# Patient Record
Sex: Male | Born: 1955 | Race: White | Hispanic: No | Marital: Married | Smoking: Former smoker
Health system: Southern US, Community
[De-identification: ages and names within clinical notes are randomized; demographics above are authoritative.]

## PROBLEM LIST (undated history)

## (undated) DIAGNOSIS — E119 Type 2 diabetes mellitus without complications: Secondary | ICD-10-CM

## (undated) DIAGNOSIS — G4730 Sleep apnea, unspecified: Secondary | ICD-10-CM

## (undated) DIAGNOSIS — F319 Bipolar disorder, unspecified: Secondary | ICD-10-CM

## (undated) DIAGNOSIS — F329 Major depressive disorder, single episode, unspecified: Secondary | ICD-10-CM

## (undated) DIAGNOSIS — F32A Depression, unspecified: Secondary | ICD-10-CM

## (undated) DIAGNOSIS — I499 Cardiac arrhythmia, unspecified: Secondary | ICD-10-CM

## (undated) DIAGNOSIS — E039 Hypothyroidism, unspecified: Secondary | ICD-10-CM

## (undated) DIAGNOSIS — G4733 Obstructive sleep apnea (adult) (pediatric): Secondary | ICD-10-CM

## (undated) DIAGNOSIS — I517 Cardiomegaly: Secondary | ICD-10-CM

## (undated) DIAGNOSIS — I10 Essential (primary) hypertension: Secondary | ICD-10-CM

## (undated) DIAGNOSIS — K7581 Nonalcoholic steatohepatitis (NASH): Secondary | ICD-10-CM

## (undated) DIAGNOSIS — E785 Hyperlipidemia, unspecified: Secondary | ICD-10-CM

## (undated) HISTORY — PX: APPENDECTOMY: SHX54

## (undated) HISTORY — PX: KNEE SURGERY: SHX244

## (undated) HISTORY — PX: COLONOSCOPY: SHX174

---

## 1995-07-12 HISTORY — PX: UVULOPALATOPHARYNGOPLASTY: SHX827

## 2002-03-21 HISTORY — PX: ESOPHAGOGASTRODUODENOSCOPY: SHX1529

## 2003-09-26 ENCOUNTER — Ambulatory Visit (HOSPITAL_COMMUNITY): Admission: RE | Admit: 2003-09-26 | Discharge: 2003-09-26 | Payer: Self-pay | Admitting: Gastroenterology

## 2005-08-27 ENCOUNTER — Inpatient Hospital Stay (HOSPITAL_COMMUNITY): Admission: EM | Admit: 2005-08-27 | Discharge: 2005-09-02 | Payer: Self-pay | Admitting: Psychiatry

## 2005-08-28 ENCOUNTER — Ambulatory Visit: Payer: Self-pay | Admitting: Psychiatry

## 2006-12-25 ENCOUNTER — Ambulatory Visit: Payer: Self-pay | Admitting: Internal Medicine

## 2009-08-04 ENCOUNTER — Ambulatory Visit: Payer: Self-pay | Admitting: Gastroenterology

## 2009-08-25 ENCOUNTER — Ambulatory Visit: Payer: Self-pay | Admitting: Gastroenterology

## 2013-01-04 ENCOUNTER — Ambulatory Visit: Payer: Self-pay | Admitting: Gastroenterology

## 2013-04-17 ENCOUNTER — Ambulatory Visit: Payer: Self-pay | Admitting: Otolaryngology

## 2013-12-01 DIAGNOSIS — G4733 Obstructive sleep apnea (adult) (pediatric): Secondary | ICD-10-CM | POA: Insufficient documentation

## 2013-12-01 DIAGNOSIS — K7581 Nonalcoholic steatohepatitis (NASH): Secondary | ICD-10-CM | POA: Insufficient documentation

## 2013-12-01 DIAGNOSIS — E039 Hypothyroidism, unspecified: Secondary | ICD-10-CM | POA: Insufficient documentation

## 2013-12-01 DIAGNOSIS — E119 Type 2 diabetes mellitus without complications: Secondary | ICD-10-CM | POA: Insufficient documentation

## 2013-12-01 DIAGNOSIS — E118 Type 2 diabetes mellitus with unspecified complications: Secondary | ICD-10-CM | POA: Insufficient documentation

## 2013-12-01 DIAGNOSIS — I119 Hypertensive heart disease without heart failure: Secondary | ICD-10-CM | POA: Insufficient documentation

## 2013-12-01 DIAGNOSIS — F319 Bipolar disorder, unspecified: Secondary | ICD-10-CM | POA: Insufficient documentation

## 2015-05-25 DIAGNOSIS — E7800 Pure hypercholesterolemia, unspecified: Secondary | ICD-10-CM | POA: Insufficient documentation

## 2015-05-25 DIAGNOSIS — Z0000 Encounter for general adult medical examination without abnormal findings: Secondary | ICD-10-CM | POA: Insufficient documentation

## 2015-09-01 ENCOUNTER — Ambulatory Visit
Admission: RE | Admit: 2015-09-01 | Discharge: 2015-09-01 | Disposition: A | Discharge status: Home / Self Care | Payer: BLUE CROSS/BLUE SHIELD | Source: Ambulatory Visit | Attending: Family Medicine | Admitting: Family Medicine

## 2015-09-01 ENCOUNTER — Other Ambulatory Visit: Payer: Self-pay | Admitting: Family Medicine

## 2015-09-01 DIAGNOSIS — R52 Pain, unspecified: Secondary | ICD-10-CM

## 2015-09-01 DIAGNOSIS — M7732 Calcaneal spur, left foot: Secondary | ICD-10-CM | POA: Insufficient documentation

## 2015-09-01 DIAGNOSIS — M79672 Pain in left foot: Secondary | ICD-10-CM | POA: Insufficient documentation

## 2015-09-07 ENCOUNTER — Ambulatory Visit (INDEPENDENT_AMBULATORY_CARE_PROVIDER_SITE_OTHER): Payer: BLUE CROSS/BLUE SHIELD

## 2015-09-07 ENCOUNTER — Encounter: Payer: Self-pay | Admitting: Podiatry

## 2015-09-07 ENCOUNTER — Ambulatory Visit (INDEPENDENT_AMBULATORY_CARE_PROVIDER_SITE_OTHER): Payer: BLUE CROSS/BLUE SHIELD | Admitting: Podiatry

## 2015-09-07 VITALS — BP 141/74 | HR 82 | Resp 12

## 2015-09-07 DIAGNOSIS — M722 Plantar fascial fibromatosis: Secondary | ICD-10-CM | POA: Diagnosis not present

## 2015-09-07 DIAGNOSIS — E119 Type 2 diabetes mellitus without complications: Secondary | ICD-10-CM

## 2015-09-07 DIAGNOSIS — Z0189 Encounter for other specified special examinations: Secondary | ICD-10-CM

## 2015-09-07 MED ORDER — MELOXICAM 15 MG PO TABS: 15.0000 mg | ORAL_TABLET | Freq: Every day | ORAL | Status: DC

## 2015-09-07 NOTE — Progress Notes (Signed)
   Subjective:    Patient ID: Carl Melton, male    DOB: [birth year], 60 y.o.   MRN: [id number]  HPI: He presents today with chief complaint of a 2 month duration of left heel pain. He states that seems to be getting worse by the end of the day rather than better. He states that he has diabetes. He denies any trauma to the left foot.    Review of Systems  Musculoskeletal: Positive for gait problem.       Objective:   Physical Exam: Vital signs are stable he is alert and oriented 3. Pulses are palpable. Neurologic sensorium is intact. Deep tendon reflexes are intact. Muscle strength intact bilateral. Orthopedic evaluation shows all joints distal to the ankle range of motion without crepitation. He has pain on palpation medial calcaneal tubercle of the left heel. Radiographs do demonstrate soft tissue increase in density of plantar fascial insertion site of the left heel. Cutaneous evaluation shows supple well-hydrated is no erythema or edema cellulitis drainage or odor.        Assessment & Plan:  Diabetes mellitus without complications plantar fasciitis left foot.  Plan: Start him on meloxicam. Injected the left heel today with Kenalog and local anesthetic. Placement of plantar fascial brace and the night splint. Discussed appropriate shoe gear stretching exercises ice therapy and she modifications. Follow up with him in 1 month.

## 2015-10-05 ENCOUNTER — Ambulatory Visit (INDEPENDENT_AMBULATORY_CARE_PROVIDER_SITE_OTHER): Payer: BLUE CROSS/BLUE SHIELD | Admitting: Podiatry

## 2015-10-05 ENCOUNTER — Encounter: Payer: Self-pay | Admitting: Podiatry

## 2015-10-05 VITALS — BP 140/79 | HR 82 | Resp 12

## 2015-10-05 DIAGNOSIS — M722 Plantar fascial fibromatosis: Secondary | ICD-10-CM | POA: Diagnosis not present

## 2015-10-05 NOTE — Progress Notes (Signed)
He presents today for follow-up of his left heel. He states that he is approximately 99% improved. He continues all conservative therapies particularly the bracing and the night splint.  Objective: Vital signs are stable he is alert and oriented 3. Pulses are palpable. No pain on palpation medial calcaneal tubercle of the left heel.  Assessment: Well-healing plantar fasciitis left.  Plan: Follow up with me on an as-needed basis continue to do everything that he has been doing for at least the next month.

## 2016-05-04 IMAGING — CR DG OS CALCIS 2+V*L*
1 series · 2 of 2 positions shown · non-contrast
Comparison: None in PACs

CLINICAL DATA: Two weeks of left heel pain without known injury;
history of diabetes.

EXAM:
LEFT OS CALCIS - 2+ VIEW

[Series 1: dg os calcis left · 0.14mm/px · 2 of 2 slices shown]
[im 1/2]
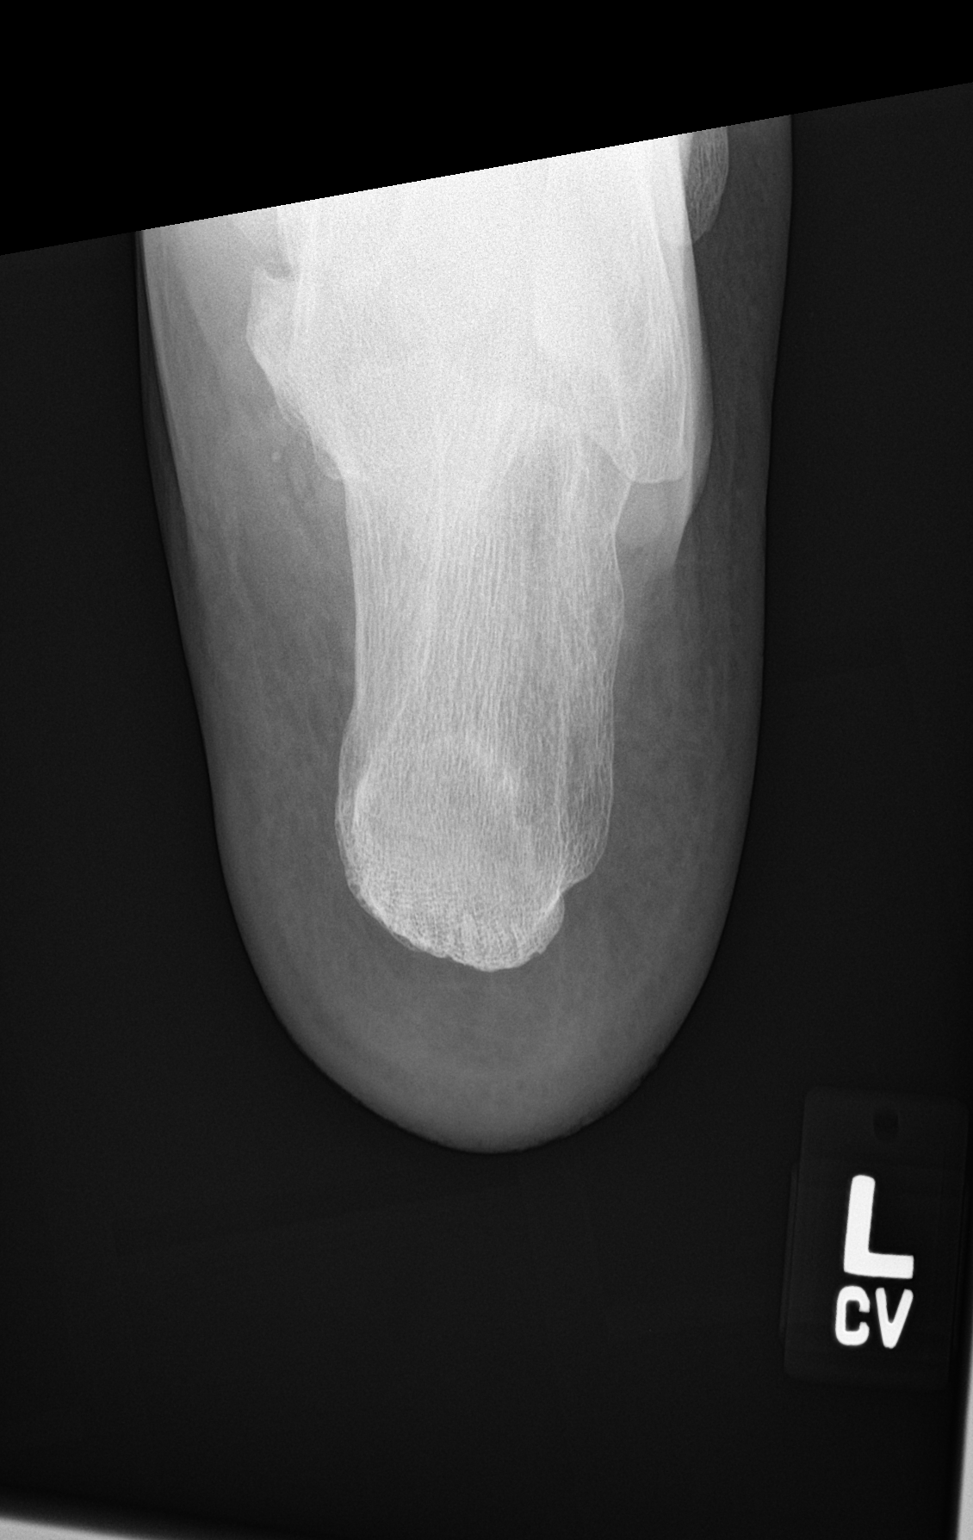
[im 2/2]
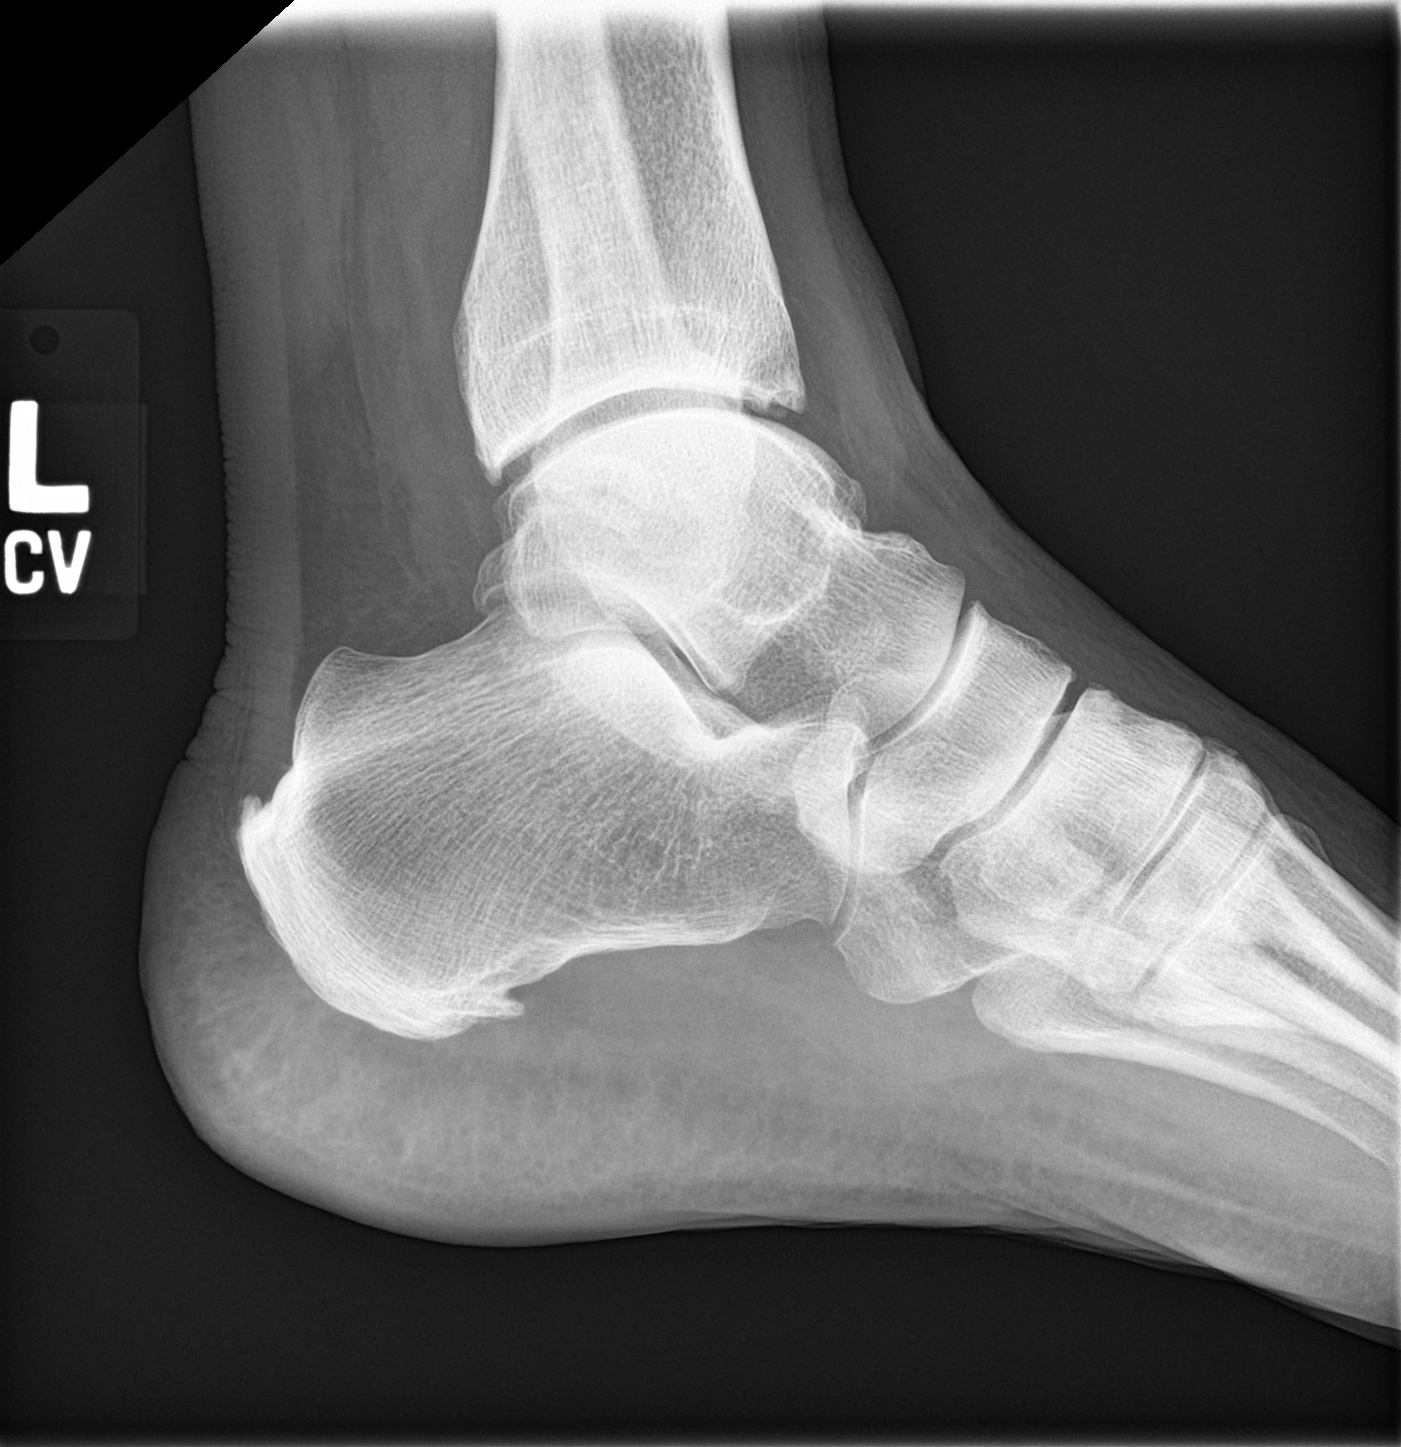

[2 of 2 positions shown; findings below may reference images not displayed]

FINDINGS: AP and lateral views of the calcaneus reveal the bone to be well
mineralized. There are small plantar and Achilles calcaneal spurs.
There is no evidence fracture. The articulation with the talus and
mid tarsal bones appears normal. The soft tissues of the heel pad
are unremarkable. There is minimal spurring of the anterior
posterior aspects of the distal tibia.
IMPRESSION: There is no acute bony abnormality of the left calcaneus. There are
small calcaneal spurs. There are no soft tissue findings to suggest
cellulitis.

## 2016-09-08 ENCOUNTER — Encounter: Payer: Self-pay | Admitting: Medical

## 2016-09-08 ENCOUNTER — Ambulatory Visit: Payer: BLUE CROSS/BLUE SHIELD | Admitting: Medical

## 2016-09-08 VITALS — BP 110/70 | HR 92 | Temp 98.5°F | Resp 16 | Ht 68.0 in | Wt 285.0 lb

## 2016-09-08 DIAGNOSIS — J0100 Acute maxillary sinusitis, unspecified: Secondary | ICD-10-CM

## 2016-09-08 MED ORDER — AMOXICILLIN-POT CLAVULANATE 875-125 MG PO TABS: 1.0000 | ORAL_TABLET | Freq: Two times a day (BID) | ORAL | 0 refills | Status: AC

## 2016-09-08 NOTE — Progress Notes (Signed)
   Subjective:     Carl Melton is a 61 y.o. male who presents for evaluation of sinus pain. Symptoms include: cough, headaches, nasal congestion, post nasal drip and sore throat. Onset of symptoms was 2 days ago. Symptoms have been unchanged since that time. Past history is significant for diabetes mellitus. Patient is a non-smoker.  The following portions of the patient's history were reviewed and updated as appropriate: allergies and current medications.  Review of Systems Constitutional: positive for chills   Objective:    General appearance: alert, cooperative and no distress Head: Normocephalic, without obvious abnormality, atraumatic Ears: normal TM's and external ear canals both ears Nose: Nares normal. Septum midline. Mucosa normal. No drainage or sinus tenderness., yellow discharge, mild congestion, turbinates red, edematous, sinus tenderness bilateral Throat: normal findings: uvula surgically removed. Lungs: clear to auscultation bilaterally Chest wall: no tenderness rrr, no mumurs rubs or gallops.   Assessment:    Acute bacterial sinusitis.    Plan:    Follow up in 3 days or as needed.

## 2016-11-14 ENCOUNTER — Encounter: Payer: Self-pay | Admitting: Medical

## 2016-11-14 ENCOUNTER — Ambulatory Visit: Payer: BLUE CROSS/BLUE SHIELD | Admitting: Medical

## 2016-11-14 VITALS — BP 122/70 | HR 86 | Temp 97.1°F | Resp 16 | Ht 68.0 in | Wt 287.0 lb

## 2016-11-14 DIAGNOSIS — J0110 Acute frontal sinusitis, unspecified: Secondary | ICD-10-CM

## 2016-11-14 MED ORDER — AMOXICILLIN 875 MG PO TABS: 875.0000 mg | ORAL_TABLET | Freq: Two times a day (BID) | ORAL | 0 refills | Status: DC

## 2016-11-14 NOTE — Progress Notes (Signed)
   Subjective:    Patient ID: Carl Melton, male    DOB: 1955/09/25, 61 y.o.   MRN: [id number]  HPI Headache on Saturday on the left forehead, Took sudafed and [facility] brand allergy relief ( not sure which one he purchased). Only clear discharge from the nose.  Used hot towel on forehead with some relief.  Took tylenol 573m tmes 2 tablets with some relief on Saturday.Today headache noticeable on Left side but better than it was on Saturday.    Review of Systems  Constitutional: Negative for chills and fatigue.  HENT: Positive for postnasal drip, sinus pain, sinus pressure and sneezing.   Respiratory: Negative for cough and shortness of breath.   Cardiovascular: Negative for chest pain.  Gastrointestinal: Positive for diarrhea. Negative for nausea and vomiting.  Endocrine: Negative for cold intolerance and heat intolerance.  Musculoskeletal: Negative for arthralgias and myalgias.  Skin: Negative for rash.  Allergic/Immunologic: Positive for environmental allergies.  Neurological: Positive for headaches. Negative for dizziness and syncope.       Objective:   Physical Exam  Constitutional: He is oriented to person, place, and time. He appears well-developed and well-nourished.  HENT:  Head: Normocephalic and atraumatic.  Right Ear: Hearing, tympanic membrane, external ear and ear canal normal.  Left Ear: Hearing, external ear and ear canal normal. A middle ear effusion is present.  Nose: Mucosal edema present.  Mouth/Throat: Oropharynx is clear and moist.  Eyes: Conjunctivae and EOM are normal. Pupils are equal, round, and reactive to light.  Pulmonary/Chest: Effort normal and breath sounds normal.  Neurological: He is alert and oriented to person, place, and time.  Skin: Skin is warm and dry.  Psychiatric: He has a normal mood and affect. His behavior is normal.  Nursing note and vitals reviewed.  Erythema L>R turbinates. s/p uvulectomy      Assessment & Plan:  Sinusitis  e-prescribed Amoxil 875 mg one by mouth twice daily x  10 days  #20 no refills. Avoid sudafed or pseudoephedrine due to it counter acting on blood pressure medication. May take OTC Coricidan Hbp or Mucinex for congestion of nose, take as directed. Use  OTC Tylenol take as directed if needed for headache. Return to the clinic if not improving in 3-5 days on antibiotics.

## 2017-01-23 ENCOUNTER — Ambulatory Visit: Payer: BLUE CROSS/BLUE SHIELD | Admitting: Medical

## 2017-01-23 ENCOUNTER — Encounter: Payer: Self-pay | Admitting: Medical

## 2017-01-23 VITALS — BP 126/68 | HR 86 | Temp 98.9°F | Wt 289.0 lb

## 2017-01-23 DIAGNOSIS — J069 Acute upper respiratory infection, unspecified: Secondary | ICD-10-CM

## 2017-01-23 DIAGNOSIS — R05 Cough: Secondary | ICD-10-CM

## 2017-01-23 DIAGNOSIS — R059 Cough, unspecified: Secondary | ICD-10-CM

## 2017-01-23 MED ORDER — BENZONATATE 100 MG PO CAPS: 100.0000 mg | ORAL_CAPSULE | Freq: Three times a day (TID) | ORAL | 0 refills | Status: DC | PRN

## 2017-01-23 MED ORDER — DOXYCYCLINE HYCLATE 100 MG PO TABS
100.0000 mg | ORAL_TABLET | Freq: Two times a day (BID) | ORAL | 0 refills | Status: DC
Start: 2017-01-23 — End: 2017-11-24

## 2017-01-23 NOTE — Progress Notes (Signed)
   Subjective:    Patient ID: Carl Melton, male    DOB: [birth year], 61 y.o.   MRN: [id number]  HPI 61 yo male comes in with  3 week cough, productive green and white.  No fever or chills. Sleeps with CPAP  Has still been able to use it. Tried Mucinex for cough couldn't tell a difference, took for up to one week. Thought initially he would get better but with it occurring  for 3 weeks thought he should see someone.   Review of Systems  Constitutional: Negative for chills and fever.  HENT: Negative for congestion, ear pain, rhinorrhea and sore throat.   Eyes: Negative for discharge and itching.  Respiratory: Positive for cough and shortness of breath.   Cardiovascular: Negative for chest pain.  Gastrointestinal: Negative for abdominal pain.  Genitourinary: Negative for hematuria.  Musculoskeletal: Negative for back pain.  Skin: Negative for rash.  Neurological: Negative for dizziness and syncope.  Hematological: Negative for adenopathy.  Psychiatric/Behavioral: Negative for confusion and hallucinations.  Had right sided shoulder pain x 2 days continued to work and felt better.     Objective:   Physical Exam  Constitutional: He is oriented to person, place, and time. He appears well-developed and well-nourished.  HENT:  Head: Normocephalic and atraumatic.  Right Ear: External ear normal.  Left Ear: External ear normal.  Nose: Nose normal.  Mouth/Throat: Oropharynx is clear and moist.  Eyes: Pupils are equal, round, and reactive to light. Conjunctivae and EOM are normal.  Neck: Normal range of motion. Neck supple.  Cardiovascular: Normal rate, regular rhythm and normal heart sounds.  Exam reveals no gallop and no friction rub.   No murmur heard. Pulmonary/Chest: Effort normal and breath sounds normal.  Musculoskeletal: Normal range of motion.  Neurological: He is alert and oriented to person, place, and time.  Skin: Skin is warm and dry.  Psychiatric: He has a normal mood and  affect. His behavior is normal. Judgment and thought content normal.  Nursing note and vitals reviewed.   No cough noted in room.      Assessment & Plan:  Upper Respiratory Infection, Cough Doxy 100 mg one by mouth twice daily for 10 days #20 no refills, warned patient of sun burn to avoid sun or use a hat and 30 + SPF Benzonatate Perles 100 mg one by mouth three times a day as needed for cough #21 no rx Return to the clinic in  3-5 days if not improving.

## 2017-01-23 NOTE — Patient Instructions (Signed)
Upper  Tos en los adultos (Cough, Adult) La tos ayuda a limpiar la garganta y los pulmones. La tos puede durar solo 2 o 3semanas (aguda) o ms de 8semanas (crnica). Las causas de la tos son [place]. Puede ser el signo de [place] enfermedad o de otro trastorno. CUIDADOS EN EL HOGAR  Est atento a cualquier cambio en la tos.  Tome los medicamentos solamente como se lo haya indicado el mdico. ? Si le recetaron un antibitico, tmelo como se lo haya indicado el mdico. No deje de tomarlo aunque comience a sentirse mejor. ? Hable con el mdico antes de probar un medicamento para la tos.  Beba suficiente lquido para mantener el pis (orina) claro o de color [place] plido.  Si el aire est seco, use un vaporizador o un humidificador con vapor fro en su casa.  Mantngase alejado de las cosas que lo hacen toser en el trabajo o en su casa.  Si la tos aumenta durante la noche, haga la prueba de usar almohadas adicionales para Theatre manager la cabeza ms elevada mientras duerme.  No fume e intente no estar cerca de humo. Si necesita ayuda para dejar de fumar, consulte al mdico.  No consuma cafena.  No beba alcohol.  Descanse todo lo que sea necesario. SOLICITE AYUDA SI:  Le aparecen problemas (sntomas) nuevos.  Expectora un lquido amarillento (pus) cuando tose.  La tos no mejora despus de 2 o 3semanas, o empeora.  Los medicamentos no [employer] tos, y no Programmer, applications.  Siente un dolor que se vuelve ms intenso o que no se [employer].  Tiene fiebre.  Est bajando de peso y no sabe por qu.  Tiene transpiracin nocturna. SOLICITE AYUDA DE INMEDIATO SI:  Tose y escupe sangre.  Tiene dificultad para respirar.  Los latidos cardacos son muy rpidos. Esta informacin no tiene Marine scientist el consejo del mdico. Asegrese de hacerle al mdico cualquier pregunta que tenga. Document Released: 03/10/2011 Document Revised: 03/18/2015 Document Reviewed:  09/03/2014 Elsevier Interactive Patient Education  2018 [place].  Upper Respiratory Infection, Adult Most upper respiratory infections (URIs) are caused by a virus. A URI affects the nose, throat, and upper air passages. The most common type of URI is often called "the common cold." Follow these instructions at home:  Take medicines only as told by your doctor.  Gargle warm saltwater or take cough drops to comfort your throat as told by your doctor.  Use a warm mist humidifier or inhale steam from a shower to increase air moisture. This may make it easier to breathe.  Drink enough fluid to keep your pee (urine) clear or pale yellow.  Eat soups and other clear broths.  Have a healthy diet.  Rest as needed.  Go back to work when your fever is gone or your doctor says it is okay. ? You may need to stay home longer to avoid giving your URI to others. ? You can also wear a face mask and wash your hands often to prevent spread of the virus.  Use your inhaler more if you have asthma.  Do not use any tobacco products, including cigarettes, chewing tobacco, or electronic cigarettes. If you need help quitting, ask your doctor. Contact a doctor if:  You are getting worse, not better.  Your symptoms are not helped by medicine.  You have chills.  You are getting more short of breath.  You have brown or red mucus.  You have yellow or  brown discharge from your nose.  You have pain in your face, especially when you bend forward.  You have a fever.  You have puffy (swollen) neck glands.  You have pain while swallowing.  You have white areas in the back of your throat. Get help right away if:  You have very bad or constant: ? Headache. ? Ear pain. ? Pain in your forehead, behind your eyes, and over your cheekbones (sinus pain). ? Chest pain.  You have long-lasting (chronic) lung disease and any of the following: ? Wheezing. ? Long-lasting cough. ? Coughing up  blood. ? A change in your usual mucus.  You have a stiff neck.  You have changes in your: ? Vision. ? Hearing. ? Thinking. ? Mood. This information is not intended to replace advice given to you by your health care provider. Make sure you discuss any questions you have with your health care provider. Document Released: 12/14/2007 Document Revised: 02/28/2016 Document Reviewed: 10/02/2013 Elsevier Interactive Patient Education  2018 [employer].

## 2017-05-23 ENCOUNTER — Ambulatory Visit: Payer: BLUE CROSS/BLUE SHIELD | Attending: Otolaryngology

## 2017-05-23 DIAGNOSIS — G4733 Obstructive sleep apnea (adult) (pediatric): Secondary | ICD-10-CM | POA: Diagnosis not present

## 2017-11-24 ENCOUNTER — Encounter: Payer: Self-pay | Admitting: Adult Health

## 2017-11-24 ENCOUNTER — Ambulatory Visit: Payer: BLUE CROSS/BLUE SHIELD | Admitting: Adult Health

## 2017-11-24 VITALS — BP 146/70 | HR 72 | Temp 98.2°F | Resp 16 | Ht 68.0 in | Wt 287.0 lb

## 2017-11-24 DIAGNOSIS — J0110 Acute frontal sinusitis, unspecified: Secondary | ICD-10-CM

## 2017-11-24 DIAGNOSIS — J3089 Other allergic rhinitis: Secondary | ICD-10-CM

## 2017-11-24 MED ORDER — FLUTICASONE PROPIONATE 50 MCG/ACT NA SUSP: 2.0000 | Freq: Every day | NASAL | 1 refills | Status: DC

## 2017-11-24 MED ORDER — AMOXICILLIN-POT CLAVULANATE 875-125 MG PO TABS: 1.0000 | ORAL_TABLET | Freq: Two times a day (BID) | ORAL | 0 refills | Status: DC

## 2017-11-24 NOTE — Progress Notes (Signed)
Subjective:     Patient ID: Carl Melton, male   DOB: Jul 16, 1955, 62 y.o.   MRN: [id number]  HPI   Blood pressure (!) 146/70, pulse 72, temperature 98.2 F (36.8 C), resp. rate 16, height 5' 8"  (1.727 m), weight 287 lb (130.2 kg), SpO2 96 %. Patient is a 62 year old male who comes to the clinic with sore throat, sinus congestion.  Runny nose. Sinus pressure forehead x 2 weeks.   Patient  denies any fever, body aches,chills, rash, chest pain, shortness of breath, nausea, vomiting, or diarrhea.  Denies chest pain. Denies any shortness breath.   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   Kirk Ruths, MD   Review of Systems  Constitutional: Negative for activity change, appetite change, chills, diaphoresis, fatigue, fever and unexpected weight change.  HENT: Positive for congestion, ear pain, postnasal drip, rhinorrhea, sinus pressure and sore throat. Negative for dental problem, drooling, ear discharge, facial swelling, hearing loss, mouth sores, nosebleeds, sinus pain, sneezing, tinnitus, trouble swallowing and voice change.   Eyes: Positive for discharge (bilateral itchy watery eyes) and itching.  Respiratory: Negative.   Cardiovascular: Negative.   Gastrointestinal: Negative.   Genitourinary: Negative.   Musculoskeletal: Negative.   Skin: Negative.   Neurological: Negative.   Hematological: Negative.   Psychiatric/Behavioral: Negative.        Controlled bipolar per patient       Objective:   Physical Exam  Constitutional: He is oriented to person, place, and time. He appears well-developed and well-nourished. He is active.  Non-toxic appearance. He does not have a sickly appearance. He does not appear ill. No distress.  Morbid obesity   Patient is alert and oriented and responsive to questions Engages in eye contact with provider. Speaks in full sentences without any pauses without any shortness of breath.   Patient moves on and off of exam table and in room  without difficulty. Gait is normal in hall and in room. Patient is oriented to person place time and situation. Patient answers questions appropriately and engages in conversation.   HENT:  Head: Normocephalic and atraumatic.  Right Ear: Hearing and external ear normal. No drainage, swelling or tenderness.  Left Ear: Hearing and external ear normal. No drainage, swelling or tenderness.  Mouth/Throat: Uvula is midline and mucous membranes are normal. Mucous membranes are not pale, not dry and not cyanotic. No oral lesions. No uvula swelling. Posterior oropharyngeal erythema present. No oropharyngeal exudate, posterior oropharyngeal edema or tonsillar abscesses. Tonsils are 0 on the right. Tonsils are 0 on the left.  Unable to visualize the bilateral tympanic membrane due to dark cerumen impaction- appears to be hard wax. Unable  To visualize full canal bilaterally.   Denies drainage- reports bilateral ear fullness.  Reports hearing " the same as always  " denies any   Eyes: Pupils are equal, round, and reactive to light. EOM are normal.  Neck: Normal range of motion. No thyromegaly present.  Cardiovascular: Normal rate, regular rhythm, normal heart sounds and intact distal pulses. Exam reveals no gallop and no friction rub.  No murmur heard. Pulmonary/Chest: Effort normal and breath sounds normal. No stridor. No respiratory distress. He has no wheezes. He has no rhonchi. He has no rales. He exhibits no tenderness.  Abdominal: Soft. Bowel sounds are normal.  Lymphadenopathy:    He has no cervical adenopathy.  Neurological: He is alert and oriented to person, place, and time.  Skin: Skin is warm and dry. Capillary refill  takes less than 2 seconds.  Psychiatric: He has a normal mood and affect. His behavior is normal.  Vitals reviewed.      Assessment:     Acute non-recurrent frontal sinusitis  Seasonal allergic rhinitis due to other allergic trigger   Plan:     Debrox over the counter  bilateral ears for cerumen impaction- may call for follow up ear irrigation bilaterally if prefers after using Debrox and when not sick.   Start Claritin per package instructions daily- clear with Kirk Ruths, MD  Meds ordered this encounter  Medications  . amoxicillin-clavulanate (AUGMENTIN) 875-125 MG tablet    Sig: Take 1 tablet by mouth 2 (two) times daily.    Dispense:  20 tablet    Refill:  0  . fluticasone (FLONASE) 50 MCG/ACT nasal spray    Sig: Place 2 sprays into both nostrils daily.    Dispense:  16 g    Refill:  1   Advised patient call the office or your primary care doctor for an appointment if no improvement within 72 hours or if any symptoms change or worsen at any time  Advised ER or urgent Care if after hours or on weekend. Call 911 for emergency symptoms at any time.Patinet verbalized understanding of all instructions given/reviewed and treatment plan and has no further questions or concerns at this time.    Patient verbalized understanding of all instructions given and denies any further questions at this time.

## 2017-11-24 NOTE — Patient Instructions (Signed)
Sinusitis, Adult Sinusitis is soreness and inflammation of your sinuses. Sinuses are hollow spaces in the bones around your face. They are located:  Around your eyes.  In the middle of your forehead.  Behind your nose.  In your cheekbones.  Your sinuses and nasal passages are lined with a stringy fluid (mucus). Mucus normally drains out of your sinuses. When your nasal tissues get inflamed or swollen, the mucus can get trapped or blocked so air cannot flow through your sinuses. This lets bacteria, viruses, and funguses grow, and that leads to infection. Follow these instructions at home: Medicines  Take, use, or apply over-the-counter and prescription medicines only as told by your doctor. These may include nasal sprays.  If you were prescribed an antibiotic medicine, take it as told by your doctor. Do not stop taking the antibiotic even if you start to feel better. Hydrate and Humidify  Drink enough water to keep your pee (urine) clear or pale yellow.  Use a cool mist humidifier to keep the humidity level in your home above 50%.  Breathe in steam for 10-15 minutes, 3-4 times a day or as told by your doctor. You can do this in the bathroom while a hot shower is running.  Try not to spend time in cool or dry air. Rest  Rest as much as possible.  Sleep with your head raised (elevated).  Make sure to get enough sleep each night. General instructions  Put a warm, moist washcloth on your face 3-4 times a day or as told by your doctor. This will help with discomfort.  Wash your hands often with soap and water. If there is no soap and water, use hand sanitizer.  Do not smoke. Avoid being around people who are smoking (secondhand smoke).  Keep all follow-up visits as told by your doctor. This is important. Contact a doctor if:  You have a fever.  Your symptoms get worse.  Your symptoms do not get better within 10 days. Get help right away if:  You have a very bad  headache.  You cannot stop throwing up (vomiting).  You have pain or swelling around your face or eyes.  You have trouble seeing.  You feel confused.  Your neck is stiff.  You have trouble breathing. This information is not intended to replace advice given to you by your health care provider. Make sure you discuss any questions you have with your health care provider. Document Released: 12/14/2007 Document Revised: 02/21/2016 Document Reviewed: 04/22/2015 Elsevier Interactive Patient Education  2018 [place], Adult The ears produce a substance called earwax that helps keep bacteria out of the ear and protects the skin in the ear canal. Occasionally, earwax can build up in the ear and cause discomfort or hearing loss. What increases the risk? This condition is more likely to develop in people who:  Are male.  Are elderly.  Naturally produce more earwax.  Clean their ears often with cotton swabs.  Use earplugs often.  Use in-ear headphones often.  Wear hearing aids.  Have narrow ear canals.  Have earwax that is overly thick or sticky.  Have eczema.  Are dehydrated.  Have excess hair in the ear canal.  What are the signs or symptoms? Symptoms of this condition include:  Reduced or muffled hearing.  A feeling of fullness in the ear or feeling that the ear is plugged.  Fluid coming from the ear.  Ear pain.  Ear itch.  Ringing in the ear.  Coughing.  An obvious piece of earwax that can be seen inside the ear canal.  How is this diagnosed? This condition may be diagnosed based on:  Your symptoms.  Your medical history.  An ear exam. During the exam, your health care provider will look into your ear with an instrument called an otoscope.  You may have tests, including a hearing test. How is this treated? This condition may be treated by:  Using ear drops to soften the earwax.  Having the earwax removed by a health care  provider. The health care provider may: ? Flush the ear with water. ? Use an instrument that has a loop on the end (curette). ? Use a suction device.  Surgery to remove the wax buildup. This may be done in severe cases.  Follow these instructions at home:  Take over-the-counter and prescription medicines only as told by your health care provider.  Do not put any objects, including cotton swabs, into your ear. You can clean the opening of your ear canal with a washcloth or facial tissue.  Follow instructions from your health care provider about cleaning your ears. Do not over-clean your ears.  Drink enough fluid to keep your urine clear or pale yellow. This will help to thin the earwax.  Keep all follow-up visits as told by your health care provider. If earwax builds up in your ears often or if you use hearing aids, consider seeing your health care provider for routine, preventive ear cleanings. Ask your health care provider how often you should schedule your cleanings.  If you have hearing aids, clean them according to instructions from the manufacturer and your health care provider. Contact a health care provider if:  You have ear pain.  You develop a fever.  You have blood, pus, or other fluid coming from your ear.  You have hearing loss.  You have ringing in your ears that does not go away.  Your symptoms do not improve with treatment.  You feel like the room is spinning (vertigo). Summary  Earwax can build up in the ear and cause discomfort or hearing loss.  The most common symptoms of this condition include reduced or muffled hearing and a feeling of fullness in the ear or feeling that the ear is plugged.  This condition may be diagnosed based on your symptoms, your medical history, and an ear exam.  This condition may be treated by using ear drops to soften the earwax or by having the earwax removed by a health care provider.  Do not put any objects, including  cotton swabs, into your ear. You can clean the opening of your ear canal with a washcloth or facial tissue. This information is not intended to replace advice given to you by your health care provider. Make sure you discuss any questions you have with your health care provider. Document Released: 08/04/2004 Document Revised: 09/07/2016 Document Reviewed: 09/07/2016 Elsevier Interactive Patient Education  [employer].

## 2017-12-14 ENCOUNTER — Ambulatory Visit: Payer: BLUE CROSS/BLUE SHIELD | Admitting: Adult Health

## 2017-12-14 VITALS — BP 151/72 | HR 77 | Temp 99.1°F | Resp 18 | Wt 292.6 lb

## 2017-12-14 DIAGNOSIS — H109 Unspecified conjunctivitis: Secondary | ICD-10-CM

## 2017-12-14 DIAGNOSIS — B9689 Other specified bacterial agents as the cause of diseases classified elsewhere: Secondary | ICD-10-CM

## 2017-12-14 DIAGNOSIS — J309 Allergic rhinitis, unspecified: Secondary | ICD-10-CM

## 2017-12-14 DIAGNOSIS — H1013 Acute atopic conjunctivitis, bilateral: Secondary | ICD-10-CM

## 2017-12-14 DIAGNOSIS — H6123 Impacted cerumen, bilateral: Secondary | ICD-10-CM

## 2017-12-14 MED ORDER — POLYMYXIN B-TRIMETHOPRIM 10000-0.1 UNIT/ML-% OP SOLN: 1.0000 [drp] | Freq: Two times a day (BID) | OPHTHALMIC | 0 refills | Status: AC

## 2017-12-14 MED ORDER — LORATADINE 10 MG PO TABS: 10.0000 mg | ORAL_TABLET | Freq: Every day | ORAL | 1 refills | Status: DC

## 2017-12-14 NOTE — Progress Notes (Signed)
1445- Ear wash performed on ears bilaterally x2 as requested by M. Flinchum NP. Small amount of wax flushed from left ear, large amount from right ear. By the 2nd wash for both ears water was clear.

## 2017-12-14 NOTE — Progress Notes (Signed)
Subjective:     Patient ID: Carl Melton, male   DOB: 15-May-1956, 62 y.o.   MRN: [id number]  HPI  Blood pressure (!) 151/72, pulse 77, temperature 99.1 F (37.3 C), temperature source Tympanic, resp. rate 18, weight 292 lb 9.6 oz (132.7 kg), SpO2 95 %.  Patient is a 62 year old male in no acute distress who returns to the clinic to have his ears rechecked and possible ear lavage. He has been using Debrox since completing his antibiotic.   He reports he finished his antibiotic for sinusitis and his symptoms resolved. He reports he feels much better.   He does report bilateral itchy eyes, with mucous upon awakening worse in past 3 to 4 days.  Denies any eye pain or photophobia. Denies any trauma or injury to eye. Denies any foreign body or chemical. Denies any vision change.   Patient  denies any fever, body aches,chills, rash, chest pain, shortness of breath, nausea, vomiting, or diarrhea.    Review of Systems  Constitutional: Negative.   HENT: Positive for ear discharge (cerumen coming out after using Debrox per patient) and rhinorrhea. Negative for congestion, dental problem, drooling, ear pain, facial swelling, hearing loss, mouth sores, nosebleeds, postnasal drip, sinus pressure, sinus pain, sneezing, sore throat, tinnitus, trouble swallowing and voice change.   Eyes: Positive for redness (mild pink bilaterally ) and itching. Negative for photophobia, pain, discharge and visual disturbance.  Respiratory: Negative.   Cardiovascular: Negative.   Gastrointestinal: Negative.   Endocrine: Negative.   Genitourinary: Negative.   Musculoskeletal: Negative.   Skin: Negative.   Allergic/Immunologic: Negative.   Neurological: Negative.   Hematological: Negative.   Psychiatric/Behavioral: Negative.        Objective:   Physical Exam  Constitutional: He is oriented to person, place, and time. He appears well-developed and well-nourished. He is active.  Non-toxic appearance. He does not  have a sickly appearance. He does not appear ill. No distress.  Soft wax bilateral ear impaction on initial exam.   After ear flush able to clearly visualize normal tympanic membrane.   Patient tolerated lavage without any difficulty per [facility].   HENT:  Head: Normocephalic and atraumatic.  Right Ear: Hearing, tympanic membrane, external ear and ear canal normal. Tympanic membrane is not perforated and not erythematous. No middle ear effusion.  Left Ear: Hearing, tympanic membrane, external ear and ear canal normal. Tympanic membrane is not perforated and not erythematous.  No middle ear effusion.  Nose: Nose normal.  Mouth/Throat: Oropharynx is clear and moist. No oropharyngeal exudate.  Patient is alert and oriented and responsive to questions Engages in eye contact with provider. Speaks in full sentences without any pauses without any shortness of breath or distress.   Cobblestoning posterior pharynx; bilateral allergic shiners; bilateral TMs air fluid level clear; bilateral nasal turbinates no edema clear discharge;     Eyes: Pupils are equal, round, and reactive to light. EOM and lids are normal. Right eye exhibits exudate. Right eye exhibits no chemosis, no discharge and no hordeolum. No foreign body present in the right eye. Left eye exhibits exudate (mild yellow discharge bilateral upper and lower lashes nasal side ). Left eye exhibits no chemosis, no discharge and no hordeolum. No foreign body present in the left eye. Right conjunctiva is injected. Right conjunctiva has no hemorrhage. Left conjunctiva is injected (mild bilateral pink injection ). Left conjunctiva has no hemorrhage. No scleral icterus.  Neck: Trachea normal, normal range of motion and phonation normal.  Neck supple. Normal carotid pulses, no hepatojugular reflux and no JVD present. No tracheal tenderness present. Carotid bruit is not present. No tracheal deviation present. No Brudzinski's sign noted.   Cardiovascular: Normal rate, regular rhythm, normal heart sounds and intact distal pulses. Exam reveals no gallop and no friction rub.  No murmur heard. Pulmonary/Chest: Effort normal and breath sounds normal. No accessory muscle usage or stridor. No respiratory distress. He has no wheezes. He has no rales. He exhibits no tenderness.  Abdominal: Soft. Bowel sounds are normal.  Musculoskeletal: Normal range of motion.  Lymphadenopathy:       Head (right side): No submental, no submandibular, no tonsillar, no preauricular, no posterior auricular and no occipital adenopathy present.       Head (left side): No submental, no submandibular, no tonsillar, no preauricular, no posterior auricular and no occipital adenopathy present.    He has no cervical adenopathy.  Neurological: He is alert and oriented to person, place, and time. He displays normal reflexes. No cranial nerve deficit. He exhibits normal muscle tone. Coordination normal.  Patient moves on and off of exam table and in room without difficulty. Gait is normal in hall and in room. Patient is oriented to person place time and situation. Patient answers questions appropriately and engages in conversation.   Skin: Skin is warm and dry. Capillary refill takes less than 2 seconds. No rash noted. He is not diaphoretic. No erythema. No pallor.  Psychiatric: He has a normal mood and affect. His behavior is normal. Judgment and thought content normal.  Vitals reviewed.      Assessment:     Bacterial conjunctivitis of both eyes  Cerumen debris on tympanic membrane of both ears - Plan: Ear Lavage  Allergic conjunctivitis of both eyes and rhinitis      Plan:        Meds ordered this encounter  Medications  . loratadine (CLARITIN) 10 MG tablet    Sig: Take 1 tablet (10 mg total) by mouth daily.    Dispense:  30 tablet    Refill:  1  . trimethoprim-polymyxin b (POLYTRIM) ophthalmic solution    Sig: Place 1 drop into both eyes 2 (two)  times daily for 10 days.    Dispense:  10 mL    Refill:  0   Continue Debrox per package instructions.  See eye doctor for annual eye exam within 1-2 months.  He reports he goes yearly. Denies any chronic eye problems.   AVS reviewed.  Advised patient call the office or your primary care doctor for an appointment if no improvement within 72 hours or if any symptoms change or worsen at any time  Advised ER or urgent Care if after hours or on weekend. Call 911 for emergency symptoms at any time.Patinet verbalized understanding of all instructions given/reviewed and treatment plan and has no further questions or concerns at this time.    Patient verbalized understanding of all instructions given and denies any further questions at this time.

## 2017-12-14 NOTE — Patient Instructions (Signed)
Polymyxin B; Trimethoprim eye drops, solution What is this medicine? POLYMYXIN B and TRIMETHOPRIM (pol i MIX in B and trye METH oh prim) eye drops treat certain eye infections caused by bacteria. This medicine may be used for other purposes; ask your health care provider or pharmacist if you have questions. COMMON BRAND NAME(S): Polytrim What should I tell my health care provider before I take this medicine? They need to know if you have any of these conditions: -wear contact lenses -an unusual or allergic reaction to polymyxin B, trimethoprim, other medicines, foods, dyes, or preservatives -pregnant or trying to get pregnant -breast-feeding How should I use this medicine? This medicine is used in the eye. Follow the directions on the prescription label. Wash your hands before and after use. Tilt your head back slightly. Pull your lower eyelid down gently to form a pouch. Do not touch the tip of the dropper to your eye, fingertips, or other surface. Squeeze the prescribed number of drops into the pouch. Close the eye gently to spread the drops. Use your medicine at regular intervals. Do not take your medicine more often than directed. Use all of your medicine as directed even if you think your are better. Do not skip doses or stop your medicine early. Talk to your pediatrician regarding the use of this medicine in children. While this drug may be prescribed for children and infants for selected conditions, precautions do apply. Overdosage: If you think you have taken too much of this medicine contact a poison control center or emergency room at once. NOTE: This medicine is only for you. Do not share this medicine with others. What if I miss a dose? If you miss a dose, use it as soon as you can. If it is almost time for your next dose, use only that dose. Do not use double or extra doses. What may interact with this medicine? Interactions are not expected. Do not use any other eye products without  advice of your doctor or health care professional. This list may not describe all possible interactions. Give your health care provider a list of all the medicines, herbs, non-prescription drugs, or dietary supplements you use. Also tell them if you smoke, drink alcohol, or use illegal drugs. Some items may interact with your medicine. What should I watch for while using this medicine? Check with your doctor or health care professional if your condition does not get better after 5 days, or if it gets worse. If you wear contact lenses, ask when you can use your lenses again. A burning or stinging reaction that does not go away may mean you are allergic to this product. Stop use and call your doctor or health care professional. To prevent the spread of infection, do not share eye products or other personal items with anyone else. What side effects may I notice from receiving this medicine? Side effects that you should report to your doctor or health care professional as soon as possible: -burning, stinging, or swelling -change in vision or blurred vision that will not go away -eye pain -itching and redness -rash Side effects that usually do not require medical attention (report to your doctor or health care professional if they continue or are bothersome): -temporary blurred vision after applying -temporary watering or stinging This list may not describe all possible side effects. Call your doctor for medical advice about side effects. You may report side effects to FDA at 1-800-FDA-1088. Where should I keep my medicine? Keep out of the  reach of children. Store at room temperature 15 to 25 degrees C (59 to 77 degrees F). Protect from light. To prevent the spread of infection, it is best to throw away any unused eye drops after you finish the course of treatment. Throw away any unused medicine after the expiration date. NOTE: This sheet is a summary. It may not cover all possible information. If  you have questions about this medicine, talk to your doctor, pharmacist, or health care provider.  2018 Elsevier/Gold Standard (2008-01-29 11:36:42) Loratadine tablets What is this medicine? LORATADINE (lor AT a deen) is an antihistamine. It helps to relieve sneezing, runny nose, and itchy, watery eyes. This medicine is used to treat the symptoms of allergies. It is also used to treat itchy skin rash and hives. This medicine may be used for other purposes; ask your health care provider or pharmacist if you have questions. COMMON BRAND NAME(S): Alavert, Allergy Relief, Claritin, Claritin Hives Relief, Clear-Atadine, QlearQuil All Day & All Night Allergy Relief, Tavist ND What should I tell my health care provider before I take this medicine? They need to know if you have any of these conditions: -asthma -kidney disease -liver disease -an unusual or allergic reaction to loratadine, other antihistamines, other medicines, foods, dyes, or preservatives -pregnant or trying to get pregnant -breast-feeding How should I use this medicine? Take this medicine by mouth with a glass of water. Follow the directions on the label. You may take this medicine with food or on an empty stomach. Take your medicine at regular intervals. Do not take your medicine more often than directed. Talk to your pediatrician regarding the use of this medicine in children. While this medicine may be used in children as young as 6 years for selected conditions, precautions do apply. Overdosage: If you think you have taken too much of this medicine contact a poison control center or emergency room at once. NOTE: This medicine is only for you. Do not share this medicine with others. What if I miss a dose? If you miss a dose, take it as soon as you can. If it is almost time for your next dose, take only that dose. Do not take double or extra doses. What may interact with this medicine? -other medicines for colds or allergies This  list may not describe all possible interactions. Give your health care provider a list of all the medicines, herbs, non-prescription drugs, or dietary supplements you use. Also tell them if you smoke, drink alcohol, or use illegal drugs. Some items may interact with your medicine. What should I watch for while using this medicine? Tell your doctor or healthcare professional if your symptoms do not start to get better or if they get worse. Your mouth may get dry. Chewing sugarless gum or sucking hard candy, and drinking plenty of water may help. Contact your doctor if the problem does not go away or is severe. You may get drowsy or dizzy. Do not drive, use machinery, or do anything that needs mental alertness until you know how this medicine affects you. Do not stand or sit up quickly, especially if you are an older patient. This reduces the risk of dizzy or fainting spells. What side effects may I notice from receiving this medicine? Side effects that you should report to your doctor or health care professional as soon as possible: -allergic reactions like skin rash, itching or hives, swelling of the face, lips, or tongue -breathing problems -unusually restless or nervous Side effects that usually  do not require medical attention (report to your doctor or health care professional if they continue or are bothersome): -drowsiness -dry or irritated mouth or throat -headache This list may not describe all possible side effects. Call your doctor for medical advice about side effects. You may report side effects to FDA at 1-800-FDA-1088. Where should I keep my medicine? Keep out of the reach of children. Store at room temperature between 2 and 30 degrees C (36 and 86 degrees F). Protect from moisture. Throw away any unused medicine after the expiration date. NOTE: This sheet is a summary. It may not cover all possible information. If you have questions about this medicine, talk to your doctor,  pharmacist, or health care provider.  2018 Elsevier/Gold Standard (2007-12-31 17:17:24) Nasal Allergies Nasal allergies are a reaction to allergens in the air. Allergens are tiny specks (particles) in the air that cause your body to have an allergic reaction. Nasal allergies are not passed from person to person (contagious). They cannot be cured, but they can be controlled. Common causes of nasal allergies include:  Pollen from grasses, trees, and weeds.  House dust mites.  Pet dander.  Mold.  Follow these instructions at home:  Avoid the allergen that is causing your symptoms, if you can.  Keep windows closed. If possible, use air conditioning when there is a lot of pollen in the air.  Do not use fans in your home.  Do not hang clothes outside to dry.  Wear sunglasses to keep pollen out of your eyes.  Wash your hands right away after you touch household pets.  Take over-the-counter and prescription medicines only as told by your doctor.  Keep all follow-up visits as told by your doctor. This is important. Contact a doctor if:  You have a fever.  You have a cough that does not go away (is persistent).  You start to make whistling sounds when you breathe (wheeze).  Your symptoms do not get better with treatment.  You have thick fluid coming from your nose.  You start to have nosebleeds. Get help right away if:  Your tongue or your lips are swollen.  You have trouble breathing.  You feel light-headed or you feel like you are going to pass out (faint).  You have cold sweats. This information is not intended to replace advice given to you by your health care provider. Make sure you discuss any questions you have with your health care provider. Document Released: 10/27/2010 Document Revised: 12/03/2015 Document Reviewed: 01/07/2015 Elsevier Interactive Patient Education  2018 [employer]. Allergic Conjunctivitis A clear membrane (conjunctiva) covers the white  part of your eye and the inner surface of your eyelid. Allergic conjunctivitis happens when this membrane has inflammation. This is caused by allergies. Common causes of allergic reactions (allergens)include:  Outdoor allergens, such as: ? Pollen. ? Grass and weeds. ? Mold spores.  Indoor allergens, such as: ? Dust. ? Smoke. ? Mold. ? Pet dander. ? Animal hair.  This condition can make your eye red or pink. It can also make your eye feel itchy. This condition cannot be spread from one person to another person (is not contagious). Follow these instructions at home:  Try not to be around things that you are allergic to.  Take or apply over-the-counter and prescription medicines only as told by your doctor. These include any eye drops.  Place a cool, clean washcloth on your eye for 10-20 minutes. Do this 3-4 times a day.  Do not touch  or rub your eyes.  Do not wear contact lenses until the inflammation is gone. Wear glasses instead.  Do not wear eye makeup until the inflammation is gone.  Keep all follow-up visits as told by your doctor. This is important. Contact a doctor if:  Your symptoms get worse.  Your symptoms do not get better with treatment.  You have mild eye pain.  You are sensitive to light,  You have spots or blisters on your eyes.  You have pus coming from your eye.  You have a fever. Get help right away if:  You have redness, swelling, or other symptoms in only one eye.  Your vision is blurry.  You have vision changes.  You have very bad eye pain. Summary  Allergic conjunctivitis is caused by allergies. It can make your eye red or pink, and it can make your eye feel itchy.  This condition cannot be spread from one person to another person (is not contagious).  Try not to be around things that you are allergic to.  Take or apply over-the-counter and prescription medicines only as told by your doctor. These include any eye drops.  Contact your  doctor if your symptoms get worse or they do not get better with treatment. This information is not intended to replace advice given to you by your health care provider. Make sure you discuss any questions you have with your health care provider. Document Released: 12/15/2009 Document Revised: 02/19/2016 Document Reviewed: 02/19/2016 Elsevier Interactive Patient Education  2017 [place]. Bacterial Conjunctivitis Bacterial conjunctivitis is an infection of your conjunctiva. This is the clear membrane that covers the white part of your eye and the inner surface of your eyelid. This condition can make your eye:  Red or pink.  Itchy.  This condition is caused by bacteria. This condition spreads very easily from person to person (is contagious) and from one eye to the other eye. Follow these instructions at home: Medicines  Take or apply your antibiotic medicine as told by your doctor. Do not stop taking or applying the antibiotic even if you start to feel better.  Take or apply over-the-counter and prescription medicines only as told by your doctor.  Do not touch your eyelid with the eye drop bottle or the ointment tube. Managing discomfort  Wipe any fluid from your eye with a warm, wet washcloth or a cotton ball.  Place a cool, clean washcloth on your eye. Do this for 10-20 minutes, 3-4 times per day. General instructions  Do not wear contact lenses until the irritation is gone. Wear glasses until your doctor says it is okay to wear contacts.  Do not wear eye makeup until your symptoms are gone. Throw away any old makeup.  Change or wash your pillowcase every day.  Do not share towels or washcloths with anyone.  Wash your hands often with soap and water. Use paper towels to dry your hands.  Do not touch or rub your eyes.  Do not drive or use heavy machinery if your vision is blurry. Contact a doctor if:  You have a fever.  Your symptoms do not get better after 10  days. Get help right away if:  You have a fever and your symptoms suddenly get worse.  You have very bad pain when you move your eye.  Your face: ? Hurts. ? Is red. ? Is swollen.  You have sudden loss of vision. This information is not intended to replace advice given to you by  your health care provider. Make sure you discuss any questions you have with your health care provider. Document Released: 04/05/2008 Document Revised: 12/03/2015 Document Reviewed: 04/09/2015 Elsevier Interactive Patient Education  2018 [place], Adult The ears produce a substance called earwax that helps keep bacteria out of the ear and protects the skin in the ear canal. Occasionally, earwax can build up in the ear and cause discomfort or hearing loss. What increases the risk? This condition is more likely to develop in people who:  Are male.  Are elderly.  Naturally produce more earwax.  Clean their ears often with cotton swabs.  Use earplugs often.  Use in-ear headphones often.  Wear hearing aids.  Have narrow ear canals.  Have earwax that is overly thick or sticky.  Have eczema.  Are dehydrated.  Have excess hair in the ear canal.  What are the signs or symptoms? Symptoms of this condition include:  Reduced or muffled hearing.  A feeling of fullness in the ear or feeling that the ear is plugged.  Fluid coming from the ear.  Ear pain.  Ear itch.  Ringing in the ear.  Coughing.  An obvious piece of earwax that can be seen inside the ear canal.  How is this diagnosed? This condition may be diagnosed based on:  Your symptoms.  Your medical history.  An ear exam. During the exam, your health care provider will look into your ear with an instrument called an otoscope.  You may have tests, including a hearing test. How is this treated? This condition may be treated by:  Using ear drops to soften the earwax.  Having the earwax removed by a  health care provider. The health care provider may: ? Flush the ear with water. ? Use an instrument that has a loop on the end (curette). ? Use a suction device.  Surgery to remove the wax buildup. This may be done in severe cases.  Follow these instructions at home:  Take over-the-counter and prescription medicines only as told by your health care provider.  Do not put any objects, including cotton swabs, into your ear. You can clean the opening of your ear canal with a washcloth or facial tissue.  Follow instructions from your health care provider about cleaning your ears. Do not over-clean your ears.  Drink enough fluid to keep your urine clear or pale yellow. This will help to thin the earwax.  Keep all follow-up visits as told by your health care provider. If earwax builds up in your ears often or if you use hearing aids, consider seeing your health care provider for routine, preventive ear cleanings. Ask your health care provider how often you should schedule your cleanings.  If you have hearing aids, clean them according to instructions from the manufacturer and your health care provider. Contact a health care provider if:  You have ear pain.  You develop a fever.  You have blood, pus, or other fluid coming from your ear.  You have hearing loss.  You have ringing in your ears that does not go away.  Your symptoms do not improve with treatment.  You feel like the room is spinning (vertigo). Summary  Earwax can build up in the ear and cause discomfort or hearing loss.  The most common symptoms of this condition include reduced or muffled hearing and a feeling of fullness in the ear or feeling that the ear is plugged.  This condition may be diagnosed based on your  symptoms, your medical history, and an ear exam.  This condition may be treated by using ear drops to soften the earwax or by having the earwax removed by a health care provider.  Do not put any objects,  including cotton swabs, into your ear. You can clean the opening of your ear canal with a washcloth or facial tissue. This information is not intended to replace advice given to you by your health care provider. Make sure you discuss any questions you have with your health care provider. Document Released: 08/04/2004 Document Revised: 09/07/2016 Document Reviewed: 09/07/2016 Elsevier Interactive Patient Education  [employer].

## 2018-02-10 ENCOUNTER — Other Ambulatory Visit: Payer: Self-pay | Admitting: Adult Health

## 2018-06-24 ENCOUNTER — Encounter: Payer: Self-pay | Admitting: Emergency Medicine

## 2018-06-24 DIAGNOSIS — F3162 Bipolar disorder, current episode mixed, moderate: Secondary | ICD-10-CM

## 2018-07-09 ENCOUNTER — Encounter: Payer: Self-pay | Admitting: Psychiatry

## 2018-07-09 ENCOUNTER — Ambulatory Visit: Payer: BLUE CROSS/BLUE SHIELD | Admitting: Psychiatry

## 2018-07-09 DIAGNOSIS — F319 Bipolar disorder, unspecified: Secondary | ICD-10-CM | POA: Diagnosis not present

## 2018-07-09 DIAGNOSIS — F5105 Insomnia due to other mental disorder: Secondary | ICD-10-CM | POA: Diagnosis not present

## 2018-07-09 MED ORDER — LORAZEPAM 0.5 MG PO TABS: 1.5000 mg | ORAL_TABLET | Freq: Every day | ORAL | 1 refills | Status: DC

## 2018-07-09 NOTE — Progress Notes (Signed)
Carl Melton [id number] [birth year] 62 y.o.  Subjective:   Patient ID:  Carl Melton is a 62 y.o. (DOB 12/25/1955) male.  Chief Complaint:  Chief Complaint  Patient presents with  . Follow-up    Medication management and bipolar    HPI Carl Melton presents to the office today for follow-up of bipolar disorder and sleep.    More nights lately of EMA.  Has to get up anyway bc nocturia and now having trouble going back too sleep for the last 3-4 mos. 2 nights out of the week.  Most of the time feeling great.  Active.  Patient reports stable mood and denies depressed but sometimes irritable moods which he attributes to poor sleep.  Patient denies any recent difficulty with anxiety.  Patient denies difficulty with sleep initiation. Denies appetite disturbance.  Patient reports that energy and motivation have been good.  Patient denies any difficulty with concentration.  Patient denies any suicidal ideation.  Retirement probably 3-4 years off.  Review of Systems:  Review of Systems  Genitourinary: Positive for frequency.  Neurological: Negative for tremors and weakness.  Psychiatric/Behavioral: Positive for sleep disturbance. Negative for agitation, behavioral problems, confusion, decreased concentration, dysphoric mood, hallucinations, self-injury and suicidal ideas. The patient is not nervous/anxious and is not hyperactive.     Medications: I have reviewed the patient's current medications.  Current Outpatient Medications  Medication Sig Dispense Refill  . Dulaglutide 1.5 MG/0.5ML SOPN Inject into the skin.    . felodipine (PLENDIL) 5 MG 24 hr tablet   4  . fluticasone (FLONASE) 50 MCG/ACT nasal spray Place 2 sprays into both nostrils daily. 16 g 1  . levothyroxine (SYNTHROID, LEVOTHROID) 88 MCG tablet Take 88 mcg by mouth daily.  5  . lisinopril (PRINIVIL,ZESTRIL) 20 MG tablet Take 20 mg by mouth daily.  5  . lithium carbonate (ESKALITH) 450 MG CR tablet 675 in the am and 900  at HS  6  . LORazepam (ATIVAN) 1 MG tablet Take 1 mg by mouth at bedtime.    . metFORMIN (GLUCOPHAGE-XR) 500 MG 24 hr tablet Take 1,000 mg by mouth daily.  5  . risperiDONE (RISPERDAL) 1 MG tablet Take 1 mg by mouth at bedtime.  9  . rosuvastatin (CRESTOR) 20 MG tablet Take by mouth.    [name] BYETTA 5 MCG PEN 5 MCG/0.02ML SOPN injection INJECT 5 MCG TWICE A DAY BEFORE MEALS  4  . loratadine (CLARITIN) 10 MG tablet TAKE 1 TABLET BY MOUTH EVERY DAY (Patient not taking: Reported on 07/09/2018) 30 tablet 1  . lovastatin (MEVACOR) 40 MG tablet TAKE 1 TABLET (40 MG TOTAL) BY MOUTH DAILY WITH DINNER.  5  . NOVOTWIST 32G X 5 MM MISC 2 (two) times daily.  4   No current facility-administered medications for this visit.     Medication Side Effects: None  Allergies:  Allergies  Allergen Reactions  . Erythromycin Ethylsuccinate Rash  . Simvastatin Other (See Comments) and Rash    History reviewed. No pertinent past medical history.  History reviewed. No pertinent family history.  Social History   Socioeconomic History  . Marital status: Married    Spouse name: Not on file  . Number of children: Not on file  . Years of education: Not on file  . Highest education level: Not on file  Occupational History  . Not on file  Social Needs  . Financial resource strain: Not on file  . Food insecurity:    Worry: Not  on file    Inability: Not on file  . Transportation needs:    Medical: Not on file    Non-medical: Not on file  Tobacco Use  . Smoking status: Former Research scientist (life sciences)  . Smokeless tobacco: Never Used  . Tobacco comment: quit 25 years  Substance and Sexual Activity  . Alcohol use: Yes    Alcohol/week: 4.0 standard drinks    Types: 4 Cans of beer per week  . Drug use: No  . Sexual activity: Not on file  Lifestyle  . Physical activity:    Days per week: Not on file    Minutes per session: Not on file  . Stress: Not on file  Relationships  . Social connections:    Talks on phone: Not  on file    Gets together: Not on file    Attends religious service: Not on file    Active member of club or organization: Not on file    Attends meetings of clubs or organizations: Not on file    Relationship status: Not on file  . Intimate partner violence:    Fear of current or ex partner: Not on file    Emotionally abused: Not on file    Physically abused: Not on file    Forced sexual activity: Not on file  Other Topics Concern  . Not on file  Social History Narrative  . Not on file    Past Medical History, Surgical history, Social history, and Family history were reviewed and updated as appropriate.   Please see review of systems for further details on the patient's review from today.   Objective:   Physical Exam:  There were no vitals taken for this visit.  Physical Exam Constitutional:      General: He is not in acute distress.    Appearance: He is well-developed.  Musculoskeletal:        General: No deformity.  Neurological:     Mental Status: He is alert and oriented to person, place, and time.     Motor: No tremor.     Coordination: Coordination normal.     Gait: Gait normal.  Psychiatric:        Attention and Perception: Attention and perception normal.        Mood and Affect: Mood is not anxious or depressed. Affect is not labile, blunt, angry or inappropriate.        Speech: Speech normal.        Behavior: Behavior normal.        Thought Content: Thought content normal. Thought content does not include homicidal or suicidal ideation. Thought content does not include homicidal or suicidal plan.        Cognition and Memory: Cognition normal.        Judgment: Judgment normal.     Comments: Insight intact. No auditory or visual hallucinations. No delusions.      Lab Review:  No results found for: NA, K, CL, CO2, GLUCOSE, BUN, CREATININE, CALCIUM, PROT, ALBUMIN, AST, ALT, ALKPHOS, BILITOT, GFRNONAA, GFRAA  No results found for: WBC, RBC, HGB, HCT, PLT, MCV,  MCH, MCHC, RDW, LYMPHSABS, MONOABS, EOSABS, BASOSABS  No results found for: POCLITH, LITHIUM   No results found for: PHENYTOIN, PHENOBARB, VALPROATE, CBMZ   Last BMP was January 31, 2018 and was within normal limits except for glucose 227, TSH was 4.64, and lithium level was 0.8 which is stable.  .res Assessment: Plan:    Bipolar I disorder ([place])  Insomnia due  to mental condition    We discussed the short-term risks associated with benzodiazepines including sedation and increased fall risk among others.  Discussed long-term side effect risk including dependence, potential withdrawal symptoms, hangover effects and tolerance, and the potential eventual dose-related risk of dementia.    Options increase the lorazepam or use prn [place].  Since the EMA is happening almost half the time the best option is probably to increase to 1.5 mg lorazepam QHS.  Call back if the insomnia persists.  Need to treat the insomnia because it is a risk factor for mania.  Check labs.  Says it was done recently.  Counseled patient regarding potential benefits, risks, and side effects of lithium to include potential risk of lithium affecting thyroid and renal function.  Discussed need for periodic lab monitoring to determine drug level and to assess for potential adverse effects.  Counseled patient regarding signs and symptoms of lithium toxicity and advised that they notify office immediately or seek urgent medical attention if experiencing these signs and symptoms.  Patient advised to contact office with any questions or concerns.  FU 6 mos  Lynder Parents, MD, DFAPA    Please see After Visit Summary for patient specific instructions.  No future appointments.  No orders of the defined types were placed in this encounter.     -------------------------------

## 2018-07-16 ENCOUNTER — Encounter: Payer: Self-pay | Admitting: Medical

## 2018-07-16 ENCOUNTER — Ambulatory Visit: Payer: BLUE CROSS/BLUE SHIELD | Admitting: Medical

## 2018-07-16 VITALS — BP 134/69 | HR 92 | Temp 96.6°F | Resp 18 | Wt 292.6 lb

## 2018-07-16 DIAGNOSIS — S39012A Strain of muscle, fascia and tendon of lower back, initial encounter: Secondary | ICD-10-CM

## 2018-07-16 MED ORDER — ACETAMINOPHEN 500 MG PO TABS: 500.0000 mg | ORAL_TABLET | Freq: Once | ORAL | Status: AC

## 2018-07-16 NOTE — Patient Instructions (Addendum)
Heat Therapy Heat therapy can help ease sore, stiff, injured, and tight muscles and joints. Heat relaxes your muscles, which may help ease your pain and muscle spasms. Do not use heat therapy unless your doctor tells you to use it. How to use heat therapy There are several different kinds of heat therapy, including:  Moist heat pack.  Hot water bottle.  Electric heating pad.  Heated gel pack.  Heated wrap.  Warm water bath. Your doctor will tell you how to use heat therapy. In general, you should: 1. Place a towel between your skin and the heat source. 2. Leave the heat on for 20-30 minutes. Your skin may turn pink. 3. Remove the heat if your skin turns bright red. You should remove the heat source if you are unable to feel pain, heat, or cold. You are more likely to get burned if you leave it on the skin for too long. Your doctor may also tell you to take a warm water bath. To do this: 1. Put a non-slip pad in the bathtub to prevent a fall. 2. Fill the bathtub with warm water. 3. Check the water temperature. 4. Soak in the water for 15-20 minutes, or as told by your doctor. 5. Be careful when you stand up after the bath. You may feel dizzy. 6. Pat yourself dry after the bath. Do not rub your skin to dry it. General recommendations for heat therapy  Do not sleep while using heat therapy. Only use heat therapy while you are awake.  Your skin may turn pink while using heat therapy. Do not use heat therapy if your skin turns red.  Do not use heat therapy if you have a new injury.  High heat or using heat for a long time can cause burns. Be careful not to burn your skin when using heat therapy.  Do not use heat therapy on areas of your skin that are already irritated, such as with a rash or sunburn. Get help if you have:  Blisters, redness, swelling (puffiness), or numbness.  New pain.  Pain that is getting worse. Summary  Heat therapy is the use of heat to help ease sore,  stiff, injured, and tight muscles and joints.  There are different types of heat therapy. Your doctor will tell you which one to use.  Only use heat therapy while you are awake.  Watch your skin to make sure you do not get burned while using heat therapy. This information is not intended to replace advice given to you by your health care provider. Make sure you discuss any questions you have with your health care provider. Document Released: 09/19/2011 Document Revised: 07/08/2017 Document Reviewed: 07/08/2017 Elsevier Interactive Patient Education  2019 [place] and [employer]  Ice and cold packs are used so that we can return the muscle to it's natural resting state without causing more pain, which can lead to more spasm, etc.  Icing and cold packs are also used to reduce swelling, which can lead to pain and stiffness.  COLD PACKS Cold packs should be placed circumferentially around the swollen area (ie., the wrist, etc.).  A paper towel can be placed over the area before the cold pack is applied.  A towel may be wrapped around the outside of the cold pack to keep the cold in.  The swollen area should be elevated with the cold pack, if possible.  ICE MASSAGE Fill a 4 ounce paper cup three-quarters full  and put it in a freezer until it is frozen.  When ready to use, tear off about 1 inch of the cup so that some of the ice is showing while the bottom of the cup can be used to hold onto. Massage the entire muscle area as instructed by your therapist.  You may use circular or up and down strokes, but do not hold the ice in one spot.  Four phases to the ice massage and cold pack application: 1. Cold: which you feel when you first apply the ice. 2. Ache: after a few minutes 3. Burning: after approximately five minutes, it will feel like your skin is burning.  At this point, remove the ice for a minute or so. 4. Numbness: THIS IS THE CRUCIAL PHASE!!! Return the ice  or cold pack and massage until all the burning disappears.  This signals the end of cryotherapy.  The entire procedure should take ten to fifteen minutes while using the cold pack.  Do Not perform the ice massage for more than seven minutes on a small area or more than ten minutes on a large area.  Cryotherapy should be performed after exercises, when edema occurs, or when an area is painful.Acute Back Pain, Adult Acute back pain is sudden and usually short-lived. It is often caused by an injury to the muscles and tissues in the back. The injury may result from:  A muscle or ligament getting overstretched or torn (strained). Ligaments are tissues that connect bones to each other. Lifting something improperly can cause a back strain.  Wear and tear (degeneration) of the spinal disks. Spinal disks are circular tissue that provides cushioning between the bones of the spine (vertebrae).  Twisting motions, such as while playing sports or doing yard work.  A hit to the back.  Arthritis. You may have a physical exam, lab tests, and imaging tests to find the cause of your pain. Acute back pain usually goes away with rest and home care. Follow these instructions at home: Managing pain, stiffness, and swelling  Take over-the-counter and prescription medicines only as told by your health care provider.  Your health care provider may recommend applying ice during the first 24-48 hours after your pain starts. To do this: ? Put ice in a plastic bag. ? Place a towel between your skin and the bag. ? Leave the ice on for 20 minutes, 2-3 times a day.  If directed, apply heat to the affected area as often as told by your health care provider. Use the heat source that your health care provider recommends, such as a moist heat pack or a heating pad. ? Place a towel between your skin and the heat source. ? Leave the heat on for 20-30 minutes. ? Remove the heat if your skin turns bright red. This is especially  important if you are unable to feel pain, heat, or cold. You have a greater risk of getting burned. Activity   Do not stay in bed. Staying in bed for more than 1-2 days can delay your recovery.  Sit up and stand up straight. Avoid leaning forward when you sit, or hunching over when you stand. ? If you work at a desk, sit close to it so you do not need to lean over. Keep your chin tucked in. Keep your neck drawn back, and keep your elbows bent at a right angle. Your arms should look like the letter "L." ? Sit high and close to the steering wheel when  you drive. Add lower back (lumbar) support to your car seat, if needed.  Take short walks on even surfaces as soon as you are able. Try to increase the length of time you walk each day.  Do not sit, drive, or stand in one place for more than 30 minutes at a time. Sitting or standing for long periods of time can put stress on your back.  Do not drive or use heavy machinery while taking prescription pain medicine.  Use proper lifting techniques. When you bend and lift, use positions that put less stress on your back: ? [place] your knees. ? Keep the load close to your body. ? Avoid twisting.  Exercise regularly as told by your health care provider. Exercising helps your back heal faster and helps prevent back injuries by keeping muscles strong and flexible.  Work with a physical therapist to make a safe exercise program, as recommended by your health care provider. Do any exercises as told by your physical therapist. Lifestyle  Maintain a healthy weight. Extra weight puts stress on your back and makes it difficult to have good posture.  Avoid activities or situations that make you feel anxious or stressed. Stress and anxiety increase muscle tension and can make back pain worse. Learn ways to manage anxiety and stress, such as through exercise. General instructions  Sleep on a firm mattress in a comfortable position. Try lying on your side with  your knees slightly bent. If you lie on your back, put a pillow under your knees.  Follow your treatment plan as told by your health care provider. This may include: ? Cognitive or behavioral therapy. ? Acupuncture or massage therapy. ? Meditation or yoga. Contact a health care provider if:  You have pain that is not relieved with rest or medicine.  You have increasing pain going down into your legs or buttocks.  Your pain does not improve after 2 weeks.  You have pain at night.  You lose weight without trying.  You have a fever or chills. Get help right away if:  You develop new bowel or bladder control problems.  You have unusual weakness or numbness in your arms or legs.  You develop nausea or vomiting.  You develop abdominal pain.  You feel faint. Summary  Acute back pain is sudden and usually short-lived.  Use proper lifting techniques. When you bend and lift, use positions that put less stress on your back.  Take over-the-counter and prescription medicines and apply heat or ice as directed by your health care provider. This information is not intended to replace advice given to you by your health care provider. Make sure you discuss any questions you have with your health care provider. Document Released: 06/27/2005 Document Revised: 02/01/2018 Document Reviewed: 02/08/2017 Elsevier Interactive Patient Education  2019 [employer].

## 2018-07-16 NOTE — Progress Notes (Signed)
Subjective:    Patient ID: Carl Melton, male    DOB: 27-Sep-1955, 63 y.o.   MRN: [id number]  HPI  63 yo male in non acute distress. Started about 1.5 weeks ago after Christmas  With left lower back pain.in. which has gotten worse, felt it gradually come on.  Cleaned old building on his properties, recalls no single instance that caused the pain. Pain is located on the left side of lower back  with movement bending or twisting of  trunk , and when with initialting a step he can feel it. But with walking it does no cause pain.Taking  Aleve tablets x 3 in the morning t  one time a day. Denies  Numbness or tingling. No saddle parethesia. Rates the pain sitting still its 4/10 and with movement a  9/ 10.  Has not tried heat or ice. Denies fever or chills , chest pain or shortness of breath. No history of renal lithisasis.  Denies previous back pain.  Denies weakness in legs.   Works at [employer] in [place] [employer]. Out of work since  Dec 22nd 2019 due to [employer] holiday. Worked last Thursday and Friday no change in pain..   Blood pressure 134/69, pulse 92, temperature (!) 96.6 F (35.9 C), temperature source Tympanic, resp. rate 18, weight 292 lb 9.6 oz (132.7 kg), SpO2 97 %.  Review of Systems  Constitutional: Negative for chills and fever.  HENT: Positive for rhinorrhea (sniffles, no t sure if it isd allergies or  sawdust related.).   Eyes: Negative for visual disturbance.  Respiratory: Negative for cough (mild , green phelgm.) and shortness of breath.   Cardiovascular: Negative for chest pain.  Gastrointestinal: Negative for abdominal pain, diarrhea and vomiting.  Genitourinary: Negative for dysuria, hematuria and testicular pain.  Musculoskeletal: Positive for back pain (left side).  Skin: Negative for rash.  Allergic/Immunologic: Positive for environmental allergies. Negative for food allergies.  Neurological: Negative for syncope and numbness.  Hematological: Negative for  adenopathy.  Psychiatric/Behavioral: Negative for behavioral problems, confusion, self-injury and suicidal ideas.       Objective:   Physical Exam Vitals signs and nursing note reviewed.  Constitutional:      Appearance: Normal appearance. He is obese.  HENT:     Head: Normocephalic and atraumatic.     Mouth/Throat:     Mouth: Mucous membranes are moist.  Eyes:     Extraocular Movements: Extraocular movements intact.     Conjunctiva/sclera: Conjunctivae normal.     Pupils: Pupils are equal, round, and reactive to light.  Neck:     Musculoskeletal: Normal range of motion.  Musculoskeletal: Normal range of motion.        General: No tenderness.  Skin:    General: Skin is warm and dry.     Findings: No rash.  Neurological:     General: No focal deficit present.     Mental Status: He is alert and oriented to person, place, and time.  Psychiatric:        Mood and Affect: Mood normal.        Behavior: Behavior normal.        Thought Content: Thought content normal.        Judgment: Judgment normal.   5/5 strength bilateral lower legs on extension and flexion, neg SLR bilaterally Patient too tense to get popliteal reflexes.       Easily gets off the table and sits in chair. Gait steady and purposeful. Assessment & Plan:  Lower left back strain Patient currently on Lithium  So no Flexeril was given to  pathent. Given note for work no bending , lifting or twisting x 7 days.. Take Tylenol for pain as directed on the package. Avoid NSIds for now. Utilize heat and ice to the area and rest. Follow up with Dr. Myrtie Hawk within  one week if not improving. Patient verbalizes understanding and has no questions at discharge.

## 2018-07-23 ENCOUNTER — Other Ambulatory Visit: Payer: Self-pay

## 2018-07-23 MED ORDER — LITHIUM CARBONATE ER 450 MG PO TBCR: EXTENDED_RELEASE_TABLET | ORAL | 1 refills | Status: DC

## 2018-08-06 ENCOUNTER — Ambulatory Visit
Admission: RE | Admit: 2018-08-06 | Discharge: 2018-08-06 | Disposition: A | Discharge status: Home / Self Care | Payer: BLUE CROSS/BLUE SHIELD | Attending: Gastroenterology | Admitting: Gastroenterology

## 2018-08-06 ENCOUNTER — Encounter: Payer: Self-pay | Admitting: *Deleted

## 2018-08-06 ENCOUNTER — Ambulatory Visit: Payer: BLUE CROSS/BLUE SHIELD | Admitting: Anesthesiology

## 2018-08-06 ENCOUNTER — Encounter
Admission: RE | Disposition: A | Discharge status: Home / Self Care | Payer: Self-pay | Source: Home / Self Care | Attending: Gastroenterology

## 2018-08-06 DIAGNOSIS — Z888 Allergy status to other drugs, medicaments and biological substances status: Secondary | ICD-10-CM | POA: Insufficient documentation

## 2018-08-06 DIAGNOSIS — Z79890 Hormone replacement therapy (postmenopausal): Secondary | ICD-10-CM | POA: Insufficient documentation

## 2018-08-06 DIAGNOSIS — Z79899 Other long term (current) drug therapy: Secondary | ICD-10-CM | POA: Diagnosis not present

## 2018-08-06 DIAGNOSIS — F329 Major depressive disorder, single episode, unspecified: Secondary | ICD-10-CM | POA: Insufficient documentation

## 2018-08-06 DIAGNOSIS — Z86010 Personal history of colonic polyps: Secondary | ICD-10-CM | POA: Insufficient documentation

## 2018-08-06 DIAGNOSIS — Z538 Procedure and treatment not carried out for other reasons: Secondary | ICD-10-CM | POA: Diagnosis not present

## 2018-08-06 DIAGNOSIS — Z1211 Encounter for screening for malignant neoplasm of colon: Secondary | ICD-10-CM | POA: Diagnosis not present

## 2018-08-06 DIAGNOSIS — E039 Hypothyroidism, unspecified: Secondary | ICD-10-CM | POA: Insufficient documentation

## 2018-08-06 DIAGNOSIS — Z7984 Long term (current) use of oral hypoglycemic drugs: Secondary | ICD-10-CM | POA: Diagnosis not present

## 2018-08-06 DIAGNOSIS — G4730 Sleep apnea, unspecified: Secondary | ICD-10-CM | POA: Diagnosis not present

## 2018-08-06 DIAGNOSIS — I10 Essential (primary) hypertension: Secondary | ICD-10-CM | POA: Diagnosis not present

## 2018-08-06 DIAGNOSIS — E119 Type 2 diabetes mellitus without complications: Secondary | ICD-10-CM | POA: Insufficient documentation

## 2018-08-06 DIAGNOSIS — E785 Hyperlipidemia, unspecified: Secondary | ICD-10-CM | POA: Insufficient documentation

## 2018-08-06 DIAGNOSIS — Z881 Allergy status to other antibiotic agents status: Secondary | ICD-10-CM | POA: Insufficient documentation

## 2018-08-06 HISTORY — PX: COLONOSCOPY WITH PROPOFOL: SHX5780

## 2018-08-06 HISTORY — DX: Cardiac arrhythmia, unspecified: I49.9

## 2018-08-06 HISTORY — DX: Type 2 diabetes mellitus without complications: E11.9

## 2018-08-06 HISTORY — DX: Hypothyroidism, unspecified: E03.9

## 2018-08-06 HISTORY — DX: Hyperlipidemia, unspecified: E78.5

## 2018-08-06 HISTORY — DX: Major depressive disorder, single episode, unspecified: F32.9

## 2018-08-06 HISTORY — DX: Depression, unspecified: F32.A

## 2018-08-06 HISTORY — DX: Sleep apnea, unspecified: G47.30

## 2018-08-06 HISTORY — DX: Essential (primary) hypertension: I10

## 2018-08-06 LAB — GLUCOSE, CAPILLARY: Glucose-Capillary: 117 mg/dL — ABNORMAL HIGH (ref 70–99)

## 2018-08-06 SURGERY — COLONOSCOPY WITH PROPOFOL
Anesthesia: General

## 2018-08-06 MED ORDER — SODIUM CHLORIDE 0.9 % IV SOLN
INTRAVENOUS | Status: DC
  Administered 2018-08-06: 09:00:00 via INTRAVENOUS
  Administered 2018-08-06: 1000 mL via INTRAVENOUS

## 2018-08-06 MED ORDER — FENTANYL CITRATE (PF) 100 MCG/2ML IJ SOLN
INTRAMUSCULAR | Status: AC
  Filled 2018-08-06: qty 2

## 2018-08-06 MED ORDER — LIDOCAINE HCL (PF) 2 % IJ SOLN
INTRAMUSCULAR | Status: AC
  Filled 2018-08-06: qty 10

## 2018-08-06 MED ORDER — EPHEDRINE SULFATE 50 MG/ML IJ SOLN
INTRAMUSCULAR | Status: AC
  Filled 2018-08-06: qty 1

## 2018-08-06 MED ORDER — MIDAZOLAM HCL 2 MG/2ML IJ SOLN
INTRAMUSCULAR | Status: DC | PRN
  Administered 2018-08-06 (×2): 1 mg via INTRAVENOUS

## 2018-08-06 MED ORDER — MIDAZOLAM HCL 2 MG/2ML IJ SOLN
INTRAMUSCULAR | Status: AC
  Filled 2018-08-06: qty 2

## 2018-08-06 MED ORDER — PROPOFOL 10 MG/ML IV BOLUS
INTRAVENOUS | Status: DC | PRN
  Administered 2018-08-06: 30 mg via INTRAVENOUS

## 2018-08-06 NOTE — Anesthesia Post-op Follow-up Note (Signed)
Anesthesia QCDR form completed.        

## 2018-08-06 NOTE — Anesthesia Preprocedure Evaluation (Addendum)
Anesthesia Evaluation  Patient identified by MRN, date of birth, ID band Patient awake    Reviewed: Allergy & Precautions, NPO status , Patient's Chart, lab work & pertinent test results  Airway Mallampati: III  TM Distance: >3 FB    Comment: Large neck Dental  (+) Chipped   Pulmonary sleep apnea , former smoker,    Pulmonary exam normal        Cardiovascular hypertension, Normal cardiovascular exam+ dysrhythmias      Neuro/Psych PSYCHIATRIC DISORDERS Depression Bipolar Disorder negative neurological ROS     GI/Hepatic negative GI ROS,   Endo/Other  diabetesHypothyroidism Morbid obesity  Renal/GU   negative genitourinary   Musculoskeletal negative musculoskeletal ROS (+)   Abdominal Normal abdominal exam  (+)   Peds negative pediatric ROS (+)  Hematology negative hematology ROS (+)   Anesthesia Other Findings   Reproductive/Obstetrics                            Anesthesia Physical Anesthesia Plan  ASA: III  Anesthesia Plan: General   Post-op Pain Management:    Induction: Intravenous  PONV Risk Score and Plan: TIVA  Airway Management Planned: Nasal Cannula  Additional Equipment:   Intra-op Plan:   Post-operative Plan:   Informed Consent: I have reviewed the patients History and Physical, chart, labs and discussed the procedure including the risks, benefits and alternatives for the proposed anesthesia with the patient or authorized representative who has indicated his/her understanding and acceptance.     Dental advisory given  Plan Discussed with: CRNA and Surgeon  Anesthesia Plan Comments:         Anesthesia Quick Evaluation

## 2018-08-06 NOTE — Transfer of Care (Signed)
Immediate Anesthesia Transfer of Care Note  Patient: Carl Melton  Procedure(s) Performed: COLONOSCOPY WITH PROPOFOL (N/A )  Patient Location: PACU  Anesthesia Type:General  Level of Consciousness: sedated  Airway & Oxygen Therapy: Patient Spontanous Breathing  Post-op Assessment: Report given to RN and Post -op Vital signs reviewed and stable  Post vital signs: Reviewed and stable  Last Vitals:  Vitals Value Taken Time  BP    Temp    Pulse 80 08/06/2018  8:47 AM  Resp    SpO2 90 % 08/06/2018  8:47 AM  Vitals shown include unvalidated device data.  Last Pain:  Vitals:   08/06/18 0751  TempSrc: Tympanic  PainSc: 8       Patients Stated Pain Goal: 0 (98/47/30 8569)  Complications: No apparent anesthesia complications

## 2018-08-06 NOTE — Op Note (Addendum)
[facility] Gastroenterology Patient Name: Carl Melton Procedure Date: 08/06/2018 8:18 AM MRN: [id number] Account #: [id number] Date of Birth: 02-03-1956 Admit Type: Outpatient Age: 63 Room: [facility] ENDO ROOM 3 Gender: Male Note Status: Finalized Procedure:            Colonoscopy Providers:            Lollie Sails, MD Referring MD:         Ocie Cornfield. Ouida Sills MD, MD (Referring MD) Complications:        No immediate complications. Estimated blood loss: None. Procedure:            Pre-Anesthesia Assessment:                       - ASA Grade Assessment: III - A patient with severe                        systemic disease.                       After obtaining informed consent, the colonoscope was                        passed under direct vision. Throughout the procedure,                        the patient's blood pressure, pulse, and oxygen                        saturations were monitored continuously. The procedure                        was aborted. The colonscope was not inserted.                        Medications were given. The patient tolerated the                        procedure well. The quality of the bowel preparation                        was poor. The colonoscopy was aborted due to poor bowel                        prep with stool present. Findings:      The digital rectal exam findings include a copious amount of dark brown       effluent , some solid pieces. Impression:           - Preparation of the colon was poor.                       - The procedure was aborted due to poor bowel prep with                        stool present.                       - A copious amount of dark brown effluent , some solid  pieces found on digital rectal exam.                       - No specimens collected. Recommendation:       - reprep and reschedule                       - Discharge patient to home. Diagnosis Code(s):    ---  Professional ---                       Z53.8, Procedure and treatment not carried out for                        other reasons Lollie Sails, MD 08/06/2018 8:54:40 AM This report has been signed electronically. Number of Addenda: 0 Note Initiated On: 08/06/2018 8:18 AM      [facility]

## 2018-08-06 NOTE — Anesthesia Postprocedure Evaluation (Signed)
Anesthesia Post Note  Patient: Carl Melton  Procedure(s) Performed: COLONOSCOPY WITH PROPOFOL (N/A )  Patient location during evaluation: PACU Anesthesia Type: General Level of consciousness: awake and alert and oriented Pain management: pain level controlled Vital Signs Assessment: post-procedure vital signs reviewed and stable Respiratory status: spontaneous breathing Cardiovascular status: blood pressure returned to baseline Anesthetic complications: no     Last Vitals:  Vitals:   08/06/18 0806 08/06/18 0847  BP:  115/81  Pulse:  76  Resp:  16  Temp: (!) 36.1 C (!) 36.1 C  SpO2:  97%    Last Pain:  Vitals:   08/06/18 0847  TempSrc: Tympanic  PainSc: 8                  Amber Williard

## 2018-08-06 NOTE — H&P (Signed)
Outpatient short stay form Pre-procedure 08/06/2018 8:23 AM Carl Sails MD  Primary Physician: Frazier Richards, MD  Reason for visit: Colonoscopy  History of present illness: Patient is a 63 year old male presenting today for colonoscopy.  His last colonoscopy was 01/04/2013.  He showed diverticulosis at that time however he has a personal history of adenomatous colon polyps.  He is presenting today for recheck.  Tolerated his prep well.  He takes no aspirin or blood thinning agent.    Current Facility-Administered Medications:  .  0.9 %  sodium chloride infusion, , Intravenous, Continuous, Carl Sails, MD, Last Rate: 20 mL/hr at 08/06/18 0814, 1,000 mL at 08/06/18 8182  Facility-Administered Medications Prior to Admission  Medication Dose Route Frequency Provider Last Rate Last Dose  . acetaminophen (TYLENOL) tablet 500 mg  500 mg Oral Once Ratcliffe, Heather R, PA-C       Medications Prior to Admission  Medication Sig Dispense Refill Last Dose  . Dulaglutide 1.5 MG/0.5ML SOPN Inject 1.5 mg into the skin once a week.    Past Week at Unknown time  . empagliflozin (JARDIANCE) 25 MG TABS tablet Take 25 mg by mouth daily.   08/05/2018 at Unknown time  . felodipine (PLENDIL) 5 MG 24 hr tablet 5 mg daily.   4 08/05/2018 at Unknown time  . levothyroxine (SYNTHROID, LEVOTHROID) 88 MCG tablet Take 88 mcg by mouth daily.  5 Past Week at Unknown time  . lisinopril (PRINIVIL,ZESTRIL) 20 MG tablet Take 20 mg by mouth daily.  5 08/05/2018 at Unknown time  . lithium carbonate (ESKALITH) 450 MG CR tablet Take 1.5 tablets by mouth every morning and 2 tablets in the evening 315 tablet 1 08/05/2018 at Unknown time  . LORazepam (ATIVAN) 0.5 MG tablet Take 3 tablets (1.5 mg total) by mouth at bedtime. 135 tablet 1 08/05/2018 at Unknown time  . metFORMIN (GLUCOPHAGE-XR) 500 MG 24 hr tablet Take 1,000 mg by mouth daily.  5 08/05/2018 at Unknown time  . risperiDONE (RISPERDAL) 1 MG tablet Take 1 mg by  mouth at bedtime.  9 Past Week at Unknown time  . rosuvastatin (CRESTOR) 20 MG tablet Take by mouth.   Past Week at Unknown time  . fluticasone (FLONASE) 50 MCG/ACT nasal spray Place 2 sprays into both nostrils daily. 16 g 1 Taking  . loratadine (CLARITIN) 10 MG tablet TAKE 1 TABLET BY MOUTH EVERY DAY (Patient not taking: Reported on 07/09/2018) 30 tablet 1  at prn  . naproxen sodium (ALEVE) 220 MG tablet Take 220 mg by mouth daily as needed.    at prn     Allergies  Allergen Reactions  . Erythromycin Ethylsuccinate Rash  . Simvastatin Other (See Comments) and Rash     Past Medical History:  Diagnosis Date  . Depression   . Diabetes mellitus without complication ([place])    type 2  . Dysrhythmia   . Hyperlipidemia   . Hypertension   . Hypothyroidism   . Sleep apnea    on CPAP    Review of systems:      Physical Exam    Heart and lungs: Regular rate and rhythm without rub or gallop, lungs are bilaterally clear.    HEENT: Normocephalic atraumatic eyes are anicteric    Other:    Pertinant exam for procedure: Soft nontender nondistended bowel sounds positive normoactive, protuberant.    Planned proceedures: Colonoscopy and indicated procedures. I have discussed the risks benefits and complications of procedures to include not limited to  bleeding, infection, perforation and the risk of sedation and the patient wishes to proceed.    Carl Sails, MD Gastroenterology 08/06/2018  8:23 AM

## 2018-08-08 ENCOUNTER — Encounter: Payer: Self-pay | Admitting: Gastroenterology

## 2018-10-01 ENCOUNTER — Other Ambulatory Visit: Payer: Self-pay | Admitting: Psychiatry

## 2018-11-01 ENCOUNTER — Other Ambulatory Visit: Payer: Self-pay

## 2018-11-01 MED ORDER — RISPERIDONE 1 MG PO TABS: 1.0000 mg | ORAL_TABLET | Freq: Every day | ORAL | 0 refills | Status: DC

## 2018-12-26 ENCOUNTER — Ambulatory Visit (INDEPENDENT_AMBULATORY_CARE_PROVIDER_SITE_OTHER): Payer: BC Managed Care – PPO

## 2018-12-26 ENCOUNTER — Ambulatory Visit (INDEPENDENT_AMBULATORY_CARE_PROVIDER_SITE_OTHER): Payer: BC Managed Care – PPO | Admitting: Podiatry

## 2018-12-26 ENCOUNTER — Encounter: Payer: Self-pay | Admitting: Podiatry

## 2018-12-26 ENCOUNTER — Other Ambulatory Visit: Payer: Self-pay

## 2018-12-26 VITALS — BP 128/67 | HR 82 | Temp 96.6°F | Resp 16

## 2018-12-26 DIAGNOSIS — M722 Plantar fascial fibromatosis: Secondary | ICD-10-CM | POA: Diagnosis not present

## 2018-12-26 MED ORDER — MELOXICAM 15 MG PO TABS: 15.0000 mg | ORAL_TABLET | Freq: Every day | ORAL | 3 refills | Status: DC

## 2018-12-26 NOTE — Patient Instructions (Signed)

## 2018-12-26 NOTE — Progress Notes (Signed)
Subjective:  Patient ID: Carl Melton, male    DOB: April 08, 1956,  MRN: [id number] HPI Chief Complaint  Patient presents with  . Foot Pain    Plantar heel left - aching x 2 weeks, treated in 2017 for same problem, lost night splint-but helped when wore it last time  . New Patient (Initial Visit)    63 y.o. male presents with the above complaint.   ROS: Denies fever chills nausea vomiting muscle aches pains calf pain back pain chest pain shortness of breath.  Past Medical History:  Diagnosis Date  . Depression   . Diabetes mellitus without complication ([place])    type 2  . Dysrhythmia   . Hyperlipidemia   . Hypertension   . Hypothyroidism   . Sleep apnea    on CPAP   Past Surgical History:  Procedure Laterality Date  . APPENDECTOMY    . COLONOSCOPY  01/04/2013, 08/04/2009  . COLONOSCOPY WITH PROPOFOL N/A 08/06/2018   Procedure: COLONOSCOPY WITH PROPOFOL;  Surgeon: Lollie Sails, MD;  Location: [facility] ENDOSCOPY;  Service: Endoscopy;  Laterality: N/A;  . ESOPHAGOGASTRODUODENOSCOPY  03/21/2002  . KNEE SURGERY    . UVULOPALATOPHARYNGOPLASTY  1997    Current Outpatient Medications:  .  empagliflozin (JARDIANCE) 25 MG TABS tablet, Take 25 mg by mouth daily., Disp: , Rfl:  .  felodipine (PLENDIL) 5 MG 24 hr tablet, 5 mg daily. , Disp: , Rfl: 4 .  levothyroxine (SYNTHROID, LEVOTHROID) 88 MCG tablet, Take 88 mcg by mouth daily., Disp: , Rfl: 5 .  lisinopril (PRINIVIL,ZESTRIL) 20 MG tablet, Take 20 mg by mouth daily., Disp: , Rfl: 5 .  lithium carbonate (ESKALITH) 450 MG CR tablet, Take 1.5 tablets by mouth every morning and 2 tablets in the evening, Disp: 315 tablet, Rfl: 1 .  LORazepam (ATIVAN) 0.5 MG tablet, TAKE 3 TABLETS (1.5 MG TOTAL) BY MOUTH AT BEDTIME., Disp: 135 tablet, Rfl: 1 .  meloxicam (MOBIC) 15 MG tablet, Take 1 tablet (15 mg total) by mouth daily., Disp: 30 tablet, Rfl: 3 .  metFORMIN (GLUCOPHAGE-XR) 500 MG 24 hr tablet, Take 1,000 mg by mouth daily., Disp: , Rfl: 5  .  risperiDONE (RISPERDAL) 1 MG tablet, Take 1 tablet (1 mg total) by mouth at bedtime., Disp: 90 tablet, Rfl: 0  Current Facility-Administered Medications:  .  acetaminophen (TYLENOL) tablet 500 mg, 500 mg, Oral, Once, Ratcliffe, Heather R, PA-C  Allergies  Allergen Reactions  . Erythromycin Ethylsuccinate Rash  . Simvastatin Other (See Comments) and Rash   Review of Systems Objective:   Vitals:   12/26/18 0931  BP: 128/67  Pulse: 82  Resp: 16  Temp: (!) 96.6 F (35.9 C)    General: Well developed, nourished, in no acute distress, alert and oriented x3   Dermatological: Skin is warm, dry and supple bilateral. Nails x 10 are well maintained; remaining integument appears unremarkable at this time. There are no open sores, no preulcerative lesions, no rash or signs of infection present.  Vascular: Dorsalis Pedis artery and Posterior Tibial artery pedal pulses are 2/4 bilateral with immedate capillary fill time. Pedal hair growth present. No varicosities and no lower extremity edema present bilateral.   Neruologic: Grossly intact via light touch bilateral. Vibratory intact via tuning fork bilateral. Protective threshold with Semmes Wienstein monofilament intact to all pedal sites bilateral. Patellar and Achilles deep tendon reflexes 2+ bilateral. No Babinski or clonus noted bilateral.   Musculoskeletal: No gross boney pedal deformities bilateral. No pain, crepitus, or limitation noted  with foot and ankle range of motion bilateral. Muscular strength 5/5 in all groups tested bilateral.  Pain on palpation mid calcaneal tubercle of the left heel.  Gait: Unassisted, Nonantalgic.    Radiographs:  Radiographs taken today demonstrate soft tissue increase in density plantar fashion calcaneal insertion site of the left heel.  Assessment & Plan:   Assessment: Plantar fasciitis left heel.  Plan: Started him on meloxicam.  Injected his left heel with 20 mg Kenalog 5 mg Marcaine for  maximal tenderness.  Tolerated procedure well.  Dispensed a plantar fascial splint and a night brace.     James Lafalce T. [place], [place]

## 2018-12-28 ENCOUNTER — Other Ambulatory Visit: Payer: Self-pay | Admitting: Psychiatry

## 2018-12-29 LAB — BASIC METABOLIC PANEL
BUN/Creatinine Ratio: 19 (ref 10–24)
BUN: 21 mg/dL (ref 8–27)
CO2: 19 mmol/L — ABNORMAL LOW (ref 20–29)
Calcium: 9.4 mg/dL (ref 8.6–10.2)
Chloride: 106 mmol/L (ref 96–106)
Creatinine, Ser: 1.1 mg/dL (ref 0.76–1.27)
GFR calc Af Amer: 83 mL/min/{1.73_m2} (ref >59)
GFR calc non Af Amer: 72 mL/min/{1.73_m2} (ref >59)
Glucose: 228 mg/dL — ABNORMAL HIGH (ref 65–99)
Potassium: 4.8 mmol/L (ref 3.5–5.2)
Sodium: 140 mmol/L (ref 134–144)

## 2018-12-29 LAB — LITHIUM LEVEL: Lithium Lvl: 1 mmol/L (ref 0.6–1.2)

## 2018-12-29 LAB — TSH: TSH: 3.19 u[IU]/mL (ref 0.450–4.500)

## 2018-12-31 ENCOUNTER — Other Ambulatory Visit: Payer: Self-pay

## 2018-12-31 MED ORDER — LORAZEPAM 0.5 MG PO TABS: 1.5000 mg | ORAL_TABLET | Freq: Every day | ORAL | 1 refills | Status: DC

## 2019-01-07 ENCOUNTER — Other Ambulatory Visit: Payer: Self-pay

## 2019-01-07 ENCOUNTER — Ambulatory Visit: Payer: BLUE CROSS/BLUE SHIELD | Admitting: Psychiatry

## 2019-01-07 ENCOUNTER — Encounter: Payer: Self-pay | Admitting: Psychiatry

## 2019-01-07 DIAGNOSIS — F319 Bipolar disorder, unspecified: Secondary | ICD-10-CM | POA: Diagnosis not present

## 2019-01-07 DIAGNOSIS — F5105 Insomnia due to other mental disorder: Secondary | ICD-10-CM | POA: Diagnosis not present

## 2019-01-07 MED ORDER — LORAZEPAM 0.5 MG PO TABS: 1.5000 mg | ORAL_TABLET | Freq: Every day | ORAL | 1 refills | Status: DC

## 2019-01-07 NOTE — Progress Notes (Signed)
Jaliel Deavers Raper [id number] 10/25/1955 63 y.o.  Subjective:   Patient ID:  Vale Mousseau Whetstone is a 63 y.o. (DOB 04/20/1956) male.  Chief Complaint:  Chief Complaint  Patient presents with  . Follow-up    bipolar disorder    HPI Aedyn Kempfer Dortch presents to the office today for follow-up of bipolar disorder and sleep.    Last seen June 12, 2018.  No meds were changed.  Been doing pretty well but a lot of changes with Covid and creates mild sense of dread and worry.  Has been able to keep going forward.  Would like to retire sooner than 66 but not sure.  Would stay busy with retirement.    More nights lately of EMA.  Has to get up anyway bc nocturia and now having trouble going back too sleep for the last 3-4 mos. 2 nights out of the week.  Most of the time feeling good.  No significant mood swings No unusual fear.  Sleep is better witn increase in lorazepam without awakening.  Active.  Patient reports stable mood and denies depressed.  Patient denies difficulty with sleep initiation. Denies appetite disturbance.  Patient reports that energy and motivation have been good.  Patient denies any difficulty with concentration.  Patient denies any suicidal ideation.  Retirement probably 3-4 years off.  Past Psychiatric Medication Trials: Risperidone lithium, amiloride, lorazepam, Geodon sedation, haloperidol, Prolixin, Trileptal, gabapentin, sertraline, Wellbutrin  He has been under our care since December 1994.  He has had psychiatric hospitalization once.  He has been stable on lithium and risperidone since about 2007  Review of Systems:  Review of Systems  Genitourinary: Positive for frequency.  Neurological: Negative for tremors and weakness.  Psychiatric/Behavioral: Positive for sleep disturbance. Negative for agitation, behavioral problems, confusion, decreased concentration, dysphoric mood, hallucinations, self-injury and suicidal ideas. The patient is not nervous/anxious and is not  hyperactive.     Medications: I have reviewed the patient's current medications.  Current Outpatient Medications  Medication Sig Dispense Refill  . Dulaglutide (TRULICITY [place]) Inject into the skin.    . felodipine (PLENDIL) 5 MG 24 hr tablet 5 mg daily.   4  . levothyroxine (SYNTHROID, LEVOTHROID) 88 MCG tablet Take 88 mcg by mouth daily.  5  . lisinopril (PRINIVIL,ZESTRIL) 20 MG tablet Take 20 mg by mouth daily.  5  . lithium carbonate (ESKALITH) 450 MG CR tablet Take 1.5 tablets by mouth every morning and 2 tablets in the evening 315 tablet 1  . LORazepam (ATIVAN) 0.5 MG tablet Take 3 tablets (1.5 mg total) by mouth at bedtime. 135 tablet 1  . meloxicam (MOBIC) 15 MG tablet Take 1 tablet (15 mg total) by mouth daily. 30 tablet 3  . metFORMIN (GLUCOPHAGE-XR) 500 MG 24 hr tablet Take 1,000 mg by mouth daily.  5  . risperiDONE (RISPERDAL) 1 MG tablet Take 1 tablet (1 mg total) by mouth at bedtime. 90 tablet 0  . empagliflozin (JARDIANCE) 25 MG TABS tablet Take 25 mg by mouth daily.     Current Facility-Administered Medications  Medication Dose Route Frequency Provider Last Rate Last Dose  . acetaminophen (TYLENOL) tablet 500 mg  500 mg Oral Once Ratcliffe, Heather R, PA-C        Medication Side Effects: None  Allergies:  Allergies  Allergen Reactions  . Erythromycin Ethylsuccinate Rash  . Simvastatin Other (See Comments) and Rash    Past Medical History:  Diagnosis Date  . Depression   . Diabetes mellitus without  complication ([place])    type 2  . Dysrhythmia   . Hyperlipidemia   . Hypertension   . Hypothyroidism   . Sleep apnea    on CPAP    History reviewed. No pertinent family history.  Social History   Socioeconomic History  . Marital status: Married    Spouse name: Not on file  . Number of children: Not on file  . Years of education: Not on file  . Highest education level: Not on file  Occupational History  . Not on file  Social Needs  . Financial resource  strain: Not on file  . Food insecurity    Worry: Not on file    Inability: Not on file  . Transportation needs    Medical: Not on file    Non-medical: Not on file  Tobacco Use  . Smoking status: Former Research scientist (life sciences)  . Smokeless tobacco: Never Used  . Tobacco comment: quit 25 years  Substance and Sexual Activity  . Alcohol use: Yes    Alcohol/week: 4.0 standard drinks    Types: 4 Cans of beer per week  . Drug use: Never  . Sexual activity: Not on file  Lifestyle  . Physical activity    Days per week: Not on file    Minutes per session: Not on file  . Stress: Not on file  Relationships  . Social Herbalist on phone: Not on file    Gets together: Not on file    Attends religious service: Not on file    Active member of club or organization: Not on file    Attends meetings of clubs or organizations: Not on file    Relationship status: Not on file  . Intimate partner violence    Fear of current or ex partner: Not on file    Emotionally abused: Not on file    Physically abused: Not on file    Forced sexual activity: Not on file  Other Topics Concern  . Not on file  Social History Narrative  . Not on file    Past Medical History, Surgical history, Social history, and Family history were reviewed and updated as appropriate.   Please see review of systems for further details on the patient's review from today.   Objective:   Physical Exam:  There were no vitals taken for this visit.  Physical Exam Constitutional:      General: He is not in acute distress.    Appearance: He is well-developed.  Musculoskeletal:        General: No deformity.  Neurological:     Mental Status: He is alert and oriented to person, place, and time.     Motor: No tremor.     Coordination: Coordination normal.     Gait: Gait normal.  Psychiatric:        Attention and Perception: Attention and perception normal.        Mood and Affect: Mood is not anxious or depressed. Affect is not  labile, blunt, angry or inappropriate.        Speech: Speech normal.        Behavior: Behavior normal.        Thought Content: Thought content normal. Thought content does not include homicidal or suicidal ideation. Thought content does not include homicidal or suicidal plan.        Cognition and Memory: Cognition normal.        Judgment: Judgment normal.     Comments: Insight  intact. No auditory or visual hallucinations. No delusions.      Lab Review:     Component Value Date/Time   NA 140 12/28/2018 0741   K 4.8 12/28/2018 0741   CL 106 12/28/2018 0741   CO2 19 (L) 12/28/2018 0741   GLUCOSE 228 (H) 12/28/2018 0741   BUN 21 12/28/2018 0741   CREATININE 1.10 12/28/2018 0741   CALCIUM 9.4 12/28/2018 0741   GFRNONAA 72 12/28/2018 0741   GFRAA 83 12/28/2018 0741    No results found for: WBC, RBC, HGB, HCT, PLT, MCV, MCH, MCHC, RDW, LYMPHSABS, MONOABS, EOSABS, BASOSABS  Lithium Lvl  Date Value Ref Range Status  12/28/2018 1.0 0.6 - 1.2 mmol/L Final    Comment:                                     Detection Limit = 0.1                           <0.1 indicates None Detected      No results found for: PHENYTOIN, PHENOBARB, VALPROATE, CBMZ   Last BMP was January 31, 2018 and was within normal limits except for glucose 227, TSH was 4.64, and lithium level was 0.8 which is stable.  .res Assessment: Plan:    Chosen was seen today for follow-up.  Diagnoses and all orders for this visit:  Bipolar I disorder ([place])  Insomnia due to mental condition   Long term benefit lithium and risperidone and is stable with past history of psychotic features.    We discussed the short-term risks associated with benzodiazepines including sedation and increased fall risk among others.  Discussed long-term side effect risk including dependence, potential withdrawal symptoms, hangover effects and tolerance, and the potential eventual dose-related risk of dementia.    Insomnia relieved with  increase lorazepam to 1.5 mg HS.[name]  Need to treat the insomnia because it is a risk factor for mania.  "If my sleep is right I don't have any problems"  Labs good with normal lithium, BMP and TSH.  Counseled patient regarding potential benefits, risks, and side effects of lithium to include potential risk of lithium affecting thyroid and renal function.  Discussed need for periodic lab monitoring to determine drug level and to assess for potential adverse effects.  Counseled patient regarding signs and symptoms of lithium toxicity and advised that they notify office immediately or seek urgent medical attention if experiencing these signs and symptoms.  Patient advised to contact office with any questions or concerns. \ No med changes  FU 6 mos  Lynder Parents, MD, DFAPA    Please see After Visit Summary for patient specific instructions.  Future Appointments  Date Time Provider [place]  01/30/2019  8:50 AM Hyatt, Max T, DPM [id number]    No orders of the defined types were placed in this encounter.     -------------------------------

## 2019-01-19 ENCOUNTER — Other Ambulatory Visit: Payer: Self-pay | Admitting: Psychiatry

## 2019-01-30 ENCOUNTER — Ambulatory Visit: Payer: BC Managed Care – PPO | Admitting: Podiatry

## 2019-02-06 ENCOUNTER — Other Ambulatory Visit: Payer: Self-pay | Admitting: Psychiatry

## 2019-03-07 ENCOUNTER — Encounter: Payer: Self-pay | Admitting: Psychiatry

## 2019-03-27 ENCOUNTER — Ambulatory Visit: Payer: BC Managed Care – PPO

## 2019-03-27 ENCOUNTER — Other Ambulatory Visit: Payer: Self-pay

## 2019-03-27 DIAGNOSIS — Z23 Encounter for immunization: Secondary | ICD-10-CM

## 2019-05-08 ENCOUNTER — Other Ambulatory Visit: Payer: Self-pay

## 2019-05-08 ENCOUNTER — Ambulatory Visit: Payer: BC Managed Care – PPO | Admitting: Medical

## 2019-05-08 ENCOUNTER — Encounter: Payer: Self-pay | Admitting: Medical

## 2019-05-08 DIAGNOSIS — L089 Local infection of the skin and subcutaneous tissue, unspecified: Secondary | ICD-10-CM

## 2019-05-08 DIAGNOSIS — E11628 Type 2 diabetes mellitus with other skin complications: Secondary | ICD-10-CM

## 2019-05-08 MED ORDER — CEFTRIAXONE SODIUM 1 G IJ SOLR
1.0000 g | Freq: Once | INTRAMUSCULAR | Status: AC
  Administered 2019-05-08: 1 g via INTRAMUSCULAR

## 2019-05-08 MED ORDER — AMOXICILLIN-POT CLAVULANATE 875-125 MG PO TABS: 1.0000 | ORAL_TABLET | Freq: Two times a day (BID) | ORAL | 0 refills | Status: DC

## 2019-05-08 NOTE — Progress Notes (Signed)
   63 yo male in non acute distress.Comes in today due to skin infection on left lower leg (laterally). He scraped it superficially on Sunday . He has been keeping it clean and using Neosporin to the site. He denies fever or chills or discharge from the site. Some tenderness today. Not on blood thinners or ASA.  Vitals: T 98.0  PE: Review of Systems  Constitutional: Negative.   Skin:       Wound and erythema on left lower leg laterally.   Lateral left leg-Size of erythema 6x5cm located on the lateral lower left leg. A very superficial skin flap is noted about the size of a quarter.  No discharge or odor noted.  Gait wnl.  Also noted are several scratches on the forearms ( he states it was from [employer] and he ran into some briar bushes).  Dx/Plan: Skin infection  Left lower leg laterally, multiple scratches on forearms with crusting, Bilaterally. Diabetic Mellitus type  2  Rocephin 1 gram IM buttock. Meds ordered this encounter  Medications  . cefTRIAXone (ROCEPHIN) injection 1 g  . amoxicillin-clavulanate (AUGMENTIN) 875-125 MG tablet    Sig: Take 1 tablet by mouth 2 (two) times daily. Take with food    Dispense:  20 tablet    Refill:  0  Patient has appointment with his PCP on Friday.So he will need follow up. If worse tomorrow patient is to call the office, at which point I would suggest ED visit.  Reviewed wound care and bandaging, no adhesive to the skin.Given some self adhering tape. Patient verbalizes understanding and has no questions at discharge.

## 2019-05-09 ENCOUNTER — Telehealth: Payer: BC Managed Care – PPO | Admitting: Medical

## 2019-06-17 ENCOUNTER — Other Ambulatory Visit: Payer: Self-pay | Admitting: Psychiatry

## 2019-06-17 DIAGNOSIS — F5105 Insomnia due to other mental disorder: Secondary | ICD-10-CM

## 2019-07-01 ENCOUNTER — Ambulatory Visit: Payer: BC Managed Care – PPO | Admitting: Psychiatry

## 2019-07-08 ENCOUNTER — Encounter: Payer: Self-pay | Admitting: Psychiatry

## 2019-07-08 ENCOUNTER — Ambulatory Visit (INDEPENDENT_AMBULATORY_CARE_PROVIDER_SITE_OTHER): Payer: BC Managed Care – PPO | Admitting: Psychiatry

## 2019-07-08 ENCOUNTER — Other Ambulatory Visit: Payer: Self-pay

## 2019-07-08 ENCOUNTER — Ambulatory Visit: Payer: BC Managed Care – PPO | Admitting: Psychiatry

## 2019-07-08 DIAGNOSIS — F319 Bipolar disorder, unspecified: Secondary | ICD-10-CM | POA: Diagnosis not present

## 2019-07-08 DIAGNOSIS — F5105 Insomnia due to other mental disorder: Secondary | ICD-10-CM | POA: Diagnosis not present

## 2019-07-08 NOTE — Progress Notes (Signed)
Carl Melton [id number] Aug 04, 1955 63 y.o.  Subjective:   Patient ID:  Carl Melton is a 63 y.o. (DOB [birth year]) male.  Chief Complaint:  Chief Complaint  Patient presents with  . Follow-up    Medication Mangement  . Other    Medication Mangement    HPI Carl Melton presents to the office today for follow-up of bipolar disorder and sleep.    Last seen June 2020.  No meds were changed.  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    Mood been OK.  Only thing he notices is tremor with fine motor things at times.  OK typing.  Done well with work.  Has been able to keep going forward.  Would like to retire sooner than 66 but not sure.  Would stay busy with retirement.    Some EMA wild wide awake.  Can get back to sleep in an hour.  OK during the day.  Has to get up anyway bc nocturia and now having trouble going back too sleep for the last 3-4 mos. 2 nights out of the week.  Most of the time feeling good.  No significant mood swings No unusual fear.  Sleep is better witn increase in lorazepam without awakening.  Active.  Patient reports stable mood and denies depressed.  Patient denies difficulty with sleep initiation. Denies appetite disturbance.  Patient reports that energy and motivation have been good.  Patient denies any difficulty with concentration.  Patient denies any suicidal ideation.   Physical health stable.  Retirement probably 3-4 years off DT insurance.  Past Psychiatric Medication Trials: Risperidone lithium, amiloride, lorazepam, Geodon sedation, haloperidol, Prolixin, Trileptal, gabapentin, sertraline, Wellbutrin  He has been under our care since December 1994.  He has had psychiatric hospitalization once.  He has been stable on lithium and risperidone since about 2007  Review of Systems:  Review of Systems  HENT: Positive for hearing loss.   Genitourinary: Positive for frequency.  Neurological: Negative  for tremors and weakness.  Psychiatric/Behavioral: Positive for sleep disturbance. Negative for agitation, behavioral problems, confusion, decreased concentration, dysphoric mood, hallucinations, self-injury and suicidal ideas. The patient is not nervous/anxious and is not hyperactive.     Medications: I have reviewed the patient's current medications.  Current Outpatient Medications  Medication Sig Dispense Refill  . amoxicillin-clavulanate (AUGMENTIN) 875-125 MG tablet Take 1 tablet by mouth 2 (two) times daily. Take with food 20 tablet 0  . Dulaglutide (TRULICITY [place]) Inject into the skin.    [name] empagliflozin (JARDIANCE) 25 MG TABS tablet Take 25 mg by mouth daily.    . felodipine (PLENDIL) 5 MG 24 hr tablet 5 mg daily.   4  . levothyroxine (SYNTHROID, LEVOTHROID) 88 MCG tablet Take 88 mcg by mouth daily.  5  . lisinopril (PRINIVIL,ZESTRIL) 20 MG tablet Take 20 mg by mouth daily.  5  . lithium carbonate (ESKALITH) 450 MG CR tablet TAKE 1.5 TABLETS BY MOUTH EVERY MORNING AND 2 TABLETS IN THE EVENING 315 tablet 1  . loratadine (CLARITIN) 10 MG tablet Take by mouth.    [name] LORazepam (ATIVAN) 0.5 MG tablet TAKE 3 TABLETS (1.5 MG TOTAL) BY MOUTH AT BEDTIME. 135 tablet 1  . meloxicam (MOBIC) 15 MG tablet Take 1 tablet (15 mg total) by mouth daily. 30 tablet 3  . metFORMIN (GLUCOPHAGE-XR) 500 MG 24 hr tablet Take 1,000 mg by mouth daily.  5  . risperiDONE (RISPERDAL) 1 MG  tablet TAKE 1 TABLET BY MOUTH AT BEDTIME 90 tablet 1   Current Facility-Administered Medications  Medication Dose Route Frequency Provider Last Rate Last Admin  . acetaminophen (TYLENOL) tablet 500 mg  500 mg Oral Once Ratcliffe, Heather R, PA-C        Medication Side Effects: None  Allergies:  Allergies  Allergen Reactions  . Erythromycin Ethylsuccinate Rash  . Simvastatin Other (See Comments) and Rash    Past Medical History:  Diagnosis Date  . Depression   . Diabetes mellitus without complication ([place])    type 2   . Dysrhythmia   . Hyperlipidemia   . Hypertension   . Hypothyroidism   . Sleep apnea    on CPAP    History reviewed. No pertinent family history.  Social History   Socioeconomic History  . Marital status: Married    Spouse name: Not on file  . Number of children: Not on file  . Years of education: Not on file  . Highest education level: Not on file  Occupational History  . Not on file  Tobacco Use  . Smoking status: Former Research scientist (life sciences)  . Smokeless tobacco: Never Used  . Tobacco comment: quit 25 years  Substance and Sexual Activity  . Alcohol use: Yes    Alcohol/week: 4.0 standard drinks    Types: 4 Cans of beer per week  . Drug use: Never  . Sexual activity: Not on file  Other Topics Concern  . Not on file  Social History Narrative  . Not on file   Social Determinants of Health   Financial Resource Strain:   . Difficulty of Paying Living Expenses: Not on file  Food Insecurity:   . Worried About Charity fundraiser in the Last Year: Not on file  . Ran Out of Food in the Last Year: Not on file  Transportation Needs:   . Lack of Transportation (Medical): Not on file  . Lack of Transportation (Non-Medical): Not on file  Physical Activity:   . Days of Exercise per Week: Not on file  . Minutes of Exercise per Session: Not on file  Stress:   . Feeling of Stress : Not on file  Social Connections:   . Frequency of Communication with Friends and Family: Not on file  . Frequency of Social Gatherings with Friends and Family: Not on file  . Attends Religious Services: Not on file  . Active Member of Clubs or Organizations: Not on file  . Attends Archivist Meetings: Not on file  . Marital Status: Not on file  Intimate Partner Violence:   . Fear of Current or Ex-Partner: Not on file  . Emotionally Abused: Not on file  . Physically Abused: Not on file  . Sexually Abused: Not on file    Past Medical History, Surgical history, Social history, and Family history  were reviewed and updated as appropriate.   Please see review of systems for further details on the patient's review from today.   Objective:   Physical Exam:  There were no vitals taken for this visit.  Physical Exam Constitutional:      General: He is not in acute distress.    Appearance: He is well-developed.  Musculoskeletal:        General: No deformity.  Neurological:     Mental Status: He is alert and oriented to person, place, and time.     Motor: No tremor.     Coordination: Coordination normal.  Gait: Gait normal.  Psychiatric:        Attention and Perception: Attention and perception normal.        Mood and Affect: Mood is not anxious or depressed. Affect is not labile, blunt, angry or inappropriate.        Speech: Speech normal.        Behavior: Behavior normal.        Thought Content: Thought content normal. Thought content does not include homicidal or suicidal ideation. Thought content does not include homicidal or suicidal plan.        Cognition and Memory: Cognition normal.        Judgment: Judgment normal.     Comments: Insight intact. No auditory or visual hallucinations. No delusions.      Lab Review:     Component Value Date/Time   NA 140 12/28/2018 0741   K 4.8 12/28/2018 0741   CL 106 12/28/2018 0741   CO2 19 (L) 12/28/2018 0741   GLUCOSE 228 (H) 12/28/2018 0741   BUN 21 12/28/2018 0741   CREATININE 1.10 12/28/2018 0741   CALCIUM 9.4 12/28/2018 0741   GFRNONAA 72 12/28/2018 0741   GFRAA 83 12/28/2018 0741    No results found for: WBC, RBC, HGB, HCT, PLT, MCV, MCH, MCHC, RDW, LYMPHSABS, MONOABS, EOSABS, BASOSABS  Lithium Lvl  Date Value Ref Range Status  12/28/2018 1.0 0.6 - 1.2 mmol/L Final    Comment:                                     Detection Limit = 0.1                           <0.1 indicates None Detected      No results found for: PHENYTOIN, PHENOBARB, VALPROATE, CBMZ   Last BMP was January 31, 2018 and was within normal  limits except for glucose 227, TSH was 4.64, and lithium level was 0.8 which is stable.  .res Assessment: Plan:    Carl Melton was seen today for follow-up and other.  Diagnoses and all orders for this visit:  Bipolar I disorder ([place]) -     Cancel: Lithium level -     Lithium level  Insomnia due to mental condition   Long term benefit lithium and risperidone and is stable with past history of psychotic features.    We discussed the short-term risks associated with benzodiazepines including sedation and increased fall risk among others.  Discussed long-term side effect risk including dependence, potential withdrawal symptoms, hangover effects and tolerance, and the potential eventual dose-related risk of dementia.    Insomnia relieved with increase lorazepam to 1.5 mg HS.[name]  Need to treat the insomnia because it is a risk factor for mania.  "If my sleep is right I don't have any problems"  Labs good with normal lithium, BMP and TSH in the summer.  Counseled patient regarding potential benefits, risks, and side effects of lithium to include potential risk of lithium affecting thyroid and renal function.  Discussed need for periodic lab monitoring to determine drug level and to assess for potential adverse effects.  Counseled patient regarding signs and symptoms of lithium toxicity and advised that they notify office immediately or seek urgent medical attention if experiencing these signs and symptoms.  Patient advised to contact office with any questions or concerns.  No med changes  Check lithium level  FU 6 mos  Lynder Parents, MD, DFAPA    Please see After Visit Summary for patient specific instructions.  No future appointments.  Orders Placed This Encounter  Procedures  . Lithium level      -------------------------------

## 2019-07-18 ENCOUNTER — Other Ambulatory Visit: Payer: Self-pay | Admitting: Psychiatry

## 2019-07-19 ENCOUNTER — Encounter: Payer: Self-pay | Admitting: Psychiatry

## 2019-07-19 LAB — BASIC METABOLIC PANEL
BUN/Creatinine Ratio: 16 (ref 10–24)
BUN: 18 mg/dL (ref 8–27)
CO2: 20 mmol/L (ref 20–29)
Calcium: 10 mg/dL (ref 8.6–10.2)
Chloride: 103 mmol/L (ref 96–106)
Creatinine, Ser: 1.1 mg/dL (ref 0.76–1.27)
GFR calc Af Amer: 83 mL/min/{1.73_m2} (ref >59)
GFR calc non Af Amer: 72 mL/min/{1.73_m2} (ref >59)
Glucose: 250 mg/dL — ABNORMAL HIGH (ref 65–99)
Potassium: 4.5 mmol/L (ref 3.5–5.2)
Sodium: 138 mmol/L (ref 134–144)

## 2019-07-19 LAB — TSH: TSH: 3.69 u[IU]/mL (ref 0.450–4.500)

## 2019-07-19 LAB — LITHIUM LEVEL: Lithium Lvl: 1.2 mmol/L (ref 0.6–1.2)

## 2019-07-21 ENCOUNTER — Other Ambulatory Visit: Payer: Self-pay | Admitting: Psychiatry

## 2019-07-24 NOTE — Progress Notes (Signed)
Blood sugar was high but patient is aware that he has diabetes.  He was not asked to fast.  Calcium and kidney function and lithium level are all within normal limits.  No med changes indicated

## 2019-07-26 ENCOUNTER — Other Ambulatory Visit: Payer: Self-pay | Admitting: Psychiatry

## 2019-08-10 ENCOUNTER — Other Ambulatory Visit: Payer: Self-pay | Admitting: Podiatry

## 2019-09-22 ENCOUNTER — Other Ambulatory Visit: Payer: Self-pay | Admitting: Psychiatry

## 2019-09-22 DIAGNOSIS — F5105 Insomnia due to other mental disorder: Secondary | ICD-10-CM

## 2019-09-23 NOTE — Telephone Encounter (Signed)
Last refill 08/08/2019, quantity gets him through for 45 days. Scheduled back in June

## 2019-12-15 ENCOUNTER — Other Ambulatory Visit: Payer: Self-pay | Admitting: Psychiatry

## 2019-12-15 DIAGNOSIS — F5105 Insomnia due to other mental disorder: Secondary | ICD-10-CM

## 2019-12-16 ENCOUNTER — Other Ambulatory Visit: Payer: Self-pay

## 2019-12-16 ENCOUNTER — Other Ambulatory Visit
Admission: RE | Admit: 2019-12-16 | Discharge: 2019-12-16 | Disposition: A | Discharge status: Home / Self Care | Payer: BC Managed Care – PPO | Source: Ambulatory Visit | Attending: Internal Medicine | Admitting: Internal Medicine

## 2019-12-16 DIAGNOSIS — Z01812 Encounter for preprocedural laboratory examination: Secondary | ICD-10-CM | POA: Insufficient documentation

## 2019-12-16 DIAGNOSIS — Z20822 Contact with and (suspected) exposure to covid-19: Secondary | ICD-10-CM | POA: Diagnosis not present

## 2019-12-16 LAB — SARS CORONAVIRUS 2 (TAT 6-24 HRS): SARS Coronavirus 2: NEGATIVE

## 2019-12-16 NOTE — Telephone Encounter (Signed)
Next apt 06/28

## 2019-12-17 ENCOUNTER — Encounter: Payer: Self-pay | Admitting: Internal Medicine

## 2019-12-18 ENCOUNTER — Ambulatory Visit
Admission: RE | Admit: 2019-12-18 | Payer: BC Managed Care – PPO | Source: Home / Self Care | Admitting: Internal Medicine

## 2019-12-18 ENCOUNTER — Encounter: Admission: RE | Payer: Self-pay | Source: Home / Self Care

## 2019-12-18 HISTORY — DX: Morbid (severe) obesity due to excess calories: E66.01

## 2019-12-18 HISTORY — DX: Nonalcoholic steatohepatitis (NASH): K75.81

## 2019-12-18 HISTORY — DX: Cardiomegaly: I51.7

## 2019-12-18 HISTORY — DX: Bipolar disorder, unspecified: F31.9

## 2019-12-18 SURGERY — COLONOSCOPY WITH PROPOFOL
Anesthesia: General

## 2020-01-06 ENCOUNTER — Encounter: Payer: Self-pay | Admitting: Psychiatry

## 2020-01-06 ENCOUNTER — Other Ambulatory Visit: Payer: Self-pay | Admitting: Psychiatry

## 2020-01-06 ENCOUNTER — Ambulatory Visit (INDEPENDENT_AMBULATORY_CARE_PROVIDER_SITE_OTHER): Payer: BC Managed Care – PPO | Admitting: Psychiatry

## 2020-01-06 ENCOUNTER — Other Ambulatory Visit: Payer: Self-pay

## 2020-01-06 DIAGNOSIS — F319 Bipolar disorder, unspecified: Secondary | ICD-10-CM | POA: Diagnosis not present

## 2020-01-06 DIAGNOSIS — F5105 Insomnia due to other mental disorder: Secondary | ICD-10-CM | POA: Diagnosis not present

## 2020-01-06 MED ORDER — LITHIUM CARBONATE ER 450 MG PO TBCR: EXTENDED_RELEASE_TABLET | ORAL | 1 refills | Status: DC

## 2020-01-06 MED ORDER — RISPERIDONE 1 MG PO TABS: 1.0000 mg | ORAL_TABLET | Freq: Every day | ORAL | 1 refills | Status: DC

## 2020-01-06 MED ORDER — LORAZEPAM 0.5 MG PO TABS: 1.5000 mg | ORAL_TABLET | Freq: Every day | ORAL | 1 refills | Status: DC

## 2020-01-06 NOTE — Progress Notes (Signed)
Carl Melton [id number] [birth year] 64 y.o.  Subjective:   Patient ID:  Carl Melton is a 64 y.o. (DOB 1956/05/02) male.  Chief Complaint:  Chief Complaint  Patient presents with  . Follow-up    Bipolar and sleep    HPI Carl Melton File presents to the office today for follow-up of bipolar disorder and sleep.    Last seen December 2020.  No meds were changed.  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    01/06/2020 appointment with the following noted: Continues lithium 450 mg 3 and 1/2 tablets daily, lorazepam 1.5 mg HS, Risperdal 1 mg HS. Vaccinated. OK overall.  Doing job well with good reports but getting harder to manage multiple pieces.  Want to work 2 more years after this one.  Has good assistants.  Has good rapport with coworkers.   Mood been OK.  Only thing Carl Melton notices is tremor with fine motor things at times.  OK typing.  Done well with work.  Has been able to keep going forward.  Would like to retire sooner than 66 but not sure.  Would stay busy with retirement.    Sleep is pretty good and naps after work.  OK during the day.  Has to get up anyway bc nocturia and now having trouble going back too sleep for the last 3-4 mos. 2 nights out of the week.  Most of the time feeling good.  No significant mood swings No unusual fear.  Sleep is better witn increase in lorazepam without awakening.  Active.  Patient reports stable mood and denies depressed.  Patient denies difficulty with sleep initiation. Denies appetite disturbance.  Patient reports that energy and motivation have been good.  Patient denies any difficulty with concentration.  Patient denies any suicidal ideation.   Physical health stable.  Wife MI Mother's Day 2021. Doing OK.  Past Psychiatric Medication Trials: Risperidone lithium, amiloride, lorazepam, Geodon sedation, haloperidol, Prolixin, Trileptal, gabapentin, sertraline, Wellbutrin  Carl Melton has been  under our care since December 1994.  Carl Melton has had psychiatric hospitalization once.  Carl Melton has been stable on lithium and risperidone since about 2007  Review of Systems:  Review of Systems  HENT: Positive for hearing loss.   Genitourinary: Positive for frequency.  Neurological: Negative for tremors and weakness.  Psychiatric/Behavioral: Positive for sleep disturbance. Negative for agitation, behavioral problems, confusion, decreased concentration, dysphoric mood, hallucinations, self-injury and suicidal ideas. The patient is not nervous/anxious and is not hyperactive.     Medications: I have reviewed the patient's current medications.  Current Outpatient Medications  Medication Sig Dispense Refill  . amoxicillin-clavulanate (AUGMENTIN) 875-125 MG tablet Take 1 tablet by mouth 2 (two) times daily. Take with food 20 tablet 0  . Dulaglutide (TRULICITY [place]) Inject into the skin.    [name] empagliflozin (JARDIANCE) 25 MG TABS tablet Take 25 mg by mouth daily.    . felodipine (PLENDIL) 5 MG 24 hr tablet 5 mg daily.   4  . levothyroxine (SYNTHROID, LEVOTHROID) 88 MCG tablet Take 88 mcg by mouth daily.  5  . lisinopril (PRINIVIL,ZESTRIL) 20 MG tablet Take 20 mg by mouth daily.  5  . lithium carbonate (ESKALITH) 450 MG CR tablet 1 and 1/2 tablets in the morning and 2 tablets at night 315 tablet 1  . loratadine (CLARITIN) 10 MG tablet Take by mouth.    [name] LORazepam (ATIVAN) 0.5 MG tablet Take 3 tablets (1.5 mg  total) by mouth at bedtime. 270 tablet 1  . meloxicam (MOBIC) 15 MG tablet TAKE 1 TABLET BY MOUTH EVERY DAY 30 tablet 3  . metFORMIN (GLUCOPHAGE-XR) 500 MG 24 hr tablet Take 1,000 mg by mouth daily.  5  . pioglitazone (ACTOS) 30 MG tablet Take 30 mg by mouth daily.    . risperiDONE (RISPERDAL) 1 MG tablet Take 1 tablet (1 mg total) by mouth at bedtime. 90 tablet 1  . rosuvastatin (CRESTOR) 20 MG tablet Take 20 mg by mouth daily.     Current Facility-Administered Medications  Medication Dose Route  Frequency Provider Last Rate Last Admin  . acetaminophen (TYLENOL) tablet 500 mg  500 mg Oral Once Ratcliffe, Heather R, PA-C        Medication Side Effects: None  Allergies:  Allergies  Allergen Reactions  . Erythromycin Ethylsuccinate Rash  . Simvastatin Other (See Comments) and Rash    Past Medical History:  Diagnosis Date  . Bipolar 1 disorder ([place])   . Depression   . Diabetes mellitus without complication ([place])    type 2  . Dysrhythmia   . Hyperlipidemia   . Hypertension   . Hypothyroidism   . LVH (left ventricular hypertrophy)   . Morbid obesity ([place])   . Sleep apnea    on CPAP  . Steatohepatitis     History reviewed. No pertinent family history.  Social History   Socioeconomic History  . Marital status: Married    Spouse name: Not on file  . Number of children: Not on file  . Years of education: Not on file  . Highest education level: Not on file  Occupational History  . Not on file  Tobacco Use  . Smoking status: Former Research scientist (life sciences)  . Smokeless tobacco: Never Used  . Tobacco comment: quit 25 years  Vaping Use  . Vaping Use: Never used  Substance and Sexual Activity  . Alcohol use: Yes    Alcohol/week: 4.0 standard drinks    Types: 4 Cans of beer per week  . Drug use: Never  . Sexual activity: Not on file  Other Topics Concern  . Not on file  Social History Narrative  . Not on file   Social Determinants of Health   Financial Resource Strain:   . Difficulty of Paying Living Expenses:   Food Insecurity:   . Worried About Charity fundraiser in the Last Year:   . Arboriculturist in the Last Year:   Transportation Needs:   . Film/video editor (Medical):   [name] Lack of Transportation (Non-Medical):   Physical Activity:   . Days of Exercise per Week:   . Minutes of Exercise per Session:   Stress:   . Feeling of Stress :   Social Connections:   . Frequency of Communication with Friends and Family:   . Frequency of Social Gatherings with Friends  and Family:   . Attends Religious Services:   . Active Member of Clubs or Organizations:   . Attends Archivist Meetings:   [name] Marital Status:   Intimate Partner Violence:   . Fear of Current or Ex-Partner:   . Emotionally Abused:   [name] Physically Abused:   . Sexually Abused:     Past Medical History, Surgical history, Social history, and Family history were reviewed and updated as appropriate.   Please see review of systems for further details on the patient's review from today.   Objective:   Physical Exam:  There  were no vitals taken for this visit.  Physical Exam Constitutional:      General: Carl Melton is not in acute distress.    Appearance: Carl Melton is well-developed. Carl Melton is obese.  Musculoskeletal:        General: No deformity.  Neurological:     Mental Status: Carl Melton is alert and oriented to person, place, and time.     Motor: No tremor.     Coordination: Coordination normal.     Gait: Gait normal.  Psychiatric:        Attention and Perception: Attention and perception normal.        Mood and Affect: Mood is not anxious or depressed. Affect is not labile, blunt, angry or inappropriate.        Speech: Speech normal.        Behavior: Behavior normal.        Thought Content: Thought content normal. Thought content is not paranoid. Thought content does not include homicidal or suicidal ideation. Thought content does not include homicidal or suicidal plan.        Cognition and Memory: Cognition normal.        Judgment: Judgment normal.     Comments: Insight intact. No auditory or visual hallucinations. No delusions.      Lab Review:     Component Value Date/Time   NA 138 07/18/2019 0709   K 4.5 07/18/2019 0709   CL 103 07/18/2019 0709   CO2 20 07/18/2019 0709   GLUCOSE 250 (H) 07/18/2019 0709   BUN 18 07/18/2019 0709   CREATININE 1.10 07/18/2019 0709   CALCIUM 10.0 07/18/2019 0709   GFRNONAA 72 07/18/2019 0709   GFRAA 83 07/18/2019 0709    No results found for:  WBC, RBC, HGB, HCT, PLT, MCV, MCH, MCHC, RDW, LYMPHSABS, MONOABS, EOSABS, BASOSABS  Lithium Lvl  Date Value Ref Range Status  07/18/2019 1.2 0.6 - 1.2 mmol/L Final    Comment:                                     Detection Limit = 0.1                           <0.1 indicates None Detected      No results found for: PHENYTOIN, PHENOBARB, VALPROATE, CBMZ   Last BMP was January 31, 2018 and was within normal limits except for glucose 227, TSH was 4.64, and lithium level was 0.8 which is stable.  .res Assessment: Plan:    Carl Melton was seen today for follow-up.  Diagnoses and all orders for this visit:  Bipolar I disorder ([place]) -     risperiDONE (RISPERDAL) 1 MG tablet; Take 1 tablet (1 mg total) by mouth at bedtime. -     lithium carbonate (ESKALITH) 450 MG CR tablet; 1 and 1/2 tablets in the morning and 2 tablets at night  Insomnia due to mental condition -     LORazepam (ATIVAN) 0.5 MG tablet; Take 3 tablets (1.5 mg total) by mouth at bedtime.   Long term benefit lithium and risperidone and is stable with past history of psychotic features if not on antipsychotic.  We discussed the short-term risks associated with benzodiazepines including sedation and increased fall risk among others.  Discussed long-term side effect risk including dependence, potential withdrawal symptoms, and the potential eventual dose-related risk of dementia.  But recent studies  from 2020 dispute this association between benzodiazepines and dementia risk. Newer studies in 2020 do not support an association with dementia.  Insomnia relieved with increase lorazepam to 1.5 mg HS.[name]  Need to treat the insomnia because it is a risk factor for mania.  "If my sleep is right I don't have any problems"  Previous Labs good with normal lithium, BMP and TSH in the summer. Lithium level pending from today.  Counseled patient regarding potential benefits, risks, and side effects of lithium to include potential risk of lithium  affecting thyroid and renal function.  Discussed need for periodic lab monitoring to determine drug level and to assess for potential adverse effects.  Counseled patient regarding signs and symptoms of lithium toxicity and advised that they notify office immediately or seek urgent medical attention if experiencing these signs and symptoms.  Patient advised to contact office with any questions or concerns.  No med changes  FU 6 mos  Lynder Parents, MD, DFAPA    Please see After Visit Summary for patient specific instructions.  No future appointments.  No orders of the defined types were placed in this encounter.     -------------------------------

## 2020-01-07 ENCOUNTER — Encounter: Payer: Self-pay | Admitting: Psychiatry

## 2020-01-07 LAB — BASIC METABOLIC PANEL
BUN/Creatinine Ratio: 17 (ref 10–24)
BUN: 18 mg/dL (ref 8–27)
CO2: 17 mmol/L — ABNORMAL LOW (ref 20–29)
Calcium: 9.7 mg/dL (ref 8.6–10.2)
Chloride: 99 mmol/L (ref 96–106)
Creatinine, Ser: 1.08 mg/dL (ref 0.76–1.27)
GFR calc Af Amer: 84 mL/min/{1.73_m2} (ref >59)
GFR calc non Af Amer: 73 mL/min/{1.73_m2} (ref >59)
Glucose: 197 mg/dL — ABNORMAL HIGH (ref 65–99)
Potassium: 4.8 mmol/L (ref 3.5–5.2)
Sodium: 135 mmol/L (ref 134–144)

## 2020-01-07 LAB — TSH: TSH: 4.12 u[IU]/mL (ref 0.450–4.500)

## 2020-01-07 LAB — LITHIUM LEVEL: Lithium Lvl: 1.1 mmol/L (ref 0.5–1.2)

## 2020-01-08 NOTE — Progress Notes (Signed)
Lithium level is stable at 1.1 on a relatively high dose of lithium.  Serum BMP is normal.  No change indicated

## 2020-03-25 SURGERY — COLONOSCOPY WITH PROPOFOL
Anesthesia: General

## 2020-04-08 ENCOUNTER — Ambulatory Visit: Payer: BC Managed Care – PPO | Admitting: Podiatry

## 2020-04-08 ENCOUNTER — Other Ambulatory Visit: Payer: Self-pay | Admitting: Podiatry

## 2020-04-08 ENCOUNTER — Other Ambulatory Visit: Payer: Self-pay

## 2020-04-08 ENCOUNTER — Ambulatory Visit (INDEPENDENT_AMBULATORY_CARE_PROVIDER_SITE_OTHER): Payer: BC Managed Care – PPO

## 2020-04-08 ENCOUNTER — Encounter: Payer: Self-pay | Admitting: Podiatry

## 2020-04-08 DIAGNOSIS — G5792 Unspecified mononeuropathy of left lower limb: Secondary | ICD-10-CM

## 2020-04-08 DIAGNOSIS — E1142 Type 2 diabetes mellitus with diabetic polyneuropathy: Secondary | ICD-10-CM

## 2020-04-08 DIAGNOSIS — M722 Plantar fascial fibromatosis: Secondary | ICD-10-CM

## 2020-04-08 NOTE — Progress Notes (Signed)
He presents today chief complaint of bilateral fifth toes being somewhat numb he is also complaining of not being able to use very easily toes 3 4 and 5 of the bilateral foot left seems to be worse than the right.  Objective: Vital signs are stable he is alert and oriented x3 has good sensation per Semmes Weinstein monofilament pulses remain palpable.  He still has pain on palpation medial calcaneal tubercles bilaterally.  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  Plan: I injected the heels today 20 mg Kenalog 5 mg Marcaine to see if this would alleviate his symptoms hopefully this will do the trick for him he will follow up with me as needed.

## 2020-05-07 ENCOUNTER — Other Ambulatory Visit: Payer: Self-pay

## 2020-05-07 ENCOUNTER — Other Ambulatory Visit
Admission: RE | Admit: 2020-05-07 | Discharge: 2020-05-07 | Disposition: A | Discharge status: Home / Self Care | Payer: BC Managed Care – PPO | Source: Ambulatory Visit | Attending: Gastroenterology | Admitting: Gastroenterology

## 2020-05-07 DIAGNOSIS — Z01812 Encounter for preprocedural laboratory examination: Secondary | ICD-10-CM | POA: Insufficient documentation

## 2020-05-07 DIAGNOSIS — Z20822 Contact with and (suspected) exposure to covid-19: Secondary | ICD-10-CM | POA: Diagnosis not present

## 2020-05-08 ENCOUNTER — Encounter: Payer: Self-pay | Admitting: Emergency Medicine

## 2020-05-08 LAB — SARS CORONAVIRUS 2 (TAT 6-24 HRS): SARS Coronavirus 2: NEGATIVE

## 2020-05-11 ENCOUNTER — Ambulatory Visit: Payer: BC Managed Care – PPO | Admitting: Anesthesiology

## 2020-05-11 ENCOUNTER — Encounter: Payer: Self-pay | Admitting: Anesthesiology

## 2020-05-11 ENCOUNTER — Encounter
Admission: RE | Disposition: A | Discharge status: Home / Self Care | Payer: Self-pay | Source: Home / Self Care | Attending: Gastroenterology

## 2020-05-11 ENCOUNTER — Ambulatory Visit
Admission: RE | Admit: 2020-05-11 | Discharge: 2020-05-11 | Disposition: A | Discharge status: Home / Self Care | Payer: BC Managed Care – PPO | Attending: Gastroenterology | Admitting: Gastroenterology

## 2020-05-11 DIAGNOSIS — E119 Type 2 diabetes mellitus without complications: Secondary | ICD-10-CM | POA: Diagnosis not present

## 2020-05-11 DIAGNOSIS — Z881 Allergy status to other antibiotic agents status: Secondary | ICD-10-CM | POA: Diagnosis not present

## 2020-05-11 DIAGNOSIS — Z86010 Personal history of colonic polyps: Secondary | ICD-10-CM | POA: Insufficient documentation

## 2020-05-11 DIAGNOSIS — Z7984 Long term (current) use of oral hypoglycemic drugs: Secondary | ICD-10-CM | POA: Diagnosis not present

## 2020-05-11 DIAGNOSIS — Z87891 Personal history of nicotine dependence: Secondary | ICD-10-CM | POA: Insufficient documentation

## 2020-05-11 DIAGNOSIS — Z79899 Other long term (current) drug therapy: Secondary | ICD-10-CM | POA: Diagnosis not present

## 2020-05-11 DIAGNOSIS — I10 Essential (primary) hypertension: Secondary | ICD-10-CM | POA: Diagnosis not present

## 2020-05-11 DIAGNOSIS — E785 Hyperlipidemia, unspecified: Secondary | ICD-10-CM | POA: Diagnosis not present

## 2020-05-11 DIAGNOSIS — Z1211 Encounter for screening for malignant neoplasm of colon: Secondary | ICD-10-CM | POA: Insufficient documentation

## 2020-05-11 DIAGNOSIS — Z6841 Body Mass Index (BMI) 40.0 and over, adult: Secondary | ICD-10-CM | POA: Diagnosis not present

## 2020-05-11 DIAGNOSIS — Z888 Allergy status to other drugs, medicaments and biological substances status: Secondary | ICD-10-CM | POA: Diagnosis not present

## 2020-05-11 DIAGNOSIS — K7581 Nonalcoholic steatohepatitis (NASH): Secondary | ICD-10-CM | POA: Diagnosis not present

## 2020-05-11 DIAGNOSIS — Z5309 Procedure and treatment not carried out because of other contraindication: Secondary | ICD-10-CM | POA: Diagnosis not present

## 2020-05-11 DIAGNOSIS — K6389 Other specified diseases of intestine: Secondary | ICD-10-CM | POA: Insufficient documentation

## 2020-05-11 DIAGNOSIS — Z791 Long term (current) use of non-steroidal anti-inflammatories (NSAID): Secondary | ICD-10-CM | POA: Insufficient documentation

## 2020-05-11 HISTORY — PX: COLONOSCOPY WITH PROPOFOL: SHX5780

## 2020-05-11 LAB — GLUCOSE, CAPILLARY: Glucose-Capillary: 193 mg/dL — ABNORMAL HIGH (ref 70–99)

## 2020-05-11 SURGERY — COLONOSCOPY WITH PROPOFOL
Anesthesia: General

## 2020-05-11 MED ORDER — SODIUM CHLORIDE 0.9 % IV SOLN
INTRAVENOUS | Status: DC
  Administered 2020-05-11: 1000 mL via INTRAVENOUS

## 2020-05-11 MED ORDER — PROPOFOL 500 MG/50ML IV EMUL
INTRAVENOUS | Status: AC
  Filled 2020-05-11: qty 50

## 2020-05-11 MED ORDER — PROPOFOL 500 MG/50ML IV EMUL
INTRAVENOUS | Status: DC | PRN
  Administered 2020-05-11: 120 ug/kg/min via INTRAVENOUS

## 2020-05-11 NOTE — Anesthesia Postprocedure Evaluation (Signed)
Anesthesia Post Note  Patient: Carl Melton  Procedure(s) Performed: COLONOSCOPY WITH PROPOFOL (N/A )  Patient location during evaluation: Endoscopy Anesthesia Type: General Level of consciousness: awake and alert Pain management: pain level controlled Vital Signs Assessment: post-procedure vital signs reviewed and stable Respiratory status: spontaneous breathing, nonlabored ventilation, respiratory function stable and patient connected to nasal cannula oxygen Cardiovascular status: blood pressure returned to baseline and stable Postop Assessment: no apparent nausea or vomiting Anesthetic complications: no   No complications documented.   Last Vitals:  Vitals:   05/11/20 0850 05/11/20 0900  BP: 109/69 114/75  Pulse: 85 83  Resp: 16 (!) 22  Temp: 37.2 C   SpO2: 96% 97%    Last Pain:  Vitals:   05/11/20 0900  TempSrc:   PainSc: 0-No pain                 Martha Clan

## 2020-05-11 NOTE — Anesthesia Procedure Notes (Signed)
Performed by: Vaughan Sine Pre-anesthesia Checklist: Patient identified, Suction available, Emergency Drugs available, Patient being monitored and Timeout performed Patient Re-evaluated:Patient Re-evaluated prior to induction Oxygen Delivery Method: Supernova nasal CPAP Preoxygenation: Pre-oxygenation with 100% oxygen Induction Type: IV induction Placement Confirmation: positive ETCO2 and CO2 detector

## 2020-05-11 NOTE — Transfer of Care (Signed)
Immediate Anesthesia Transfer of Care Note  Patient: Carl Melton  Procedure(s) Performed: COLONOSCOPY WITH PROPOFOL (N/A )  Patient Location: PACU  Anesthesia Type:General  Level of Consciousness: awake and sedated  Airway & Oxygen Therapy: Patient Spontanous Breathing and Patient connected to face mask oxygen  Post-op Assessment: Report given to RN and Post -op Vital signs reviewed and stable  Post vital signs: Reviewed and stable  Last Vitals:  Vitals Value Taken Time  BP    Temp    Pulse    Resp    SpO2      Last Pain:  Vitals:   05/11/20 0725  TempSrc: Temporal  PainSc: 0-No pain         Complications: No complications documented.

## 2020-05-11 NOTE — Interval H&P Note (Signed)
History and Physical Interval Note:  05/11/2020 8:07 AM  Carl Melton  has presented today for surgery, with the diagnosis of Hx of adentomous polyps of colon.  The various methods of treatment have been discussed with the patient and family. After consideration of risks, benefits and other options for treatment, the patient has consented to  Procedure(s): COLONOSCOPY WITH PROPOFOL (N/A) as a surgical intervention.  The patient's history has been reviewed, patient examined, no change in status, stable for surgery.  I have reviewed the patient's chart and labs.  Questions were answered to the patient's satisfaction.     [name]  Ok to proceed with colonoscopy.

## 2020-05-11 NOTE — H&P (Signed)
Outpatient short stay form Pre-procedure 05/11/2020 8:02 AM Raylene Miyamoto MD, MPH  Primary Physician: Dr. Ouida Sills  Reason for visit:  History of polyps  History of present illness:   64 y/o gentleman with morbid obesity here for surveillance colonoscopy for polyps noted in 2011. Had normal colonoscopy in 2014 but colonoscopy in 2019 with poor prep. No family history of GI malignancies. No blood thinners. History of appendectomy.    Current Facility-Administered Medications:  .  0.9 %  sodium chloride infusion, , Intravenous, Continuous, Artasia Thang, Hilton Cork, MD, Last Rate: 20 mL/hr at 05/11/20 0744, 1,000 mL at 05/11/20 0744  Facility-Administered Medications Prior to Admission  Medication Dose Route Frequency Provider Last Rate Last Admin  . acetaminophen (TYLENOL) tablet 500 mg  500 mg Oral Once Ratcliffe, Heather R, PA-C       Medications Prior to Admission  Medication Sig Dispense Refill Last Dose  . atorvastatin (LIPITOR) 80 MG tablet Take 80 mg by mouth daily.   Past Week at Unknown time  . Dulaglutide (TRULICITY [place]) Inject into the skin.   Past Week at Unknown time  . empagliflozin (JARDIANCE) 25 MG TABS tablet Take 25 mg by mouth daily.   Past Week at Unknown time  . felodipine (PLENDIL) 5 MG 24 hr tablet 5 mg daily.   4 05/10/2020 at Unknown time  . levothyroxine (SYNTHROID, LEVOTHROID) 88 MCG tablet Take 88 mcg by mouth daily.  5 Past Week at Unknown time  . lisinopril (PRINIVIL,ZESTRIL) 20 MG tablet Take 20 mg by mouth daily.  5 05/10/2020 at Unknown time  . lithium carbonate (ESKALITH) 450 MG CR tablet 1 and 1/2 tablets in the morning and 2 tablets at night 315 tablet 1 05/10/2020 at Unknown time  . loratadine (CLARITIN) 10 MG tablet Take by mouth.   Past Week at Unknown time  . LORazepam (ATIVAN) 0.5 MG tablet Take 3 tablets (1.5 mg total) by mouth at bedtime. 270 tablet 1 Past Week at Unknown time  . meloxicam (MOBIC) 15 MG tablet TAKE 1 TABLET BY MOUTH EVERY DAY 30  tablet 3 Past Week at Unknown time  . metFORMIN (GLUCOPHAGE-XR) 500 MG 24 hr tablet Take 1,000 mg by mouth daily.  5 Past Week at Unknown time  . naproxen sodium (ALEVE) 220 MG tablet Take 220 mg by mouth.   05/10/2020 at Unknown time  . pioglitazone (ACTOS) 30 MG tablet Take 30 mg by mouth daily.   Past Week at Unknown time  . risperiDONE (RISPERDAL) 1 MG tablet Take 1 tablet (1 mg total) by mouth at bedtime. 90 tablet 1 Past Week at Unknown time  . rosuvastatin (CRESTOR) 20 MG tablet Take 20 mg by mouth daily.   Past Week at Unknown time     Allergies  Allergen Reactions  . Erythromycin Ethylsuccinate Rash  . Simvastatin Other (See Comments) and Rash     Past Medical History:  Diagnosis Date  . Bipolar 1 disorder ([place])   . Depression   . Diabetes mellitus without complication ([place])    type 2  . Dysrhythmia   . Hyperlipidemia   . Hypertension   . Hypothyroidism   . LVH (left ventricular hypertrophy)   . Morbid obesity ([place])   . Sleep apnea    on CPAP  . Steatohepatitis     Review of systems:  Otherwise negative.    Physical Exam  Gen: Alert, oriented. Appears stated age.  HEENT: [place]/AT. PERRLA. Lungs: no respiratory distress CV: RRR Abd: soft, benign, no  masses. Ext: No edema.     Planned procedures: Proceed with colonoscopy. The patient understands the nature of the planned procedure, indications, risks, alternatives and potential complications including but not limited to bleeding, infection, perforation, damage to internal organs and possible oversedation/side effects from anesthesia. The patient agrees and gives consent to proceed.  Please refer to procedure notes for findings, recommendations and patient disposition/instructions.     Raylene Miyamoto MD, MPH Gastroenterology 05/11/2020  8:02 AM

## 2020-05-11 NOTE — Anesthesia Preprocedure Evaluation (Signed)
Anesthesia Evaluation  Patient identified by MRN, date of birth, ID band Patient awake    Reviewed: Allergy & Precautions, NPO status , Patient's Chart, lab work & pertinent test results  History of Anesthesia Complications Negative for: history of anesthetic complications  Airway Mallampati: III  TM Distance: >3 FB    Comment: Large neck Dental  (+) Dental Advidsory Given, Caps, Teeth Intact   Pulmonary neg shortness of breath, sleep apnea and Continuous Positive Airway Pressure Ventilation , neg COPD, neg recent URI, former smoker,    Pulmonary exam normal        Cardiovascular hypertension, (-) angina(-) Past MI and (-) Cardiac Stents Normal cardiovascular exam+ dysrhythmias (-) Valvular Problems/Murmurs     Neuro/Psych PSYCHIATRIC DISORDERS Depression Bipolar Disorder negative neurological ROS     GI/Hepatic negative GI ROS,   Endo/Other  diabetesHypothyroidism Morbid obesity  Renal/GU negative Renal ROS  negative genitourinary   Musculoskeletal negative musculoskeletal ROS (+)   Abdominal Normal abdominal exam  (+)   Peds negative pediatric ROS (+)  Hematology negative hematology ROS (+)   Anesthesia Other Findings   Reproductive/Obstetrics negative OB ROS                             Anesthesia Physical  Anesthesia Plan  ASA: III  Anesthesia Plan: General   Post-op Pain Management:    Induction: Intravenous  PONV Risk Score and Plan: TIVA and Propofol infusion  Airway Management Planned: Nasal Cannula and Natural Airway  Additional Equipment:   Intra-op Plan:   Post-operative Plan:   Informed Consent: I have reviewed the patients History and Physical, chart, labs and discussed the procedure including the risks, benefits and alternatives for the proposed anesthesia with the patient or authorized representative who has indicated his/her understanding and acceptance.      Dental advisory given  Plan Discussed with: CRNA and Surgeon  Anesthesia Plan Comments:         Anesthesia Quick Evaluation

## 2020-05-11 NOTE — Op Note (Signed)
[facility] Gastroenterology Patient Name: Carl Melton Procedure Date: 05/11/2020 8:09 AM MRN: [id number] Account #: [id number] Date of Birth: Oct 12, 1955 Admit Type: Outpatient Age: 64 Room: [facility] ENDO ROOM 3 Gender: Male Note Status: Finalized Procedure:             Colonoscopy Indications:           Personal history of colonic polyps Providers:             Andrey Farmer MD, MD Referring MD:          Ocie Cornfield. Ouida Sills MD, MD (Referring MD) Medicines:             Monitored Anesthesia Care Complications:         No immediate complications. Procedure:             Pre-Anesthesia Assessment:                        - Prior to the procedure, a History and Physical was                         performed, and patient medications and allergies were                         reviewed. The patient is competent. The risks and                         benefits of the procedure and the sedation options and                         risks were discussed with the patient. All questions                         were answered and informed consent was obtained.                         Patient identification and proposed procedure were                         verified by the physician, the nurse, the anesthetist                         and the technician in the endoscopy suite. Mental                         Status Examination: alert and oriented. Airway                         Examination: normal oropharyngeal airway and neck                         mobility. Respiratory Examination: clear to                         auscultation. CV Examination: normal. Prophylactic                         Antibiotics: The patient does not require prophylactic  antibiotics. Prior Anticoagulants: The patient has                         taken no previous anticoagulant or antiplatelet                         agents. ASA Grade Assessment: III - A patient with                          severe systemic disease. After reviewing the risks and                         benefits, the patient was deemed in satisfactory                         condition to undergo the procedure. The anesthesia                         plan was to use monitored anesthesia care (MAC).                         Immediately prior to administration of medications,                         the patient was re-assessed for adequacy to receive                         sedatives. The heart rate, respiratory rate, oxygen                         saturations, blood pressure, adequacy of pulmonary                         ventilation, and response to care were monitored                         throughout the procedure. The physical status of the                         patient was re-assessed after the procedure.                        After obtaining informed consent, the colonoscope was                         passed under direct vision. Throughout the procedure,                         the patient's blood pressure, pulse, and oxygen                         saturations were monitored continuously. The                         Colonoscope was introduced through the anus with the                         intention of advancing to the cecum. The scope was  advanced to the ascending colon before the procedure                         was aborted. Medications were given. The colonoscopy                         was technically difficult and complex due to a                         redundant colon and significant looping. The patient                         tolerated the procedure well. The quality of the bowel                         preparation was fair. Findings:      The perianal and digital rectal examinations were normal.      The exam was otherwise without abnormality on direct and retroflexion       views. Impression:            - Stool in the entire examined colon.                        -  The examination was otherwise normal on direct and                         retroflexion views.                        - No specimens collected. Recommendation:        - Discharge patient to home.                        - Diet per speech therapy.                        - Continue present medications.                        - Perform a virtual colonoscopy at appointment to be                         scheduled. Procedure Code(s):     --- Professional ---                        [phone], 23, Colonoscopy, flexible; diagnostic,                         including collection of specimen(s) by brushing or                         washing, when performed (separate procedure) Diagnosis Code(s):     --- Professional ---                        Z86.010, Personal history of colonic polyps CPT copyright 2019 American Medical Association. All rights reserved. The codes documented in this report are preliminary and upon coder review may  be revised to meet current compliance requirements. Andrey Farmer, MD Andrey Farmer MD, MD  05/11/2020 8:52:15 AM Number of Addenda: 0 Note Initiated On: 05/11/2020 8:09 AM Total Procedure Duration: 0 hours 27 minutes 41 seconds  Estimated Blood Loss:  Estimated blood loss: none.      [facility]

## 2020-05-12 ENCOUNTER — Encounter: Payer: Self-pay | Admitting: Gastroenterology

## 2020-05-13 ENCOUNTER — Other Ambulatory Visit: Payer: Self-pay | Admitting: Gastroenterology

## 2020-05-13 DIAGNOSIS — Z1211 Encounter for screening for malignant neoplasm of colon: Secondary | ICD-10-CM

## 2020-07-07 ENCOUNTER — Ambulatory Visit (INDEPENDENT_AMBULATORY_CARE_PROVIDER_SITE_OTHER): Payer: BC Managed Care – PPO | Admitting: Psychiatry

## 2020-07-07 ENCOUNTER — Encounter: Payer: Self-pay | Admitting: Psychiatry

## 2020-07-07 ENCOUNTER — Other Ambulatory Visit: Payer: Self-pay

## 2020-07-07 DIAGNOSIS — F5105 Insomnia due to other mental disorder: Secondary | ICD-10-CM

## 2020-07-07 DIAGNOSIS — F319 Bipolar disorder, unspecified: Secondary | ICD-10-CM

## 2020-07-07 MED ORDER — RISPERIDONE 1 MG PO TABS: 1.0000 mg | ORAL_TABLET | Freq: Every day | ORAL | 1 refills | Status: DC

## 2020-07-07 MED ORDER — LORAZEPAM 0.5 MG PO TABS
1.5000 mg | ORAL_TABLET | Freq: Every day | ORAL | 1 refills | Status: DC
Start: 2020-07-07 — End: 2020-07-28

## 2020-07-07 MED ORDER — LITHIUM CARBONATE ER 450 MG PO TBCR: EXTENDED_RELEASE_TABLET | ORAL | 1 refills | Status: DC

## 2020-07-07 NOTE — Progress Notes (Signed)
Carl Melton [id number] 1956/03/17 64 y.o.  Subjective:   Patient ID:  Carl Melton is a 64 y.o. (DOB [birth year]) male.  Chief Complaint:  Chief Complaint  Patient presents with  . Follow-up    Mood and meds    HPI Carl Melton presents to the office today for follow-up of bipolar disorder and sleep.    Last seen December 2020.  No meds were changed.  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    01/06/2020 appointment with the following noted: Continues lithium 450 mg 3 and 1/2 tablets daily, lorazepam 1.5 mg HS, Risperdal 1 mg HS. Vaccinated. OK overall.  Doing job well with good reports but getting harder to manage multiple pieces.  Want to work 2 more years after this one.  Has good assistants.  Has good rapport with coworkers.  Mood been OK.  Only thing he notices is tremor with fine motor things at times.  OK typing.  Done well with work.  Has been able to keep going forward.  Would like to retire sooner than 66 but not sure.  Would stay busy with retirement.   Sleep is pretty good and naps after work.  OK during the day.  Has to get up anyway bc nocturia and now having trouble going back too sleep for the last 3-4 mos. 2 nights out of the week. Most of the time feeling good.  No significant mood swings No unusual fear.  Sleep is better witn increase in lorazepam without awakening.  Active.  Patient reports stable mood and denies depressed.  Patient denies difficulty with sleep initiation. Denies appetite disturbance.  Patient reports that energy and motivation have been good.  Patient denies any difficulty with concentration.  Patient denies any suicidal ideation.  Physical health stable. Plan; check lithium level. No med changes  07/07/2020 appointment with the following noted:  Still undecided about retirement but probably a couple of years more.  Job is going OK. Occ a little agitated but not manic and no problems  with it.  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  More of it in the last year, bc of a couple of key people leaving but it's better now. No mood swings.   Not sleeping great.  DM is a problem causing nocturia 4-5 times nightly. No sig SE lithium. Patient reports stable mood and denies depressed or irritable moods.  Patient denies any recent difficulty with anxiety. Denies appetite disturbance.  Patient reports that energy and motivation have been good.  Patient denies any difficulty with concentration.  Patient denies any suicidal ideation.   Past Psychiatric Medication Trials: Risperidone lithium, amiloride, lorazepam, Geodon sedation, haloperidol, Prolixin, Trileptal, gabapentin, sertraline, Wellbutrin  He has been under our care since December 1994.  He has had psychiatric hospitalization once.  He has been stable on lithium and risperidone since about 2007  Review of Systems:  Review of Systems  HENT: Positive for hearing loss.   Cardiovascular: Negative for chest pain.  Genitourinary: Positive for frequency.  Neurological: Negative for tremors and weakness.  Psychiatric/Behavioral: Positive for sleep disturbance. Negative for agitation, behavioral problems, confusion, decreased concentration, dysphoric mood, hallucinations, self-injury and suicidal ideas. The patient is not nervous/anxious and is not hyperactive.     Medications: I have reviewed the patient's current medications.  Current Outpatient Medications  Medication Sig Dispense Refill  . atorvastatin (LIPITOR) 80 MG tablet Take 80 mg by  mouth daily.    . Dulaglutide (TRULICITY [place]) Inject into the skin.    [name] empagliflozin (JARDIANCE) 25 MG TABS tablet Take 25 mg by mouth daily.    . felodipine (PLENDIL) 5 MG 24 hr tablet 5 mg daily.   4  . levothyroxine (SYNTHROID, LEVOTHROID) 88 MCG tablet Take 88 mcg by mouth daily.  5  . lisinopril (PRINIVIL,ZESTRIL) 20 MG tablet Take 20 mg by mouth daily.  5  . loratadine  (CLARITIN) 10 MG tablet Take by mouth.    . meloxicam (MOBIC) 15 MG tablet TAKE 1 TABLET BY MOUTH EVERY DAY 30 tablet 3  . metFORMIN (GLUCOPHAGE-XR) 500 MG 24 hr tablet Take 1,000 mg by mouth daily.  5  . naproxen sodium (ALEVE) 220 MG tablet Take 220 mg by mouth.    . pioglitazone (ACTOS) 30 MG tablet Take 30 mg by mouth daily.    . rosuvastatin (CRESTOR) 20 MG tablet Take 20 mg by mouth daily.    [name] lithium carbonate (ESKALITH) 450 MG CR tablet 1 and 1/2 tablets in the morning and 2 tablets at night 315 tablet 1  . LORazepam (ATIVAN) 0.5 MG tablet Take 3 tablets (1.5 mg total) by mouth at bedtime. 270 tablet 1  . risperiDONE (RISPERDAL) 1 MG tablet Take 1 tablet (1 mg total) by mouth at bedtime. 90 tablet 1   Current Facility-Administered Medications  Medication Dose Route Frequency Provider Last Rate Last Admin  . acetaminophen (TYLENOL) tablet 500 mg  500 mg Oral Once Melton, Carl R, PA-C        Medication Side Effects: None  Allergies:  Allergies  Allergen Reactions  . Erythromycin Ethylsuccinate Rash  . Simvastatin Other (See Comments) and Rash    Past Medical History:  Diagnosis Date  . Bipolar 1 disorder ([place])   . Depression   . Diabetes mellitus without complication ([place])    type 2  . Dysrhythmia   . Hyperlipidemia   . Hypertension   . Hypothyroidism   . LVH (left ventricular hypertrophy)   . Morbid obesity ([place])   . Sleep apnea    on CPAP  . Steatohepatitis     History reviewed. No pertinent family history.  Social History   Socioeconomic History  . Marital status: Married    Spouse name: Not on file  . Number of children: Not on file  . Years of education: Not on file  . Highest education level: Not on file  Occupational History  . Not on file  Tobacco Use  . Smoking status: Former Research scientist (life sciences)  . Smokeless tobacco: Never Used  . Tobacco comment: quit 25 years  Vaping Use  . Vaping Use: Never used  Substance and Sexual Activity  . Alcohol use: Yes     Alcohol/week: 4.0 standard drinks    Types: 4 Cans of beer per week  . Drug use: Never  . Sexual activity: Not on file  Other Topics Concern  . Not on file  Social History Narrative  . Not on file   Social Determinants of Health   Financial Resource Strain: Not on file  Food Insecurity: Not on file  Transportation Needs: Not on file  Physical Activity: Not on file  Stress: Not on file  Social Connections: Not on file  Intimate Partner Violence: Not on file    Past Medical History, Surgical history, Social history, and Family history were reviewed and updated as appropriate.   Please see review of systems for further details on the  patient's review from today.   Objective:   Physical Exam:  There were no vitals taken for this visit.  Physical Exam Constitutional:      General: He is not in acute distress.    Appearance: He is well-developed. He is obese.  Musculoskeletal:        General: No deformity.  Neurological:     Mental Status: He is alert and oriented to person, place, and time.     Motor: No tremor.     Coordination: Coordination normal.     Gait: Gait normal.  Psychiatric:        Attention and Perception: Attention and perception normal.        Mood and Affect: Mood is not anxious or depressed. Affect is not labile, blunt, angry or inappropriate.        Speech: Speech normal. Speech is not rapid and pressured.        Behavior: Behavior normal.        Thought Content: Thought content normal. Thought content is not paranoid. Thought content does not include homicidal or suicidal ideation. Thought content does not include homicidal or suicidal plan.        Cognition and Memory: Cognition normal.        Judgment: Judgment normal.     Comments: Insight intact. No auditory or visual hallucinations. No delusions.      Lab Review:     Component Value Date/Time   NA 135 01/06/2020 0724   K 4.8 01/06/2020 0724   CL 99 01/06/2020 0724   CO2 17 (L)  01/06/2020 0724   GLUCOSE 197 (H) 01/06/2020 0724   BUN 18 01/06/2020 0724   CREATININE 1.08 01/06/2020 0724   CALCIUM 9.7 01/06/2020 0724   GFRNONAA 73 01/06/2020 0724   GFRAA 84 01/06/2020 0724    No results found for: WBC, RBC, HGB, HCT, PLT, MCV, MCH, MCHC, RDW, LYMPHSABS, MONOABS, EOSABS, BASOSABS  Lithium Lvl  Date Value Ref Range Status  01/06/2020 1.1 0.5 - 1.2 mmol/L Final    Comment:                                                    Detection Limit = 0.1                                          <0.1 indicates None Detected                               **Please note reference interval change**      No results found for: PHENYTOIN, PHENOBARB, VALPROATE, CBMZ   Last BMP was January 31, 2018 and was within normal limits except for glucose 227, TSH was 4.64, and lithium level was 0.8 which is stable.  .res Assessment: Plan:    Carl Melton was seen today for follow-up.  Diagnoses and all orders for this visit:  Bipolar I disorder ([place]) -     risperiDONE (RISPERDAL) 1 MG tablet; Take 1 tablet (1 mg total) by mouth at bedtime. -     lithium carbonate (ESKALITH) 450 MG CR tablet; 1 and 1/2 tablets in the morning and 2 tablets at night  Insomnia  due to mental condition -     LORazepam (ATIVAN) 0.5 MG tablet; Take 3 tablets (1.5 mg total) by mouth at bedtime.   Long term benefit lithium and risperidone and is stable with past history of psychotic features if not on antipsychotic.  We discussed the short-term risks associated with benzodiazepines including sedation and increased fall risk among others.  Discussed long-term side effect risk including dependence, potential withdrawal symptoms, and the potential eventual dose-related risk of dementia.  But recent studies from 2020 dispute this association between benzodiazepines and dementia risk. Newer studies in 2020 do not support an association with dementia.  Insomnia relieved with increase lorazepam to 1.5 mg HS.[name]  Need to treat  the insomnia because it is a risk factor for mania.  "If my sleep is right I don't have any problems"  Previous Labs good with normal lithium, BMP and TSH in the summer. 01/07/20 Lithium level is stable at 1.1 on a relatively high dose of lithium.  Serum BMP is normal.  No change indicated except for nocutria switch lithium to 900 mg AM and  1 and 1/2 of 450 mg tablets at night.  Counseled patient regarding potential benefits, risks, and side effects of lithium to include potential risk of lithium affecting thyroid and renal function.  Discussed need for periodic lab monitoring to determine drug level and to assess for potential adverse effects.  Counseled patient regarding signs and symptoms of lithium toxicity and advised that they notify office immediately or seek urgent medical attention if experiencing these signs and symptoms.  Patient advised to contact office with any questions or concerns.  No med changes other than timing  FU 6 mos  Lynder Parents, MD, DFAPA    Please see After Visit Summary for patient specific instructions.  No future appointments.  No orders of the defined types were placed in this encounter.     -------------------------------

## 2020-07-27 ENCOUNTER — Other Ambulatory Visit: Payer: Self-pay | Admitting: Psychiatry

## 2020-07-27 DIAGNOSIS — F5105 Insomnia due to other mental disorder: Secondary | ICD-10-CM

## 2020-10-12 ENCOUNTER — Inpatient Hospital Stay: Admission: RE | Admit: 2020-10-12 | Payer: BC Managed Care – PPO | Source: Ambulatory Visit

## 2020-10-21 ENCOUNTER — Other Ambulatory Visit: Payer: Self-pay | Admitting: Psychiatry

## 2020-10-21 DIAGNOSIS — F319 Bipolar disorder, unspecified: Secondary | ICD-10-CM

## 2020-11-02 ENCOUNTER — Ambulatory Visit: Payer: BC Managed Care – PPO | Admitting: Podiatry

## 2020-12-30 ENCOUNTER — Encounter: Payer: Self-pay | Admitting: Psychiatry

## 2020-12-30 ENCOUNTER — Other Ambulatory Visit: Payer: Self-pay

## 2020-12-30 ENCOUNTER — Ambulatory Visit: Payer: BC Managed Care – PPO | Admitting: Psychiatry

## 2020-12-30 DIAGNOSIS — G20 Parkinson's disease: Secondary | ICD-10-CM | POA: Diagnosis not present

## 2020-12-30 DIAGNOSIS — F5105 Insomnia due to other mental disorder: Secondary | ICD-10-CM | POA: Diagnosis not present

## 2020-12-30 DIAGNOSIS — F319 Bipolar disorder, unspecified: Secondary | ICD-10-CM | POA: Diagnosis not present

## 2020-12-30 NOTE — Progress Notes (Signed)
Carl Melton Carl Melton [id number] [birth year] 65 y.o.  Subjective:   Patient ID:  Carl Melton is a 65 y.o. (DOB September 27, 1955) male.  Chief Complaint:  Chief Complaint  Patient presents with   Follow-up   Bipolar I disorder ([place])   Medication Problem   Sleeping Problem    HPI Carl Melton presents to the office today for follow-up of bipolar disorder and sleep.    seen December 2020.  No meds were changed.  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    01/06/2020 appointment with the following noted: Continues lithium 450 mg 3 and 1/2 tablets daily, lorazepam 1.5 mg HS, Risperdal 1 mg HS. Vaccinated. OK overall.  Doing job well with good reports but getting harder to manage multiple pieces.  Want to work 2 more years after this one.  Has good assistants.  Has good rapport with coworkers.  Mood been OK.  Only thing he notices is tremor with fine motor things at times.  OK typing.  Done well with work.  Has been able to keep going forward.  Would like to retire sooner than 66 but not sure.  Would stay busy with retirement.   Sleep is pretty good and naps after work.  OK during the day.  Has to get up anyway bc nocturia and now having trouble going back too sleep for the last 3-4 mos. 2 nights out of the week. Most of the time feeling good.  No significant mood swings No unusual fear.  Sleep is better witn increase in lorazepam without awakening.  Active.  Patient reports stable mood and denies depressed.  Patient denies difficulty with sleep initiation. Denies appetite disturbance.  Patient reports that energy and motivation have been good.  Patient denies any difficulty with concentration.  Patient denies any suicidal ideation.  Physical health stable. Plan; check lithium level. No med changes  07/07/2020 appointment with the following noted:  Still undecided about retirement but probably a couple of years more.  Job is going OK. Occ a  little agitated but not manic and no problems with it.  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  More of it in the last year, bc of a couple of key people leaving but it's better now. No mood swings.   Not sleeping great.  DM is a problem causing nocturia 4-5 times nightly. No sig SE lithium. Patient reports stable mood and denies depressed or irritable moods.  Patient denies any recent difficulty with anxiety. Denies appetite disturbance.  Patient reports that energy and motivation have been good.  Patient denies any difficulty with concentration.  Patient denies any suicidal ideation. Plan: for nocutria switch lithium to 900 mg AM and  1 and 1/2 of 450 mg tablets at night.  12/30/2020 appointment with the following noted: Fine with meds but some trouble sleeping but thinks it's stress.  [place] and [place] up once to urinate.  Home up 2-3 times nightly. Wonders if he has beginning PD with tremor and slower and stiffer. Worries too much.  Patient reports stable mood and denies depressed or irritable moods.  Patient denies any recent difficulty with anxiety.  Denies appetite disturbance.  Patient reports that energy and motivation have been good.  Patient denies any difficulty with concentration.  Patient denies any suicidal ideation.   Past Psychiatric Medication Trials: Risperidone 1,  lithium, amiloride, lorazepam, Geodon sedation, haloperidol, Prolixin, Trileptal, gabapentin,  sertraline, Wellbutrin  He has been under our care since December 1994.  He has had psychiatric hospitalization once.  He has been stable on lithium and risperidone since about 2007  Review of Systems:  Review of Systems  HENT:  Positive for hearing loss.   Cardiovascular:  Negative for chest pain.  Genitourinary:  Positive for frequency.  Neurological:  Positive for tremors. Negative for weakness.       Balance issues  Psychiatric/Behavioral:  Positive for sleep disturbance. Negative for  agitation, behavioral problems, confusion, decreased concentration, dysphoric mood, hallucinations, self-injury and suicidal ideas. The patient is not nervous/anxious and is not hyperactive.    Medications: I have reviewed the patient's current medications.  Current Outpatient Medications  Medication Sig Dispense Refill   Dulaglutide (TRULICITY [place]) Inject into the skin.     empagliflozin (JARDIANCE) 25 MG TABS tablet Take 25 mg by mouth daily.     felodipine (PLENDIL) 5 MG 24 hr tablet 5 mg daily.   4   levothyroxine (SYNTHROID, LEVOTHROID) 88 MCG tablet Take 88 mcg by mouth daily.  5   lisinopril (PRINIVIL,ZESTRIL) 20 MG tablet Take 20 mg by mouth daily.  5   lithium carbonate (ESKALITH) 450 MG CR tablet 1 and 1/2 tablets in the morning and 2 tablets at night 315 tablet 1   loratadine (CLARITIN) 10 MG tablet Take by mouth.     LORazepam (ATIVAN) 0.5 MG tablet TAKE 3 TABLETS (1.5 MG TOTAL) BY MOUTH AT BEDTIME. 270 tablet 1   meloxicam (MOBIC) 15 MG tablet TAKE 1 TABLET BY MOUTH EVERY DAY 30 tablet 3   metFORMIN (GLUCOPHAGE-XR) 500 MG 24 hr tablet Take 1,000 mg by mouth daily.  5   naproxen sodium (ALEVE) 220 MG tablet Take 220 mg by mouth.     pioglitazone (ACTOS) 30 MG tablet Take 30 mg by mouth daily.     risperiDONE (RISPERDAL) 1 MG tablet TAKE 1 TABLET BY MOUTH AT BEDTIME. 90 tablet 1   rosuvastatin (CRESTOR) 20 MG tablet Take 20 mg by mouth daily.     atorvastatin (LIPITOR) 80 MG tablet Take 80 mg by mouth daily.     Current Facility-Administered Medications  Medication Dose Route Frequency Provider Last Rate Last Admin   acetaminophen (TYLENOL) tablet 500 mg  500 mg Oral Once Ratcliffe, Heather R, PA-C        Medication Side Effects: None  Allergies:  Allergies  Allergen Reactions   Erythromycin Ethylsuccinate Rash   Simvastatin Other (See Comments) and Rash    Past Medical History:  Diagnosis Date   Bipolar 1 disorder ([place])    Depression    Diabetes mellitus without  complication ([place])    type 2   Dysrhythmia    Hyperlipidemia    Hypertension    Hypothyroidism    LVH (left ventricular hypertrophy)    Morbid obesity ([facility])    Sleep apnea    on CPAP   Steatohepatitis     History reviewed. No pertinent family history.  Social History   Socioeconomic History   Marital status: Married    Spouse name: Not on file   Number of children: Not on file   Years of education: Not on file   Highest education level: Not on file  Occupational History   Not on file  Tobacco Use   Smoking status: Former    Pack years: 0.00   Smokeless tobacco: Never   Tobacco comments:    quit 25 years  Vaping Use  Vaping Use: Never used  Substance and Sexual Activity   Alcohol use: Yes    Alcohol/week: 4.0 standard drinks    Types: 4 Cans of beer per week   Drug use: Never   Sexual activity: Not on file  Other Topics Concern   Not on file  Social History Narrative   Not on file   Social Determinants of Health   Financial Resource Strain: Not on file  Food Insecurity: Not on file  Transportation Needs: Not on file  Physical Activity: Not on file  Stress: Not on file  Social Connections: Not on file  Intimate Partner Violence: Not on file    Past Medical History, Surgical history, Social history, and Family history were reviewed and updated as appropriate.   Please see review of systems for further details on the patient's review from today.   Objective:   Physical Exam:  There were no vitals taken for this visit.  Physical Exam Constitutional:      General: He is not in acute distress.    Appearance: He is obese.  Musculoskeletal:        General: No deformity.  Neurological:     Mental Status: He is alert and oriented to person, place, and time.     Motor: Abnormal muscle tone present.     Coordination: Coordination normal.     Gait: Gait abnormal.     Comments: A little smaller step length in gait. Mild-mod increase motor tone No sig  tremor noticed  Psychiatric:        Attention and Perception: Attention and perception normal.        Mood and Affect: Mood is anxious. Mood is not depressed. Affect is not labile, blunt, angry or inappropriate.        Speech: Speech normal. Speech is not rapid and pressured.        Behavior: Behavior normal.        Thought Content: Thought content normal. Thought content is not paranoid. Thought content does not include homicidal or suicidal ideation. Thought content does not include homicidal or suicidal plan.        Cognition and Memory: Cognition normal.        Judgment: Judgment normal.     Comments: Insight intact. No auditory or visual hallucinations. No delusions.     Lab Review:     Component Value Date/Time   NA 135 01/06/2020 0724   K 4.8 01/06/2020 0724   CL 99 01/06/2020 0724   CO2 17 (L) 01/06/2020 0724   GLUCOSE 197 (H) 01/06/2020 0724   BUN 18 01/06/2020 0724   CREATININE 1.08 01/06/2020 0724   CALCIUM 9.7 01/06/2020 0724   GFRNONAA 73 01/06/2020 0724   GFRAA 84 01/06/2020 0724    No results found for: WBC, RBC, HGB, HCT, PLT, MCV, MCH, MCHC, RDW, LYMPHSABS, MONOABS, EOSABS, BASOSABS  Lithium Lvl  Date Value Ref Range Status  01/06/2020 1.1 0.5 - 1.2 mmol/L Final    Comment:                                                    Detection Limit = 0.1                                          <  0.1 indicates None Detected                               **Please note reference interval change**      No results found for: PHENYTOIN, PHENOBARB, VALPROATE, CBMZ   Last BMP was January 31, 2018 and was within normal limits except for glucose 227, TSH was 4.64, and lithium level was 0.8 which is stable.  .res Assessment: Plan:    Labron was seen today for follow-up, bipolar i disorder ([facility]), medication problem and sleeping problem.  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 We discussed the following: Long term benefit lithium and risperidone and is stable with past history of psychotic features if not on antipsychotic.  We discussed the short-term risks associated with benzodiazepines including sedation and increased fall risk among others.  Discussed long-term side effect risk including dependence, potential withdrawal symptoms, and the potential eventual dose-related risk of dementia.  But recent studies from 2020 dispute this association between benzodiazepines and dementia risk. Newer studies in 2020 do not support an association with dementia.  Insomnia relieved with increase lorazepam to 1.5 mg HS.[name]  Need to treat the insomnia because it is a risk factor for mania.  "If my sleep is right I don't have any problems"  Previous Labs good with normal lithium, BMP and TSH in the summer. 01/07/20 Lithium level is stable at 1.1 on a relatively high dose of lithium.  Serum BMP is normal.  No change indicated except for nocutria switch lithium to 900 mg AM and  1 and 1/2 of 450 mg tablets at night.  Counseled patient regarding potential benefits, risks, and side effects of lithium to include potential risk of lithium affecting thyroid and renal function.  Discussed need for periodic lab monitoring to determine drug level and to assess for potential adverse effects.  Counseled patient regarding signs and symptoms of lithium toxicity and advised that they notify office immediately or seek urgent medical attention if experiencing these signs and symptoms.  Patient advised to contact office with any questions or concerns.  Disc diagnosing PD vs EPS from risperidone.  Disc risk of lowering the dose.  Reduce risperidone to 1/2 of 1 mg HS to see if Parkinsonism is better Sees PCP in a month. Call if sleep is worse.  Anxiety is worse or paranoia.     FU 2 mos  Lynder Parents, MD, DFAPA    Please see After Visit Summary for patient specific instructions.  Future Appointments  Date Time Provider [place]  03/29/2021  1:30 PM Cottle, Billey Co., MD [facility] None    No orders of the defined types were placed in this encounter.     -------------------------------

## 2020-12-31 ENCOUNTER — Other Ambulatory Visit: Payer: Self-pay | Admitting: Psychiatry

## 2020-12-31 DIAGNOSIS — F319 Bipolar disorder, unspecified: Secondary | ICD-10-CM

## 2021-01-19 ENCOUNTER — Other Ambulatory Visit: Payer: Self-pay | Admitting: Psychiatry

## 2021-01-19 DIAGNOSIS — F5105 Insomnia due to other mental disorder: Secondary | ICD-10-CM

## 2021-02-15 ENCOUNTER — Telehealth: Payer: Self-pay | Admitting: Psychiatry

## 2021-02-15 NOTE — Telephone Encounter (Signed)
error 

## 2021-03-29 ENCOUNTER — Ambulatory Visit: Payer: BC Managed Care – PPO | Admitting: Psychiatry

## 2021-03-29 ENCOUNTER — Other Ambulatory Visit: Payer: Self-pay

## 2021-03-29 ENCOUNTER — Encounter: Payer: Self-pay | Admitting: Psychiatry

## 2021-03-29 DIAGNOSIS — F319 Bipolar disorder, unspecified: Secondary | ICD-10-CM | POA: Diagnosis not present

## 2021-03-29 DIAGNOSIS — E538 Deficiency of other specified B group vitamins: Secondary | ICD-10-CM | POA: Diagnosis not present

## 2021-03-29 DIAGNOSIS — F5105 Insomnia due to other mental disorder: Secondary | ICD-10-CM | POA: Diagnosis not present

## 2021-03-29 DIAGNOSIS — R202 Paresthesia of skin: Secondary | ICD-10-CM

## 2021-03-29 MED ORDER — LITHIUM CARBONATE ER 450 MG PO TBCR: EXTENDED_RELEASE_TABLET | ORAL | 1 refills | Status: DC

## 2021-03-29 MED ORDER — RISPERIDONE 1 MG PO TABS: 1.0000 mg | ORAL_TABLET | Freq: Every day | ORAL | 1 refills | Status: DC

## 2021-03-29 NOTE — Progress Notes (Signed)
Carl Melton [id number] 17-Oct-1955 65 y.o.  Subjective:   Patient ID:  Carl Melton is a 66 y.o. (DOB 1956-01-29) male.  Chief Complaint:  Chief Complaint  Patient presents with   Follow-up   Bipolar I disorder [facility])   Stress    Health issues    HPI Carl Melton presents to the office today for follow-up of bipolar disorder and sleep.    seen December 2020.  No meds were changed.  Carl Melton, acct at [facility] and her Carl Melton,  Covid 2020  but recovered well.    01/06/2020 appointment with the following noted: Continues lithium 450 mg 3 and 1/2 tablets daily, lorazepam 1.5 mg HS, Risperdal 1 mg HS. Vaccinated. OK overall.  Doing job well with good reports but getting harder to manage multiple pieces.  Want to work 2 more years after this one.  Has good assistants.  Has good rapport with coworkers.  Mood been OK.  Only thing he notices is tremor with fine motor things at times.  OK typing.  Done well with work.  Has been able to keep going forward.  Would like to retire sooner than 66 but not sure.  Would stay busy with retirement.   Sleep is pretty good and naps after work.  OK during the day.  Has to get up anyway bc nocturia and now having trouble going back too sleep for the last 3-4 mos. 2 nights out of the week. Most of the time feeling good.  No significant mood swings No unusual fear.  Sleep is better witn increase in lorazepam without awakening.  Active.  Patient reports stable mood and denies depressed.  Patient denies difficulty with sleep initiation. Denies appetite disturbance.  Patient reports that energy and motivation have been good.  Patient denies any difficulty with concentration.  Patient denies any suicidal ideation.  Physical health stable. Plan; check lithium level. No med changes  07/07/2020 appointment with the following noted:  Still undecided about retirement but probably a couple of years more.  Job is going OK. Occ a little  agitated but not manic and no problems with it.  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  More of it in the last year, bc of a couple of key people leaving but it's better now. No mood swings.   Not sleeping great.  DM is a problem causing nocturia 4-5 times nightly. No sig SE lithium. Patient reports stable mood and denies depressed or irritable moods.  Patient denies any recent difficulty with anxiety. Denies appetite disturbance.  Patient reports that energy and motivation have been good.  Patient denies any difficulty with concentration.  Patient denies any suicidal ideation. Plan: for nocutria switch lithium to 900 mg AM and  1 and 1/2 of 450 mg tablets at night.  12/30/2020 appointment with the following noted: Fine with meds but some trouble sleeping but thinks it's stress.  [place] the [place] and only up once to urinate.  Home up 2-3 times nightly. Wonders if he has beginning PD with tremor and slower and stiffer. Worries too much.  Patient reports stable mood and denies depressed or irritable moods.  Patient denies any recent difficulty with anxiety.  Denies appetite disturbance.  Patient reports that energy and motivation have been good.  Patient denies any difficulty with concentration.  Patient denies any suicidal ideation. Plan:  No change indicated except for nocutria switch lithium to 900 mg AM and  1 and  1/2 of 450 mg tablets at night. Disc diagnosing PD vs EPS from risperidone.  Disc risk of lowering the dose.  Reduce risperidone to 1/2 of 1 mg HS to see if Parkinsonism is better  03/29/21 appt noted: For 2 weeks on lower risperidone OK and then depressed.  Increased to 1 mg HS and in 2 days he felt better. Tremor, slowness did not improve when reduced risperidone. Saw PCP thinks it's not PD Postional vertigo with head movment.  Facial paresthesias. Nocturia unchanged.  Asks about meds for it. No mood swings.   Patient reports stable mood and denies depressed  or irritable moods.  Patient denies any recent difficulty with anxiety.  Patient denies difficulty with sleep initiation or maintenance. Denies appetite disturbance.  Patient reports that energy and motivation have been good.  Patient denies any difficulty with concentration.  Patient denies any suicidal ideation. Tremor unchanged.  Past Psychiatric Medication Trials: Risperidone 1,  lithium, amiloride, lorazepam, Geodon sedation, haloperidol, Prolixin, Trileptal, gabapentin, sertraline, Wellbutrin  He has been under our care since December 1994.  He has had psychiatric hospitalization once.  He has been stable on lithium and risperidone since about 2007  Review of Systems:  Review of Systems  HENT:  Positive for hearing loss.   Cardiovascular:  Negative for chest pain.  Genitourinary:  Positive for frequency.  Neurological:  Positive for dizziness and tremors. Negative for weakness.       Balance issues Facial paresthesia  Psychiatric/Behavioral:  Positive for sleep disturbance. Negative for agitation, behavioral problems, confusion, decreased concentration, dysphoric mood, hallucinations, self-injury and suicidal ideas. The patient is not nervous/anxious and is not hyperactive.    Medications: I have reviewed the patient's current medications.  Current Outpatient Medications  Medication Sig Dispense Refill   atorvastatin (LIPITOR) 80 MG tablet Take 80 mg by mouth daily.     Dulaglutide (TRULICITY [place]) Inject into the skin.     empagliflozin (JARDIANCE) 25 MG TABS tablet Take 25 mg by mouth daily.     felodipine (PLENDIL) 5 MG 24 hr tablet 5 mg daily.   4   levothyroxine (SYNTHROID, LEVOTHROID) 88 MCG tablet Take 88 mcg by mouth daily.  5   lisinopril (PRINIVIL,ZESTRIL) 20 MG tablet Take 20 mg by mouth daily.  5   loratadine (CLARITIN) 10 MG tablet Take by mouth.     LORazepam (ATIVAN) 0.5 MG tablet TAKE 3 TABLETS (1.5 MG TOTAL) BY MOUTH AT BEDTIME. 270 tablet 1   meloxicam (MOBIC) 15  MG tablet TAKE 1 TABLET BY MOUTH EVERY DAY 30 tablet 3   metFORMIN (GLUCOPHAGE-XR) 500 MG 24 hr tablet Take 1,000 mg by mouth daily.  5   naproxen sodium (ALEVE) 220 MG tablet Take 220 mg by mouth.     pioglitazone (ACTOS) 30 MG tablet Take 30 mg by mouth daily.     rosuvastatin (CRESTOR) 20 MG tablet Take 20 mg by mouth daily.     lithium carbonate (ESKALITH) 450 MG CR tablet 1 AND 1/2 TABLETS IN THE MORNING AND 2 TABLETS AT NIGHT 315 tablet 1   risperiDONE (RISPERDAL) 1 MG tablet Take 1 tablet (1 mg total) by mouth at bedtime. 90 tablet 1   Current Facility-Administered Medications  Medication Dose Route Frequency Provider Last Rate Last Admin   acetaminophen (TYLENOL) tablet 500 mg  500 mg Oral Once Ratcliffe, Heather R, PA-C        Medication Side Effects: None  Allergies:  Allergies  Allergen Reactions   Erythromycin Ethylsuccinate  Rash   Simvastatin Other (See Comments) and Rash    Past Medical History:  Diagnosis Date   Bipolar 1 disorder ([place])    Depression    Diabetes mellitus without complication ([place])    type 2   Dysrhythmia    Hyperlipidemia    Hypertension    Hypothyroidism    LVH (left ventricular hypertrophy)    Morbid obesity ([facility])    Sleep apnea    on CPAP   Steatohepatitis     History reviewed. No pertinent family history.  Social History   Socioeconomic History   Marital status: Married    Spouse name: Not on file   Number of children: Not on file   Years of education: Not on file   Highest education level: Not on file  Occupational History   Not on file  Tobacco Use   Smoking status: Former   Smokeless tobacco: Never   Tobacco comments:    quit 25 years  Vaping Use   Vaping Use: Never used  Substance and Sexual Activity   Alcohol use: Yes    Alcohol/week: 4.0 standard drinks    Types: 4 Cans of beer per week   Drug use: Never   Sexual activity: Not on file  Other Topics Concern   Not on file  Social History Narrative   Not on file    Social Determinants of Health   Financial Resource Strain: Not on file  Food Insecurity: Not on file  Transportation Needs: Not on file  Physical Activity: Not on file  Stress: Not on file  Social Connections: Not on file  Intimate Partner Violence: Not on file    Past Medical History, Surgical history, Social history, and Family history were reviewed and updated as appropriate.   Please see review of systems for further details on the patient's review from today.   Objective:   Physical Exam:  There were no vitals taken for this visit.  Physical Exam Constitutional:      General: He is not in acute distress.    Appearance: He is obese.  Musculoskeletal:        General: No deformity.  Neurological:     Mental Status: He is alert and oriented to person, place, and time.     Motor: Abnormal muscle tone present.     Coordination: Coordination normal.     Gait: Gait abnormal.     Comments: A little smaller step length in gait. Mild-mod increase motor tone No sig tremor noticed  Psychiatric:        Attention and Perception: Attention and perception normal.        Mood and Affect: Mood is anxious. Mood is not depressed. Affect is not labile, blunt, angry, tearful or inappropriate.        Speech: Speech normal. Speech is not rapid and pressured.        Behavior: Behavior normal.        Thought Content: Thought content normal. Thought content is not paranoid. Thought content does not include homicidal or suicidal ideation. Thought content does not include homicidal or suicidal plan.        Cognition and Memory: Cognition normal.        Judgment: Judgment normal.     Comments: Insight intact. No auditory or visual hallucinations. No delusions.     Lab Review:     Component Value Date/Time   NA 135 01/06/2020 0724   K 4.8 01/06/2020 0724   CL 99 01/06/2020  0724   CO2 17 (L) 01/06/2020 0724   GLUCOSE 197 (H) 01/06/2020 0724   BUN 18 01/06/2020 0724   CREATININE 1.08  01/06/2020 0724   CALCIUM 9.7 01/06/2020 0724   GFRNONAA 73 01/06/2020 0724   GFRAA 84 01/06/2020 0724    No results found for: WBC, RBC, HGB, HCT, PLT, MCV, MCH, MCHC, RDW, LYMPHSABS, MONOABS, EOSABS, BASOSABS  Lithium Lvl  Date Value Ref Range Status  01/06/2020 1.1 0.5 - 1.2 mmol/L Final    Comment:                                                    Detection Limit = 0.1                                          <0.1 indicates None Detected                               **Please note reference interval change**      No results found for: PHENYTOIN, PHENOBARB, VALPROATE, CBMZ   Last BMP was January 31, 2018 and was within normal limits except for glucose 227, TSH was 4.64, and lithium level was 0.8 which is stable.  .res Assessment: Plan:    Xavian was seen today for follow-up, bipolar i disorder (hcc) and stress.  Diagnoses and all orders for this visit:  Bipolar I disorder ([place]) -     Lithium level -     risperiDONE (RISPERDAL) 1 MG tablet; Take 1 tablet (1 mg total) by mouth at bedtime. -     lithium carbonate (ESKALITH) 450 MG CR tablet; 1 AND 1/2 TABLETS IN THE MORNING AND 2 TABLETS AT NIGHT  Insomnia due to mental condition  Low serum vitamin B12 -     Vitamin B12  Paresthesia -     Vitamin B12  Greater than 50% of 30 min face to face time with patient was spent on counseling and coordination of care. We discussed the following: Long term benefit lithium and risperidone and is stable with past history of psychotic features if not on antipsychotic.  We discussed the short-term risks associated with benzodiazepines including sedation and increased fall risk among others.  Discussed long-term side effect risk including dependence, potential withdrawal symptoms, and the potential eventual dose-related risk of dementia.  But recent studies from 2020 dispute this association between benzodiazepines and dementia risk. Newer studies in 2020 do not support an association with  dementia.  Insomnia relieved with increase lorazepam to 1.5 mg HS.[name]  Need to treat the insomnia because it is a risk factor for mania.  "If my sleep is right I don't have any problems"  Previous Labs good with normal lithium, BMP and TSH in the summer. 01/07/20 Lithium level is stable at 1.1 on a relatively high dose of lithium.  Serum BMP is normal.  No change indicated continue lithium to 900 mg AM and 1 and 1/2 of 450 mg tablets at night. Check lithium level Check B12 for paresthesias.  Counseled patient regarding potential benefits, risks, and side effects of lithium to include potential risk of lithium affecting thyroid and renal function.  Discussed need for  periodic lab monitoring to determine drug level and to assess for potential adverse effects.  Counseled patient regarding signs and symptoms of lithium toxicity and advised that they notify office immediately or seek urgent medical attention if experiencing these signs and symptoms.  Patient advised to contact office with any questions or concerns.  Disc diagnosing PD vs EPS from risperidone.  Disc risk of lowering the dose.  Reduced risperidone to 1/2 of 1 mg HS to see if Parkinsonism is better failed bc more depressed. Therefore continue risperidone 48m HS Sees PCP in a month. Call if sleep is worse.  Anxiety is worse or paranoia.    For dizziness I suspect benign positional vertigo and recommend Epley exercises  FU 3 mos  CLynder Parents MD, DFAPA    Please see After Visit Summary for patient specific instructions.  No future appointments.   Orders Placed This Encounter  Procedures   Lithium level   Vitamin B12       -------------------------------

## 2021-03-29 NOTE — Patient Instructions (Signed)
For dizziness I suspect benign positional vertigo and recommend Epley exercises  Check lithium level and check B12 level

## 2021-03-31 LAB — LITHIUM LEVEL: Lithium Lvl: 1.4 mmol/L (ref 0.5–1.2)

## 2021-03-31 LAB — VITAMIN B12: Vitamin B-12: 377 pg/mL (ref 232–1245)

## 2021-04-16 NOTE — Progress Notes (Signed)
Lithium level is a little high. Confirm that he's taking 3 and 1/2 of the 450 mg tablets daily.  If not let me know.  If so Have him reduce the lithium to 3 tablets daily= 1 in the AM and 2 at night.  His B12 level is in the low normal range.  Have him start a B complex tablet daily .

## 2021-04-21 NOTE — Progress Notes (Signed)
So the lithium level 1.4 was not a trough level and therefore is not actually elevated.  Review of the last progress note notes that the patient has had a tremor we.  We attempted to reduce risperidone but he got more depressed and he had increased risperidone back to 1 mg daily.  He has seen a neurologist about it.  If the neurologist is treating him I do not want to change any medications but if the neurologist is not offered another treatment option we could try low-dose propranolol to treat the tremor and see if that helps.  Let me know what he wants to do.

## 2021-04-28 ENCOUNTER — Other Ambulatory Visit: Payer: Self-pay

## 2021-04-28 ENCOUNTER — Ambulatory Visit: Payer: BC Managed Care – PPO

## 2021-04-28 DIAGNOSIS — Z23 Encounter for immunization: Secondary | ICD-10-CM

## 2021-05-06 ENCOUNTER — Ambulatory Visit: Payer: BC Managed Care – PPO | Admitting: Nurse Practitioner

## 2021-05-06 ENCOUNTER — Encounter: Payer: Self-pay | Admitting: Nurse Practitioner

## 2021-05-06 ENCOUNTER — Other Ambulatory Visit: Payer: Self-pay

## 2021-05-06 VITALS — BP 111/70 | HR 92 | Temp 98.1°F | Resp 20

## 2021-05-06 DIAGNOSIS — M62838 Other muscle spasm: Secondary | ICD-10-CM

## 2021-05-06 MED ORDER — CYCLOBENZAPRINE HCL 5 MG PO TABS
5.0000 mg | ORAL_TABLET | Freq: Every evening | ORAL | 0 refills | Status: AC | PRN
Start: 2021-05-06 — End: 2021-05-16

## 2021-05-06 NOTE — Progress Notes (Signed)
   Subjective:    Patient ID: Carl Melton, male    DOB: Oct 26, 1955, 65 y.o.   MRN: [id number]  HPI  65 year old male presenting to [facility] with complaints of neck pain for 2 weeks. It is now difficult for him to get comfortable at night and sleep. The pain starts in left neck and radiates to the left side of his head. Denies any weakness or numbness in upper extremities. He did suffer from vertigo about one month ago was treated for it at ENT with movement therapy and it resolved.  Denies any confusion, change in LOC, numbness, weakness or forgetfulness. Denies any injury.   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      Review of Systems  Constitutional: Negative.   HENT: Negative.    Respiratory: Negative.    Cardiovascular: Negative.   Musculoskeletal:  Positive for myalgias, neck pain and neck stiffness.  Skin: Negative.   Neurological: Negative.       Objective:   Physical Exam HENT:     Head: Normocephalic.  Musculoskeletal:     Cervical back: Tenderness present. No signs of trauma or bony tenderness. Pain with movement present. Decreased range of motion.       Back:     Comments: Able to rotate head to the right without limitation. Pain and limitation with extension of neck. No pain with flexion. Pain increased with left lateral rotation. Hand and upper extremity strength 5/5 bilaterally. Sensation intact throughout upper extremities.   Skin:    General: Skin is warm.  Neurological:     Mental Status: He is alert.     Cranial Nerves: No facial asymmetry.     Sensory: Sensation is intact.     Motor: No weakness or pronator drift.     Gait: Gait is intact.          Assessment & Plan:  1. Muscle spasms of neck  - cyclobenzaprine (FLEXERIL) 5 MG tablet; Take 1 tablet (5 mg total) by mouth at bedtime as needed for up to 10 days for muscle spasms. Will cause drowsiness take at bedtime only  Dispense: 10 tablet; Refill: 0  Advised warm  compress to neck. Passive ROM. Requested f/u with PCP as this may require imaging of neck. No Neuro deficit today, given recent episode of vertigo followed by neck pain would warrant imaging of cervical spin.   Patient will call PCP if unable to be seen will Return To Occupational Health as discussed.   Instructed to only use muscle relaxant at bedtime. Aware that it will cause drowsiness

## 2021-06-29 ENCOUNTER — Other Ambulatory Visit: Payer: Self-pay

## 2021-06-29 DIAGNOSIS — F5105 Insomnia due to other mental disorder: Secondary | ICD-10-CM

## 2021-07-01 ENCOUNTER — Other Ambulatory Visit: Payer: Self-pay | Admitting: Psychiatry

## 2021-07-01 DIAGNOSIS — F319 Bipolar disorder, unspecified: Secondary | ICD-10-CM

## 2021-07-13 ENCOUNTER — Other Ambulatory Visit: Payer: Self-pay | Admitting: Psychiatry

## 2021-07-13 DIAGNOSIS — F5105 Insomnia due to other mental disorder: Secondary | ICD-10-CM

## 2021-07-14 ENCOUNTER — Other Ambulatory Visit: Payer: Self-pay | Admitting: Psychiatry

## 2021-07-14 DIAGNOSIS — F5105 Insomnia due to other mental disorder: Secondary | ICD-10-CM

## 2021-07-14 MED ORDER — LORAZEPAM 0.5 MG PO TABS: 1.5000 mg | ORAL_TABLET | Freq: Every day | ORAL | 1 refills | Status: DC

## 2021-07-14 NOTE — Telephone Encounter (Signed)
Pt called requesting Rx for Lorazepam 0.5 mg 3 tablet= 1.5 mg total @ bedtime to new pharmacy [facility]. Pt will be out 1/6.   Apt 2/22 Pt # [phone]

## 2021-07-21 ENCOUNTER — Other Ambulatory Visit: Payer: Self-pay

## 2021-07-21 DIAGNOSIS — F319 Bipolar disorder, unspecified: Secondary | ICD-10-CM

## 2021-07-21 MED ORDER — LITHIUM CARBONATE ER 450 MG PO TBCR: EXTENDED_RELEASE_TABLET | ORAL | 1 refills | Status: DC

## 2021-08-24 ENCOUNTER — Other Ambulatory Visit: Payer: Self-pay | Admitting: Psychiatry

## 2021-08-24 DIAGNOSIS — F319 Bipolar disorder, unspecified: Secondary | ICD-10-CM

## 2021-09-01 ENCOUNTER — Ambulatory Visit: Payer: BC Managed Care – PPO | Admitting: Psychiatry

## 2021-09-28 ENCOUNTER — Encounter: Payer: Self-pay | Admitting: Psychiatry

## 2021-09-28 ENCOUNTER — Ambulatory Visit: Payer: BC Managed Care – PPO | Admitting: Psychiatry

## 2021-09-28 ENCOUNTER — Other Ambulatory Visit: Payer: Self-pay

## 2021-09-28 DIAGNOSIS — F319 Bipolar disorder, unspecified: Secondary | ICD-10-CM

## 2021-09-28 DIAGNOSIS — G4733 Obstructive sleep apnea (adult) (pediatric): Secondary | ICD-10-CM | POA: Diagnosis not present

## 2021-09-28 DIAGNOSIS — F5105 Insomnia due to other mental disorder: Secondary | ICD-10-CM | POA: Diagnosis not present

## 2021-09-28 MED ORDER — ALPRAZOLAM 0.5 MG PO TABS: 0.5000 mg | ORAL_TABLET | Freq: Every day | ORAL | 0 refills | Status: DC

## 2021-09-28 MED ORDER — RISPERIDONE 1 MG PO TABS: 1.0000 mg | ORAL_TABLET | Freq: Every day | ORAL | 1 refills | Status: DC

## 2021-09-28 NOTE — Progress Notes (Signed)
Carl Melton ?[id number] ?07-Mar-1956 ?66 y.o. ? ?Subjective:  ? ?Patient ID:  Carl Melton is a 66 y.o. (DOB 27-Nov-1955) male. ? ?Chief Complaint:  ?Chief Complaint  ?Patient presents with  ? Follow-up  ?  Bipolar I disorder ([place])  ? Sleeping Problem  ? ? ?HPI ?Carl Melton presents to the office today for follow-up of bipolar disorder and sleep.   ? ?seen December 2020.  No meds were changed. ? 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   ? ?01/06/2020 appointment with the following noted: ?Continues lithium 450 mg 3 and 1/2 tablets daily, lorazepam 1.5 mg HS, Risperdal 1 mg HS. ?Vaccinated. ?OK overall.  Doing job well with good reports but getting harder to manage multiple pieces.  Want to work 2 more years after this one.  Has good assistants.  Has good rapport with coworkers.  ?Mood been OK.  Only thing he notices is tremor with fine motor things at times.  OK typing.  Done well with work.  Has been able to keep going forward.  Would like to retire sooner than 66 but not sure.  Would stay busy with retirement.   ?Sleep is pretty good and naps after work.  OK during the day.  Has to get up anyway bc nocturia and now having trouble going back too sleep for the last 3-4 mos. 2 nights out of the week. ?Most of the time feeling good.  No significant mood swings No unusual fear.  Sleep is better witn increase in lorazepam without awakening.  Active.  Patient reports stable mood and denies depressed.  Patient denies difficulty with sleep initiation. Denies appetite disturbance.  Patient reports that energy and motivation have been good.  Patient denies any difficulty with concentration.  Patient denies any suicidal ideation.  ?Physical health stable. ?Plan; check lithium level. ?No med changes ? ?07/07/2020 appointment with the following noted: ? Still undecided about retirement but probably a couple of years more.  Job is going OK. ?Occ a little agitated but  not manic and no problems with it.  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  More of it in the last year, bc of a couple of key people leaving but it's better now. ?No mood swings.   ?Not sleeping great.  DM is a problem causing nocturia 4-5 times nightly. ?No sig SE lithium. ?Patient reports stable mood and denies depressed or irritable moods.  Patient denies any recent difficulty with anxiety. Denies appetite disturbance.  Patient reports that energy and motivation have been good.  Patient denies any difficulty with concentration.  Patient denies any suicidal ideation. ?Plan: for nocutria switch lithium to 900 mg AM and  ?1 and 1/2 of 450 mg tablets at night. ? ?12/30/2020 appointment with the following noted: ?Fine with meds but some trouble sleeping but thinks it's stress.  [place] and [place] up once to urinate.  Home up 2-3 times nightly. ?Wonders if he has beginning PD with tremor and slower and stiffer. ?Worries too much.  ?Patient reports stable mood and denies depressed or irritable moods.  Patient denies any recent difficulty with anxiety.  Denies appetite disturbance.  Patient reports that energy and motivation have been good.  Patient denies any difficulty with concentration.  Patient denies any suicidal ideation. ?Plan:  No change indicated except for nocutria switch lithium to 900 mg AM and  ?1 and 1/2 of 450  mg tablets at night. ?Disc diagnosing PD vs EPS from risperidone.  Disc risk of lowering the dose.  ?Reduce risperidone to 1/2 of 1 mg HS to see if Parkinsonism is better ? ?03/29/21 appt noted: ?For 2 weeks on lower risperidone OK and then depressed.  Increased to 1 mg HS and in 2 days he felt better. Tremor, slowness did not improve when reduced risperidone. ?Saw PCP thinks it's not PD ?Postional vertigo with head movment.  Facial paresthesias. ?Nocturia unchanged.  Asks about meds for it. ?No mood swings.   ?Patient reports stable mood and denies depressed or irritable  moods.  Patient denies any recent difficulty with anxiety.  Patient denies difficulty with sleep initiation or maintenance. Denies appetite disturbance.  Patient reports that energy and motivation have been good.  Patient denies any difficulty with concentration.  Patient denies any suicidal ideation. ?Tremor unchanged. ?Plan: Check lithium level ?Check B12 for paresthesias. ?Continue lithium 900 mg every morning and 1-1/2 of the 450 mg tablets at night ?Continue risperidone 1 mg nightly ? ?08/31/21 appt .   ?Pain in neck interfering.  On 3rd doc now.  Pending MRI.  Aggrivatiing. PT didn't help nor chiropracter.  Seeing ortho now. ?Sleeping problem.  To bed 830 and awake 0200 and back to bed after 30 min. ?Mood is ok.  No fear nor anxiety. ?Some daytime drowsiness.  Checking into new CPAP ?12/17/21 will retire. ? ?Past Psychiatric Medication Trials: ?Risperidone 1,  Geodon sedation, haloperidol, Prolixin, Trileptal, gabapentin, sertraline, Wellbutrinlithium, amiloride,  ?lorazepam,  ? ?He has been under our care since December 1994.  He has had psychiatric hospitalization once.  He has been stable on lithium and risperidone since about 2007 ? ?Review of Systems:  ?Review of Systems  ?HENT:  Positive for hearing loss.   ?Cardiovascular:  Negative for chest pain.  ?Genitourinary:  Positive for frequency.  ?Musculoskeletal:  Positive for neck pain.  ?Neurological:  Positive for dizziness and tremors. Negative for weakness.  ?     Balance issues ?Facial paresthesia  ?Psychiatric/Behavioral:  Positive for sleep disturbance. Negative for agitation, behavioral problems, confusion, decreased concentration, dysphoric mood, hallucinations, self-injury and suicidal ideas. The patient is not nervous/anxious and is not hyperactive.   ? ?Medications: I have reviewed the patient's current medications. ? ?Current Outpatient Medications  ?Medication Sig Dispense Refill  ? ALPRAZolam (XANAX) 0.5 MG tablet Take 1 tablet (0.5 mg total) by  mouth at bedtime. 30 tablet 0  ? atorvastatin (LIPITOR) 80 MG tablet Take 80 mg by mouth daily.    ? empagliflozin (JARDIANCE) 25 MG TABS tablet Take 25 mg by mouth daily.    ? felodipine (PLENDIL) 5 MG 24 hr tablet 5 mg daily.   4  ? levothyroxine (SYNTHROID, LEVOTHROID) 88 MCG tablet Take 88 mcg by mouth daily.  5  ? lisinopril (PRINIVIL,ZESTRIL) 20 MG tablet Take 20 mg by mouth daily.  5  ? lithium carbonate (ESKALITH) 450 MG CR tablet 1 AND 1/2 TABLETS IN THE MORNING AND 2 TABLETS AT NIGHT 315 tablet 1  ? loratadine (CLARITIN) 10 MG tablet Take by mouth.    ? meloxicam (MOBIC) 15 MG tablet TAKE 1 TABLET BY MOUTH EVERY DAY 30 tablet 3  ? metFORMIN (GLUCOPHAGE-XR) 500 MG 24 hr tablet Take 1,000 mg by mouth daily.  5  ? naproxen sodium (ALEVE) 220 MG tablet Take 220 mg by mouth.    ? rosuvastatin (CRESTOR) 20 MG tablet Take 20 mg by mouth daily.    ?  Dulaglutide (TRULICITY [place]) Inject into the skin. (Patient not taking: Reported on 05/06/2021)    ? pioglitazone (ACTOS) 30 MG tablet Take 30 mg by mouth daily. (Patient not taking: Reported on 05/06/2021)    ? risperiDONE (RISPERDAL) 1 MG tablet Take 1 tablet (1 mg total) by mouth at bedtime. 90 tablet 1  ? ?Current Facility-Administered Medications  ?Medication Dose Route Frequency Provider Last Rate Last Admin  ? acetaminophen (TYLENOL) tablet 500 mg  500 mg Oral Once Ratcliffe, Heather R, PA-C      ? ? ?Medication Side Effects: None ? ?Allergies:  ?Allergies  ?Allergen Reactions  ? Erythromycin Ethylsuccinate Rash  ? Simvastatin Other (See Comments) and Rash  ? ? ?Past Medical History:  ?Diagnosis Date  ? Bipolar 1 disorder ([place])   ? Depression   ? Diabetes mellitus without complication ([place])   ? type 2  ? Dysrhythmia   ? Hyperlipidemia   ? Hypertension   ? Hypothyroidism   ? LVH (left ventricular hypertrophy)   ? Morbid obesity ([place])   ? Sleep apnea   ? on CPAP  ? Steatohepatitis   ? ? ?History reviewed. No pertinent family history. ? ?Social History   ? ?Socioeconomic History  ? Marital status: Married  ?  Spouse name: Not on file  ? Number of children: Not on file  ? Years of education: Not on file  ? Highest education level: Not on file  ?Occupational History

## 2021-10-01 ENCOUNTER — Other Ambulatory Visit: Payer: Self-pay | Admitting: Psychiatry

## 2021-10-01 ENCOUNTER — Telehealth: Payer: Self-pay | Admitting: Psychiatry

## 2021-10-01 MED ORDER — CLONAZEPAM 0.5 MG PO TABS: 0.5000 mg | ORAL_TABLET | Freq: Every evening | ORAL | 0 refills | Status: DC | PRN

## 2021-10-01 NOTE — Telephone Encounter (Signed)
LVM to rtc 

## 2021-10-01 NOTE — Telephone Encounter (Signed)
Sent RX clonazepam to take at night for sleep instead of the alprazolam or lorazepam ?

## 2021-10-01 NOTE — Telephone Encounter (Signed)
Pt stated he was switched to xanax at his appt Tuesday and it's not helping him sleep.He is also having a hangover feeling the next morning.Please advise

## 2021-10-01 NOTE — Telephone Encounter (Signed)
Called patient with information.  ?

## 2021-10-01 NOTE — Telephone Encounter (Signed)
Pt called reporting his new sleep med is not working well. He never goes to a deep sleep and has hang over feeling in the morning. Contact # [phone]. [place] ?

## 2021-10-02 LAB — LITHIUM LEVEL: Lithium Lvl: 1.2 mmol/L (ref 0.5–1.2)

## 2021-10-05 DIAGNOSIS — M5412 Radiculopathy, cervical region: Secondary | ICD-10-CM | POA: Insufficient documentation

## 2021-10-13 ENCOUNTER — Telehealth: Payer: Self-pay | Admitting: Psychiatry

## 2021-10-13 ENCOUNTER — Other Ambulatory Visit: Payer: Self-pay | Admitting: Psychiatry

## 2021-10-13 DIAGNOSIS — F5105 Insomnia due to other mental disorder: Secondary | ICD-10-CM

## 2021-10-13 MED ORDER — LORAZEPAM 0.5 MG PO TABS: 1.5000 mg | ORAL_TABLET | Freq: Every day | ORAL | 1 refills | Status: DC

## 2021-10-13 NOTE — Telephone Encounter (Signed)
Pt calleda t 9:15 am and said that he would like to go back on lorazapam since the other sleep medicines that were prescribed are not working. He said that the lorazapam worked better than the others. [facility] ?

## 2021-10-21 ENCOUNTER — Other Ambulatory Visit: Payer: Self-pay | Admitting: Psychiatry

## 2021-10-27 ENCOUNTER — Telehealth: Payer: Self-pay | Admitting: Psychiatry

## 2021-10-27 NOTE — Telephone Encounter (Signed)
Referral has been faxed to [facility]. ?

## 2021-10-29 ENCOUNTER — Other Ambulatory Visit (HOSPITAL_BASED_OUTPATIENT_CLINIC_OR_DEPARTMENT_OTHER): Payer: Self-pay

## 2021-10-29 DIAGNOSIS — R454 Irritability and anger: Secondary | ICD-10-CM

## 2021-10-29 DIAGNOSIS — R0681 Apnea, not elsewhere classified: Secondary | ICD-10-CM

## 2021-10-29 DIAGNOSIS — G4710 Hypersomnia, unspecified: Secondary | ICD-10-CM

## 2021-10-29 DIAGNOSIS — R5383 Other fatigue: Secondary | ICD-10-CM

## 2021-10-29 DIAGNOSIS — R0683 Snoring: Secondary | ICD-10-CM

## 2021-10-30 ENCOUNTER — Other Ambulatory Visit: Payer: Self-pay | Admitting: Psychiatry

## 2021-10-30 DIAGNOSIS — F319 Bipolar disorder, unspecified: Secondary | ICD-10-CM

## 2021-11-01 ENCOUNTER — Telehealth: Payer: Self-pay | Admitting: Psychiatry

## 2021-11-01 DIAGNOSIS — F319 Bipolar disorder, unspecified: Secondary | ICD-10-CM

## 2021-11-01 MED ORDER — LITHIUM CARBONATE ER 450 MG PO TBCR: EXTENDED_RELEASE_TABLET | ORAL | 1 refills | Status: DC

## 2021-11-01 NOTE — Telephone Encounter (Signed)
Pt called requesting Rx for Lithium 450 mg to [facility]. He changed back to [facility] in 3/24 ?Apt 9/21 ?

## 2021-12-04 ENCOUNTER — Encounter: Payer: Self-pay | Admitting: Acute Care

## 2021-12-05 ENCOUNTER — Ambulatory Visit (HOSPITAL_BASED_OUTPATIENT_CLINIC_OR_DEPARTMENT_OTHER): Payer: BC Managed Care – PPO | Attending: Psychiatry | Admitting: Internal Medicine

## 2021-12-05 VITALS — Ht 68.0 in | Wt 295.0 lb

## 2021-12-05 DIAGNOSIS — R0683 Snoring: Secondary | ICD-10-CM

## 2021-12-05 DIAGNOSIS — R0681 Apnea, not elsewhere classified: Secondary | ICD-10-CM | POA: Insufficient documentation

## 2021-12-05 DIAGNOSIS — G4710 Hypersomnia, unspecified: Secondary | ICD-10-CM | POA: Diagnosis not present

## 2021-12-05 DIAGNOSIS — R454 Irritability and anger: Secondary | ICD-10-CM | POA: Insufficient documentation

## 2021-12-05 DIAGNOSIS — R0602 Shortness of breath: Secondary | ICD-10-CM | POA: Diagnosis not present

## 2021-12-05 DIAGNOSIS — G4733 Obstructive sleep apnea (adult) (pediatric): Secondary | ICD-10-CM

## 2021-12-05 DIAGNOSIS — R5383 Other fatigue: Secondary | ICD-10-CM | POA: Diagnosis not present

## 2021-12-09 ENCOUNTER — Encounter: Payer: Self-pay | Admitting: Nurse Practitioner

## 2021-12-10 DIAGNOSIS — M5412 Radiculopathy, cervical region: Secondary | ICD-10-CM | POA: Diagnosis not present

## 2021-12-12 DIAGNOSIS — R0683 Snoring: Secondary | ICD-10-CM

## 2021-12-12 NOTE — Procedures (Signed)
Patient Name: Carl Melton, Langille Date: 12/05/2021 Gender: Male D.O.B: 11-23-1955 Age (years): 24 Referring Provider: Lynder Parents MD Height (inches): 21 Interpreting Physician: Baird Lyons MD, ABSM Weight (lbs): 295 RPSGT: Baxter Flattery BMI: 22 MRN: [id number] Neck Size: 19.50  CLINICAL INFORMATION The patient is referred for a BiPAP titration to treat sleep apnea.  Date of NPSG, Split Night or HST: NPSG   05/23/17   AHI 105.7/ hr, desaturation to 72%, body weight 300 lbs  SLEEP STUDY TECHNIQUE As per the AASM Manual for the Scoring of Sleep and Associated Events v2.3 (April 2016) with a hypopnea requiring 4% desaturations.  The channels recorded and monitored were frontal, central and occipital EEG, electrooculogram (EOG), submentalis EMG (chin), nasal and oral airflow, thoracic and abdominal wall motion, anterior tibialis EMG, snore microphone, electrocardiogram, and pulse oximetry. Bilevel positive airway pressure (BPAP) was initiated at the beginning of the study and titrated to treat sleep-disordered breathing.  MEDICATIONS Medications self-administered by patient taken the night of the study : none reported  RESPIRATORY PARAMETERS Optimal IPAP Pressure (cm): 16 AHI at Optimal Pressure (/hr) 0 Optimal EPAP Pressure (cm): 12   Overall Minimal O2 (%): 79.0 Minimal O2 at Optimal Pressure (%): 84.0 SLEEP ARCHITECTURE Start Time: 10:29:17 PM Stop Time: 4:58:22 AM Total Time (min): 389.1 Total Sleep Time (min): 178 Sleep Latency (min): 4.1 Sleep Efficiency (%): 45.7% REM Latency (min): N/A WASO (min): 207.0 Stage N1 (%): 2.5% Stage N2 (%): 97.5% Stage N3 (%): 0.0% Stage R (%): 0 Supine (%): 89.80 Arousal Index (/hr): 4.0   CARDIAC DATA The 2 lead EKG demonstrated sinus rhythm. The mean heart rate was 84.8 beats per minute. Other EKG findings include: , PVCs.  LEG MOVEMENT DATA The total Periodic Limb Movements of Sleep (PLMS) were 0. The PLMS index was 0.0. A PLMS  index of <15 is considered normal in adults.  IMPRESSIONS - CPAP titration did not provide adequate control at tolerated pressure and was converted to BIPAP titration. - An optimal BIPAP pressure was selected for this patient ( 16 / 12 cm of water) - Moderate Central Sleep Apnea was noted during this titration (CAI = 24.9/h). - Oxygen desaturations were observed during this titration (min O2 = 79.0%). Persistent hypoxemia noted on BIPAP 16/12 (Minimum 84%, Mean 86.6%). - Snoring was audible during this study. - Bathroom x 4. - 2-lead EKG demonstrated:  PVCs - Clinically significant periodic limb movements were not noted during this study.   DIAGNOSIS - Obstructive Sleep Apnea (G47.33) - Nocturnal Hypoxemia   RECOMMENDATIONS - Trial of BiPAP therapy on 16/12 cm H2O or autoBIPAP 20-10/ 20/10 with a Large size Fisher[employer]Paykel Full Face Simplus mask and heated humidification. - Supplemental O2 during sleep- probably 2L. Evaluate for cardiopulmonary basis for hypoxemia if appropriate. - Be careful with alcohol, sedatives and other CNS depressants that may worsen sleep apnea and disrupt normal sleep architecture. - Sleep hygiene should be reviewed to assess factors that may improve sleep quality. - Weight management and regular exercise should be initiated or continued. Consider evaluation for frequent nocturia if not alleviated by use of BIPAP.  [Electronically signed] 12/12/2021 12:26 PM  Baird Lyons MD, [place], American Board of Sleep Medicine NPI: [id number]                         [place], [place] [facility]  ELECTRONICALLY SIGNED ON:  12/12/2021, 12:15 PM [place] PH: [phone]  [phone]   FX: [phone] [place]

## 2021-12-13 ENCOUNTER — Telehealth: Payer: Self-pay | Admitting: Psychiatry

## 2021-12-13 NOTE — Telephone Encounter (Signed)
Pt's wife Lvm @ 9:29a.  She says pt had a sleep study done a week ago from yesterday.  They are wanting to know the results before he retires this Friday.  I don't see wife's name on DPR.  She left pt's number to return call to.  Next appt 9/21

## 2021-12-13 NOTE — Telephone Encounter (Signed)
Please review

## 2021-12-14 NOTE — Telephone Encounter (Signed)
Results are not yet available.  Let them know it can take a couple of weeks.

## 2021-12-15 NOTE — Telephone Encounter (Signed)
Pt is able to see results in my chart,I see them as well

## 2021-12-18 ENCOUNTER — Other Ambulatory Visit: Payer: Self-pay | Admitting: Psychiatry

## 2021-12-18 DIAGNOSIS — F319 Bipolar disorder, unspecified: Secondary | ICD-10-CM

## 2021-12-19 ENCOUNTER — Other Ambulatory Visit: Payer: Self-pay | Admitting: Psychiatry

## 2021-12-19 DIAGNOSIS — F5105 Insomnia due to other mental disorder: Secondary | ICD-10-CM

## 2021-12-20 DIAGNOSIS — M5412 Radiculopathy, cervical region: Secondary | ICD-10-CM | POA: Diagnosis not present

## 2021-12-20 DIAGNOSIS — E119 Type 2 diabetes mellitus without complications: Secondary | ICD-10-CM | POA: Diagnosis not present

## 2021-12-20 NOTE — Telephone Encounter (Signed)
Last filled 5/6 appt on 9/21

## 2021-12-22 ENCOUNTER — Telehealth: Payer: Self-pay | Admitting: Psychiatry

## 2021-12-22 NOTE — Telephone Encounter (Signed)
Pt's wife LVM at 2:49p. She said she called last week and she is still waiting on a call back about the status of the sleep study.  Pls advise.  Phone [phone]

## 2021-12-22 NOTE — Telephone Encounter (Signed)
Have we taken care of this yet?

## 2021-12-23 NOTE — Telephone Encounter (Signed)
I had printed it out yesterday, but didn't get up front to get it. As long as he has it to review.

## 2021-12-23 NOTE — Telephone Encounter (Signed)
Addendum to previous message yesterday. Sleep study was faxed today and placed in Dr. Casimiro Needle box.

## 2021-12-28 ENCOUNTER — Telehealth: Payer: Self-pay

## 2021-12-29 NOTE — Telephone Encounter (Signed)
Carl Melton, The sleep study report by Dr. Baird Lyons says "referred for BiPAP titration" which is a sophisticated type of CPAP.  It does not say where the patient was referred.  The patient does not know who to contact or what the next step should be.  I do not know who to refer the patient to either.  He wants to go to the sleep center at [facility] in [place].  I have instructed the patient to call them and see if he can get in there.  They report that the [place] sleep center will not answer the phone.  He had the study done in [place] apparently.  Do have a way of getting in touch with someone at the [facility] center to see where the patient was referred for the BiPAP titration?  They are pretty desperate to get this problem resolved because he is not sleeping. Lynder Parents, MD, DFAPA

## 2021-12-29 NOTE — Telephone Encounter (Signed)
Pt's wife LVM  at 2:08p.  She is not on DPR.  She said pt is not sleeping.  She is not sleeping and they need someone to call them back.  You can reach pt at [phone]  Next appt 9/21

## 2021-12-30 NOTE — Telephone Encounter (Signed)
Pt's wife, [name] Reichmann, has an appt made for [place] next Tuesday with Eric Form, NP at Dr. Janee Morn office. Hopefully that can give them some help.

## 2021-12-30 NOTE — Telephone Encounter (Signed)
Pt has been calling for a couple of weeks now.Carl Melton said she printed results and put them in you box.

## 2022-01-04 ENCOUNTER — Ambulatory Visit (INDEPENDENT_AMBULATORY_CARE_PROVIDER_SITE_OTHER): Payer: BC Managed Care – PPO | Admitting: Acute Care

## 2022-01-04 ENCOUNTER — Encounter: Payer: Self-pay | Admitting: Acute Care

## 2022-01-04 VITALS — BP 124/72 | HR 87 | Ht 68.0 in | Wt 270.0 lb

## 2022-01-04 DIAGNOSIS — G4733 Obstructive sleep apnea (adult) (pediatric): Secondary | ICD-10-CM | POA: Diagnosis not present

## 2022-01-07 DIAGNOSIS — M5412 Radiculopathy, cervical region: Secondary | ICD-10-CM | POA: Diagnosis not present

## 2022-01-12 ENCOUNTER — Telehealth: Payer: Self-pay | Admitting: Acute Care

## 2022-01-12 NOTE — Telephone Encounter (Signed)
Called Carl Melton and provided her with [facility]'s number since that is where CPAP order was sent. Nothing further needed.

## 2022-01-14 DIAGNOSIS — G4733 Obstructive sleep apnea (adult) (pediatric): Secondary | ICD-10-CM | POA: Diagnosis not present

## 2022-01-14 NOTE — Telephone Encounter (Signed)
I cannot see any notes in epic.  Please call the patient or his wife and see how he is doing on his BiPAP.  As he got it fixed?

## 2022-01-17 NOTE — Telephone Encounter (Signed)
They are in the process of getting a new machine it should be here this week.He is on 2 liters of oxygen at night and when he naps.He has an appt with Dr. Annamaria Boots august 12th and they were able to see a nurse practioner at his office as well.

## 2022-01-19 DIAGNOSIS — R0683 Snoring: Secondary | ICD-10-CM | POA: Diagnosis not present

## 2022-01-19 DIAGNOSIS — R0602 Shortness of breath: Secondary | ICD-10-CM | POA: Diagnosis not present

## 2022-01-26 ENCOUNTER — Telehealth: Payer: Self-pay | Admitting: Internal Medicine

## 2022-01-26 DIAGNOSIS — G4733 Obstructive sleep apnea (adult) (pediatric): Secondary | ICD-10-CM

## 2022-01-26 NOTE — Telephone Encounter (Signed)
Patient has been on a BiPap for a week and is not sleeping any better. Patient is scheduled to see CY 8/10 but would like to know what can be done in the meantime. Patient is having around 25 episodes an hour.   Wife's call back number is [phone].

## 2022-01-26 NOTE — Telephone Encounter (Signed)
Called and spoke to wife and she states that patient ha been on bipap for about a week now and still is not sleeping well. Wants to know what can be done before follow up with Dr Annamaria Boots in August  Please advise sir

## 2022-01-26 NOTE — Telephone Encounter (Signed)
Called and spoke with patient and advised hi of the changes to his bipap. And that he was wanting an ONO to be done on his bipap. Patient verbalized understanding. Nothing further needed

## 2022-01-26 NOTE — Telephone Encounter (Signed)
Please ask DME to change BIPAP to 18/14 Recommend to patient that he take his clonazepam 0.5 mg tab every night, about 30 minute before bedtime. Order- Overnight oximetry on BIPAP 18/4 for dx nocturnal hypoxemia. I will see how he is doing on return ov as scheduled.

## 2022-02-02 DIAGNOSIS — G4733 Obstructive sleep apnea (adult) (pediatric): Secondary | ICD-10-CM | POA: Diagnosis not present

## 2022-02-05 ENCOUNTER — Other Ambulatory Visit: Payer: Self-pay | Admitting: Psychiatry

## 2022-02-05 DIAGNOSIS — F319 Bipolar disorder, unspecified: Secondary | ICD-10-CM

## 2022-02-14 DIAGNOSIS — G4733 Obstructive sleep apnea (adult) (pediatric): Secondary | ICD-10-CM | POA: Diagnosis not present

## 2022-02-16 NOTE — Progress Notes (Unsigned)
SG, NP- History of Present Illness Carl Melton is a 66 y.o. male with OSA , presents to the office for a sleep consult , pt. already has a BiPAP / CPAP machine that he has used for years. He has had much worsening sleep disorder breathing over the last year. [name] BiPAP titration was ordered by Dr. Annamaria Boots. Pt. Wants to establish with [facility] for sleep.   02/16/2022 Pt. Presents for sleep consult. He has a PMH of OSA,  depression, DM II, elevated cholesterol, hypothyroid, and obesity. He already has a CPAP machine. ( Wife states it is a BiPAP, but down load showed a CPAP machine) BiPAP titration was done 12/05/2021 because the patient has been having such poor sleep over the last several months. [name] He awakens every hour on CPAP. Current settings are set pressure of 16 cm H2O. He has been managed by a neurology group in  [place] until recently. He and his wife have had difficulty getting help with his worsening sleep pattern, and have decided to come to Pulmonary for management of his severe OSA. He gets his masks and equipment on line.  He has been compliant with his device.90/90 days.  Results are as noted below. Per the split night sleep study done 12/05/2021, CPAP titration did not provide adequate control at tolerated pressure and was converted to BIPAP titration. Optimal BiPAP pressures are ( 16 / 12 cm of water), pt did experience oxygen desaturations to 79%, and oxygen was added at 2 L [place].Pt. has had CPAP  machine for about 10 years. HIs machine is > 39 years old. He has been wearing it at the setting of 16 cm pressure. ( No other settings listed on the down load which is why I think this is a CPAP device.) Down Load  of his current CPAP shows AHI of 17 on a set pressure of 16. He has excessive sleepiness requiring day time naps. He does wear his device with naps. We discussed that napping throughout the day may be one reason he cannot sleep at night.   Pt. Has not been on any oxygen with sleep. I am  concerned this may also be a reason for his poor sleep quality.  He has been evaluated by cardiology, 10/2021 for acute on chronic shortness of breath that has worsened over the last 3 months. He has HTN with stable BP controlled with medication Stress Test Exercise Stress Echo was performed showing Normal test. other than poor exercise tolerance  Down Load today: S9 VPAP Auto >> CPAP Mode  Set Pressure of 16 cm H2O Usage 90/90 days ( 100%) > 4 hours 88 days, < 4 hours 2 days Average usage 6 hours 43 minutes AHI 17   Sleep questionnaire Symptoms-  Pt. Awakens every hour despite current CPAP/ BiPAP use Prior sleep study- Yes Bedtime- 9-9:30 pm Time to fall asleep- not long, 15 minutes Nocturnal awakenings- several, at least 4-5 Out of bed/start of day- I sleep on and off al day and night Weight changes- Per wife, he has lost about 20 pounds in the last year.  Do you operate heavy machinery- NO  Do you currently wear CPAP- He states it is BiPAP machine, but Down Load looks like it is set up as  CPAP.  Do you current wear oxygen- NO [place]-  23-24 ( Scored 3 for everything except falling asleep in car while stopped in traffic)  He appears sleepy today in the office .   Test  Results: 12/05/2021: Split night Sleep Study CPAP titration did not provide adequate control at tolerated pressure and was converted to BIPAP titration. - An optimal BIPAP pressure was selected for this patient ( 16 / 12 cm of water) - Moderate Central Sleep Apnea was noted during this titration (CAI = 24.9/h). - Oxygen desaturations were observed during this titration (min O2 = 79.0%). Persistent hypoxemia noted on BIPAP 16/12 (Minimum 84%, Mean 86.6%). - Snoring was audible during this study. - Bathroom x 4. - 2-lead EKG demonstrated:  PVCs  DIAGNOSIS - Obstructive Sleep Apnea (G47.33) - Nocturnal Hypoxemia    RECOMMENDATIONS - Trial of BiPAP therapy on 16/12 cm H2O or autoBIPAP 20-10/ 20/10 with a Large  size [employer] Full Face Simplus mask and heated humidification. - Supplemental O2 during sleep- probably 2L. Evaluate for cardiopulmonary basis for hypoxem Assessment/Plan Severe OSA  Split Night sleep titration shows need for BiPAP machine An optimal BIPAP pressure was selected for this patient ( 16 / 12 cm of water) - Moderate Central Sleep Apnea was noted during this titration (CAI = 24.9/h). - Oxygen desaturations were observed during this titration (min O2 = 79.0%).  - Persistent hypoxemia noted on BIPAP 16/12 (Minimum 84%, Mean 86.6%). Plan Your Down Load shows you have 17 apnea events per hour.  Your Sleep Study shows CPAP is no longer effective , and you need BiPAP We will order a new BiPAP machine The DME per your new insurance works with  is [facility] [phone] phone [phone] fax number. Setting of Optimal BIPAP pressure was selected for this patient ( 16 / 12 cm of water) or auto BIPAP 20-10/ 20/10 with a Large size [employer] Full Face Simplus mask and heated humidification. Supplemental O2 during sleep- probably 2L.  We will order nocturnal oxygen as you need oxygen at 2 L with sleep, this needs to be bled into your BiPAP machine.  Continue using current CPAP machine until you get your new BiPAP machine. Add the oxygen at 2 L [place] whenever you are wearing your BiPAP if it is delivered to you. Ask the delivery agent to hook the oxygen up to the BiPAP machine . You need to run this at 2 L [place].    Continue on CPAP at bedtime. You appear to be benefiting from the treatment  Goal is to wear for at least 6 hours each night for maximal clinical benefit. Continue to work on weight loss, as the link between excess weight  and sleep apnea is well established.   Remember to establish a good bedtime routine, and work on sleep hygiene.  Limit daytime naps , avoid stimulants such as caffeine and nicotine close to bedtime, exercise daily to promote sleep  quality, avoid heavy , spicy, fried , or rich foods before bed. Ensure adequate exposure to natural light during the day,establish a relaxing bedtime routine with a pleasant sleep environment ( Bedroom between 60 and 67 degrees, turn off bright lights , TV or device screens screens , consider black out curtains or white noise machines) Do not drive if sleepy. Remember to clean mask, tubing, filter, and reservoir once weekly with soapy water.  Follow up with Dr. Annamaria Boots   In 6 weeks  or before as needed.     I spent 50 minutes dedicated to the care of this patient on the date of this encounter to include pre-visit review of records, face-to-face time with the patient discussing conditions above, post visit ordering of testing,  clinical documentation with the electronic health record, making appropriate referrals as documented, and communicating necessary information to the patient's healthcare team.  -------------------------------------------------------- CDY- 02/17/22- 66 yoM former smoker followed for OSA, Insomnia, complicated by HTN, LVH, Steatohepatitis, Hypercholesterolemia, Hypothyroid, DM, Bipolar 1, Depression, Morbid Obesity,  -lorazepam 0.5 mg x 3 hs, clonazepam 0.5 mg hs                 Wife here -note lithium, Risperdal,  Split night study 11/15/21- BIPAP 18/14, PS 4, O2 2L. Replacement machine ordered  01/26/22/ [employer] Presenting c/o to SG was of changing sleep pattern after years on BIPAP. Download compliance- 93%, AHI 5.3/ hr Body weight today-275 lbs ONOX on BIPAP 01/31/22- 6.46 minutes with O2 sat </= 88% -----Pt f/u on CPAP, it is working well only concern is the mask seems to not want to seal. Getting approx 8-9hrs/night. Pt does state he wakes up around 12am and doesn't got back until 2am.  Pt has been feeling SOB/gasp while resting and on exertion since December 2022. Download reviewed. Sleeping better with new machine, but having mask leak. Bedtime 9P, WASO around 11-12 for an  hour, watches TV. Back to sleep until 6:30A. Naps in bed with CPAP 1-2 hrs during day. Lorazepam helped sleep. Drug store ran out so he tried clonazepam 0.5 mg- not as effective. Going to try higher dose. DOE- increased in past year, gradual. Little cough or wheeze. Some weight loss over past year. Smoked 2PPD x 3 yrs, quitting 40 yrs ago. Denies diagnosed heart/ lung disease.  ROS-see HPI  + = positive Constitutional:    +weight loss, night sweats, fevers, chills, fatigue, lassitude. HEENT:    headaches, difficulty swallowing, tooth/dental problems, sore throat,       sneezing, itching, ear ache, nasal congestion, post nasal drip, snoring CV:    chest pain, orthopnea, PND, swelling in lower extremities, anasarca,               dizziness, palpitations Resp:   +shortness of breath with exertion or at rest.                productive cough,   non-productive cough, coughing up of blood.              change in color of mucus.  wheezing.   Skin:    rash or lesions. GI:  No-   heartburn, indigestion, abdominal pain, nausea, vomiting, diarrhea,                 change in bowel habits, loss of appetite GU: dysuria, change in color of urine, no urgency or frequency.   flank pain. MS:   joint pain, stiffness, decreased range of motion, back pain. Neuro-     nothing unusual Psych:  change in mood or affect.  depression or anxiety.   memory loss.  OBJ- Physical Exam General- Alert, Oriented, Affect-appropriate, Distress- none acute, +obese Skin- rash-none, lesions- none, excoriation- none Lymphadenopathy- none Head- atraumatic            Eyes- Gross vision intact, PERRLA, conjunctivae and secretions clear            Ears- Hearing, canals-normal            Nose- Clear, no-Septal dev, mucus, polyps, erosion, perforation             Throat- +S/P UPPP, Mallampati II , mucosa clear , drainage- none, tonsils- atrophic Neck- flexible , trachea midline, no stridor , thyroid nl,  carotid no bruit Chest -  symmetrical excursion , unlabored           Heart/CV- RRR , no murmur , no gallop  , no rub, nl s1 s2                           - JVD- none , edema- none, stasis changes- none, varices- none           Lung- + Shallow, clear to P&A, wheeze- none, cough- none , dullness-none, rub- none           Chest wall-  Abd-  Br/ Gen/ Rectal- Not done, not indicated Extrem- cyanosis- none, clubbing, none, atrophy- none, strength- nl Neuro- grossly intact to observation

## 2022-02-17 ENCOUNTER — Ambulatory Visit (INDEPENDENT_AMBULATORY_CARE_PROVIDER_SITE_OTHER): Payer: No Typology Code available for payment source | Admitting: Internal Medicine

## 2022-02-17 ENCOUNTER — Encounter: Payer: Self-pay | Admitting: Internal Medicine

## 2022-02-17 ENCOUNTER — Ambulatory Visit (INDEPENDENT_AMBULATORY_CARE_PROVIDER_SITE_OTHER): Payer: No Typology Code available for payment source

## 2022-02-17 VITALS — BP 98/60 | HR 80 | Ht 68.0 in | Wt 275.0 lb

## 2022-02-17 DIAGNOSIS — R0609 Other forms of dyspnea: Secondary | ICD-10-CM | POA: Diagnosis not present

## 2022-02-17 DIAGNOSIS — F5101 Primary insomnia: Secondary | ICD-10-CM | POA: Diagnosis not present

## 2022-02-17 DIAGNOSIS — G4734 Idiopathic sleep related nonobstructive alveolar hypoventilation: Secondary | ICD-10-CM

## 2022-02-17 DIAGNOSIS — R0600 Dyspnea, unspecified: Secondary | ICD-10-CM | POA: Diagnosis not present

## 2022-02-17 DIAGNOSIS — G4733 Obstructive sleep apnea (adult) (pediatric): Secondary | ICD-10-CM

## 2022-02-17 DIAGNOSIS — G4700 Insomnia, unspecified: Secondary | ICD-10-CM | POA: Insufficient documentation

## 2022-02-17 NOTE — Assessment & Plan Note (Signed)
Chronic problem, aggravated by poor sleep habits. Plan- Education. Minimize daytime naps- try to get up and moving if feeling drowsy. Ok using available supply to try clonazepam 0.5 mg x 2 instead of lorazepam or melatonin.

## 2022-02-17 NOTE — Patient Instructions (Signed)
Order- refer for mask fitting at sleep center  Order- schedule PFT    dx dyspnea on exertion  Order- CXR   dx dyspnea on exertion  We will give you a phone number for [employer] oxygen supply. You can ask them what is available in the way of a lighter continuous flow oxygen concentrator that could provide 2-3 liters/ minute for your BIPAP machine when you travel.  Retry your leftover clonazepam for sleep - try 2 tabs instead of lorazepam.  Try to reduce your naps if you want to sleep more solidly through the night.

## 2022-02-17 NOTE — Assessment & Plan Note (Signed)
Benefits from BIPAP with O2 Plan- Continue BIPAP 18/14, PS4 with O2 2l           Refer for mask fitting

## 2022-02-17 NOTE — Assessment & Plan Note (Signed)
Primary problem is likely obesity with deconditioning. Plan- CXR, PFT

## 2022-02-17 NOTE — Assessment & Plan Note (Signed)
Body habitus favors obesity hypoventilation syndrome Plan- continue O2 2L through BIPAP

## 2022-02-19 DIAGNOSIS — R0602 Shortness of breath: Secondary | ICD-10-CM | POA: Diagnosis not present

## 2022-02-19 DIAGNOSIS — R0683 Snoring: Secondary | ICD-10-CM | POA: Diagnosis not present

## 2022-02-23 DIAGNOSIS — F319 Bipolar disorder, unspecified: Secondary | ICD-10-CM | POA: Diagnosis not present

## 2022-02-23 DIAGNOSIS — E7800 Pure hypercholesterolemia, unspecified: Secondary | ICD-10-CM | POA: Diagnosis not present

## 2022-02-23 DIAGNOSIS — E118 Type 2 diabetes mellitus with unspecified complications: Secondary | ICD-10-CM | POA: Diagnosis not present

## 2022-02-23 DIAGNOSIS — E039 Hypothyroidism, unspecified: Secondary | ICD-10-CM | POA: Diagnosis not present

## 2022-02-24 ENCOUNTER — Telehealth: Payer: Self-pay | Admitting: Internal Medicine

## 2022-02-24 NOTE — Telephone Encounter (Signed)
Called Lyn from [employer] and she states she is faxing over an order for a portable concentrator for patient to use during travel with his cpap. Will await fax. Nothing further needed

## 2022-03-09 DIAGNOSIS — E039 Hypothyroidism, unspecified: Secondary | ICD-10-CM | POA: Diagnosis not present

## 2022-03-09 DIAGNOSIS — G4733 Obstructive sleep apnea (adult) (pediatric): Secondary | ICD-10-CM | POA: Diagnosis not present

## 2022-03-09 DIAGNOSIS — F319 Bipolar disorder, unspecified: Secondary | ICD-10-CM | POA: Diagnosis not present

## 2022-03-09 DIAGNOSIS — E7800 Pure hypercholesterolemia, unspecified: Secondary | ICD-10-CM | POA: Diagnosis not present

## 2022-03-09 DIAGNOSIS — E118 Type 2 diabetes mellitus with unspecified complications: Secondary | ICD-10-CM | POA: Diagnosis not present

## 2022-03-17 DIAGNOSIS — G4733 Obstructive sleep apnea (adult) (pediatric): Secondary | ICD-10-CM | POA: Diagnosis not present

## 2022-03-22 DIAGNOSIS — R0602 Shortness of breath: Secondary | ICD-10-CM | POA: Diagnosis not present

## 2022-03-22 DIAGNOSIS — R0683 Snoring: Secondary | ICD-10-CM | POA: Diagnosis not present

## 2022-03-29 ENCOUNTER — Ambulatory Visit (HOSPITAL_BASED_OUTPATIENT_CLINIC_OR_DEPARTMENT_OTHER): Payer: No Typology Code available for payment source | Attending: Internal Medicine | Admitting: Radiology

## 2022-03-29 ENCOUNTER — Other Ambulatory Visit: Payer: Self-pay | Admitting: Psychiatry

## 2022-03-29 DIAGNOSIS — F319 Bipolar disorder, unspecified: Secondary | ICD-10-CM

## 2022-03-29 DIAGNOSIS — G4733 Obstructive sleep apnea (adult) (pediatric): Secondary | ICD-10-CM

## 2022-03-30 DIAGNOSIS — M899 Disorder of bone, unspecified: Secondary | ICD-10-CM | POA: Insufficient documentation

## 2022-03-30 DIAGNOSIS — Z789 Other specified health status: Secondary | ICD-10-CM | POA: Insufficient documentation

## 2022-03-30 DIAGNOSIS — G894 Chronic pain syndrome: Secondary | ICD-10-CM | POA: Insufficient documentation

## 2022-03-30 DIAGNOSIS — Z79899 Other long term (current) drug therapy: Secondary | ICD-10-CM | POA: Insufficient documentation

## 2022-03-30 NOTE — Progress Notes (Unsigned)
Patient: Carl Melton  Service Category: E/M  Provider: Gaspar Cola, MD  DOB: May 11, 1956  DOS: 03/31/2022  Referring Provider: Carmelia Roller, MD  MRN: [id number]  Setting: Ambulatory outpatient  PCP: Kirk Ruths, MD  Type: New Patient  Specialty: Interventional Pain Management    Location: Office  Delivery: Face-to-face     Primary Reason(s) for Visit: Encounter for initial evaluation of one or more chronic problems (new to examiner) potentially causing chronic pain, and posing a threat to normal musculoskeletal function. (Level of risk: High) CC: No chief complaint on file.  HPI  Carl Melton is a 66 y.o. year old, male patient, who comes for the first time to our practice referred by Carmelia Roller, MD for our initial evaluation of his chronic pain. He has Diabetes mellitus ([place]); Encounter for general adult medical examination without abnormal findings; Adult hypothyroidism; Hypertensive left ventricular hypertrophy; Morbid obesity ([place]); Steatohepatitis; Hypothyroid; DM (diabetes mellitus) ([place]); Health care maintenance; LVH (left ventricular hypertrophy) due to hypertensive disease; Pure hypercholesterolemia; OSA treated with BiPAP; Bipolar 1 disorder, mixed, moderate ([place]); Nocturnal hypoxemia; Dyspnea on exertion; Insomnia; Cervical radiculopathy; Controlled type 2 diabetes mellitus with complication, without long-term current use of insulin ([place]); Chronic pain syndrome; Pharmacologic therapy; Disorder of skeletal system; and Problems influencing health status on their problem list. Today he comes in for evaluation of his No chief complaint on file.  Pain Assessment: Location:     Radiating:   Onset:   Duration:   Quality:   Severity:  /10 (subjective, self-reported pain score)  Effect on ADL:   Timing:   Modifying factors:   BP:    HR:    Onset and Duration: {Hx; Onset and Duration:210120511} Cause of pain: {Hx; Cause:210120521} Severity: {Pain  Severity:210120502} Timing: {Symptoms; Timing:210120501} Aggravating Factors: {Causes; Aggravating pain factors:210120507} Alleviating Factors: {Causes; Alleviating Factors:210120500} Associated Problems: {Hx; Associated problems:210120515} Quality of Pain: {Hx; Symptom quality or Descriptor:210120531} Previous Examinations or Tests: {Hx; Previous examinations or test:210120529} Previous Treatments: {Hx; Previous Treatment:210120503}  ***  Today I took the time to provide the patient with information regarding my pain practice. The patient was informed that my practice is divided into two sections: an interventional pain management section, as well as a completely separate and distinct medication management section. I explained that I have procedure days for my interventional therapies, and evaluation days for follow-ups and medication management. Because of the amount of documentation required during both, they are kept separated. This means that there is the possibility that he may be scheduled for a procedure on one day, and medication management the next. I have also informed him that because of staffing and facility limitations, I no longer take patients for medication management only. To illustrate the reasons for this, I gave the patient the example of surgeons, and how inappropriate it would be to refer a patient to his/her care, just to write for the post-surgical antibiotics on a surgery done by a different surgeon.   Because interventional pain management is my board-certified specialty, the patient was informed that joining my practice means that they are open to any and all interventional therapies. I made it clear that this does not mean that they will be forced to have any procedures done. What this means is that I believe interventional therapies to be essential part of the diagnosis and proper management of chronic pain conditions. Therefore, patients not interested in these  interventional alternatives will be better served under the care of a different  practitioner.  The patient was also made aware of my Comprehensive Pain Management Safety Guidelines where by joining my practice, they limit all of their nerve blocks and joint injections to those done by our practice, for as long as we are retained to manage their care.   Historic Controlled Substance Pharmacotherapy Review  PMP and historical list of controlled substances: Lorazepam 0.5 mg tablet, 1 tab p.o. 3 times daily (# 90) (last filled on 02/13/2022); clonazepam 0.5 mg tablet, 1 tab p.o. daily (# 15) (last filled on 10/01/2021) Current opioid analgesics: None MME/day: 0 mg/day  Historical Monitoring: The patient  reports no history of drug use. List of prior UDS Testing: No results found for: "MDMA", "COCAINSCRNUR", "PCPSCRNUR", "PCPQUANT", "CANNABQUANT", "THCU", "ETH", "CBDTHCR", "D8THCCBX", "D9THCCBX" Historical Background Evaluation: [place] PMP: PDMP reviewed during this encounter. Review of the past 34-month conducted.             PMP NARX Score Report:  Narcotic: 140 Sedative: 320 Stimulant: 000 [place] Department of public safety, offender search: (Editor, commissioningInformation) Non-contributory Risk Assessment Profile: Aberrant behavior: None observed or detected today Risk factors for fatal opioid overdose: None identified today PMP NARX Overdose Risk Score: 090 Fatal overdose hazard ratio (HR): Calculation deferred Non-fatal overdose hazard ratio (HR): Calculation deferred Risk of opioid abuse or dependence: 0.7-3.0% with doses ? 36 MME/day and 6.1-26% with doses ? 120 MME/day. Substance use disorder (SUD) risk level: See below Personal History of Substance Abuse (SUD-Substance use disorder):  Alcohol:    Illegal Drugs:    Rx Drugs:    ORT Risk Level calculation:    ORT Scoring interpretation table:  Score <3 = Low Risk for SUD  Score between 4-7 = Moderate Risk for SUD  Score >8 = High Risk for Opioid  Abuse   PHQ-2 Depression Scale:  Total score:    PHQ-2 Scoring interpretation table: (Score and probability of major depressive disorder)  Score 0 = No depression  Score 1 = 15.4% Probability  Score 2 = 21.1% Probability  Score 3 = 38.4% Probability  Score 4 = 45.5% Probability  Score 5 = 56.4% Probability  Score 6 = 78.6% Probability   PHQ-9 Depression Scale:  Total score:    PHQ-9 Scoring interpretation table:  Score 0-4 = No depression  Score 5-9 = Mild depression  Score 10-14 = Moderate depression  Score 15-19 = Moderately severe depression  Score 20-27 = Severe depression (2.4 times higher risk of SUD and 2.89 times higher risk of overuse)   Pharmacologic Plan: As per protocol, I have not taken over any controlled substance management, pending the results of ordered tests and/or consults.            Initial impression: Pending review of available data and ordered tests.  Meds   Current Outpatient Medications:    atorvastatin (LIPITOR) 80 MG tablet, Take 80 mg by mouth daily., Disp: , Rfl:    clonazePAM (KLONOPIN) 0.5 MG tablet, Take 1 tablet (0.5 mg total) by mouth at bedtime as needed for anxiety., Disp: 15 tablet, Rfl: 0   Dulaglutide (TRULICITY [place]), Inject into the skin., Disp: , Rfl:    empagliflozin (JARDIANCE) 25 MG TABS tablet, Take 25 mg by mouth daily., Disp: , Rfl:    felodipine (PLENDIL) 5 MG 24 hr tablet, 5 mg daily. , Disp: , Rfl: 4   levothyroxine (SYNTHROID, LEVOTHROID) 88 MCG tablet, Take 88 mcg by mouth daily., Disp: , Rfl: 5   lisinopril (PRINIVIL,ZESTRIL) 20 MG tablet, Take 20  mg by mouth daily., Disp: , Rfl: 5   lithium carbonate (ESKALITH) 450 MG CR tablet, 1 AND 1/2 TABLETS IN THE MORNING AND 2 TABLETS AT NIGHT, Disp: 315 tablet, Rfl: 0   loratadine (CLARITIN) 10 MG tablet, Take by mouth., Disp: , Rfl:    LORazepam (ATIVAN) 0.5 MG tablet, TAKE 3 TABLETS (1.5 MG TOTAL) BY MOUTH AT BEDTIME., Disp: 90 tablet, Rfl: 3   meloxicam (MOBIC) 15 MG tablet, TAKE  1 TABLET BY MOUTH EVERY DAY, Disp: 30 tablet, Rfl: 3   metFORMIN (GLUCOPHAGE-XR) 500 MG 24 hr tablet, Take 1,000 mg by mouth daily., Disp: , Rfl: 5   naproxen sodium (ALEVE) 220 MG tablet, Take 220 mg by mouth., Disp: , Rfl:    pioglitazone (ACTOS) 30 MG tablet, Take 30 mg by mouth daily., Disp: , Rfl:    risperiDONE (RISPERDAL) 1 MG tablet, TAKE 1 TABLET BY MOUTH EVERYDAY AT BEDTIME, Disp: 90 tablet, Rfl: 0   rosuvastatin (CRESTOR) 20 MG tablet, Take 20 mg by mouth daily., Disp: , Rfl:   Current Facility-Administered Medications:    acetaminophen (TYLENOL) tablet 500 mg, 500 mg, Oral, Once, Ratcliffe, Heather R, PA-C  Imaging Review   Complexity Note: Imaging results reviewed.                         ROS  Cardiovascular: {Hx; Cardiovascular History:210120525} Pulmonary or Respiratory: {Hx; Pumonary and/or Respiratory History:210120523} Neurological: {Hx; Neurological:210120504} Psychological-Psychiatric: {Hx; Psychological-Psychiatric History:210120512} Gastrointestinal: {Hx; Gastrointestinal:210120527} Genitourinary: {Hx; Genitourinary:210120506} Hematological: {Hx; Hematological:210120510} Endocrine: {Hx; Endocrine history:210120509} Rheumatologic: {Hx; Rheumatological:210120530} Musculoskeletal: {Hx; Musculoskeletal:210120528} Work History: {Hx; Work history:210120514}  Allergies  Mr. Mcclurg is allergic to erythromycin ethylsuccinate and simvastatin.  Laboratory Chemistry Profile   Renal Lab Results  Component Value Date   BUN 18 01/06/2020   CREATININE 1.08 01/06/2020   BCR 17 01/06/2020   GFRAA 84 01/06/2020   GFRNONAA 73 01/06/2020     Electrolytes Lab Results  Component Value Date   NA 135 01/06/2020   K 4.8 01/06/2020   CL 99 01/06/2020   CALCIUM 9.7 01/06/2020     Hepatic No results found for: "AST", "ALT", "ALBUMIN", "ALKPHOS", "AMYLASE", "LIPASE", "AMMONIA"   ID Lab Results  Component Value Date   SARSCOV2NAA NEGATIVE 05/07/2020     Bone No  results found for: "VD25OH", "VD125OH2TOT", "WY6378HY8", "FO2774JO8", "25OHVITD1", "25OHVITD2", "25OHVITD3", "TESTOFREE", "TESTOSTERONE"   Endocrine Lab Results  Component Value Date   GLUCOSE 197 (H) 01/06/2020   TSH 4.120 01/06/2020     Neuropathy Lab Results  Component Value Date   NOMVEHMC94 709 03/30/2021     CNS No results found for: "COLORCSF", "APPEARCSF", "RBCCOUNTCSF", "WBCCSF", "POLYSCSF", "LYMPHSCSF", "EOSCSF", "PROTEINCSF", "GLUCCSF", "JCVIRUS", "CSFOLI", "IGGCSF", "LABACHR", "ACETBL"   Inflammation (CRP: Acute  ESR: Chronic) No results found for: "CRP", "ESRSEDRATE", "LATICACIDVEN"   Rheumatology No results found for: "RF", "ANA", "LABURIC", "URICUR", "LYMEIGGIGMAB", "LYMEABIGMQN", "HLAB27"   Coagulation No results found for: "INR", "LABPROT", "APTT", "PLT", "DDIMER", "LABHEMA", "VITAMINK1", "AT3"   Cardiovascular No results found for: "BNP", "CKTOTAL", "CKMB", "TROPONINI", "HGB", "HCT", "LABVMA", "EPIRU", "GGEZMOQ94TML", "NOREPRU", "NOREPI24HUR", "DOPARU", "DOPAM24HRUR"   Screening Lab Results  Component Value Date   SARSCOV2NAA NEGATIVE 05/07/2020     Cancer No results found for: "CEA", "CA125", "LABCA2"   Allergens No results found for: "ALMOND", "APPLE", "ASPARAGUS", "AVOCADO", "BANANA", "BARLEY", "BASIL", "BAYLEAF", "GREENBEAN", "LIMABEAN", "WHITEBEAN", "BEEFIGE", "REDBEET", "BLUEBERRY", "BROCCOLI", "CABBAGE", "MELON", "CARROT", "CASEIN", "CASHEWNUT", "CAULIFLOWER", "CELERY"     Note: Lab results reviewed.  PFSH  Drug: Mr.  Vanlue  reports no history of drug use. Alcohol:  reports current alcohol use of about 4.0 standard drinks of alcohol per week. Tobacco:  reports that he quit smoking about 42 years ago. His smoking use included cigarettes. He started smoking about 48 years ago. He has a 12.00 pack-year smoking history. He has never used smokeless tobacco. Medical:  has a past medical history of Bipolar 1 disorder ([place]), Depression, Diabetes mellitus  without complication ([place]), Dysrhythmia, Hyperlipidemia, Hypertension, Hypothyroidism, LVH (left ventricular hypertrophy), Morbid obesity ([place]), Sleep apnea, and Steatohepatitis. Family: family history is not on file.  Past Surgical History:  Procedure Laterality Date   APPENDECTOMY     COLONOSCOPY  01/04/2013, 08/04/2009   COLONOSCOPY WITH PROPOFOL N/A 08/06/2018   Procedure: COLONOSCOPY WITH PROPOFOL;  Surgeon: Lollie Sails, MD;  Location: [facility] ENDOSCOPY;  Service: Endoscopy;  Laterality: N/A;   COLONOSCOPY WITH PROPOFOL N/A 05/11/2020   Procedure: COLONOSCOPY WITH PROPOFOL;  Surgeon: [name], MD;  Location: [facility] ENDOSCOPY;  Service: Endoscopy;  Laterality: N/A;   ESOPHAGOGASTRODUODENOSCOPY  03/21/2002   KNEE SURGERY     UVULOPALATOPHARYNGOPLASTY  1997   Active Ambulatory Problems    Diagnosis Date Noted   Diabetes mellitus ([place]) 12/01/2013   Encounter for general adult medical examination without abnormal findings 05/25/2015   Adult hypothyroidism 12/01/2013   Hypertensive left ventricular hypertrophy 12/01/2013   Morbid obesity ([place]) 12/01/2013   Steatohepatitis 12/01/2013   Hypothyroid 12/01/2013   DM (diabetes mellitus) ([place]) 12/01/2013   Health care maintenance 05/25/2015   LVH (left ventricular hypertrophy) due to hypertensive disease 12/01/2013   Pure hypercholesterolemia 05/25/2015   OSA treated with BiPAP 12/01/2013   Bipolar 1 disorder, mixed, moderate ([facility]) 06/24/2018   Nocturnal hypoxemia 02/17/2022   Dyspnea on exertion 02/17/2022   Insomnia 02/17/2022   Cervical radiculopathy 10/05/2021   Controlled type 2 diabetes mellitus with complication, without long-term current use of insulin ([facility]) 12/01/2013   Chronic pain syndrome 03/30/2022   Pharmacologic therapy 03/30/2022   Disorder of skeletal system 03/30/2022   Problems influencing health status 03/30/2022   Resolved Ambulatory Problems    Diagnosis Date Noted   Obstructive apnea 12/01/2013    Past Medical History:  Diagnosis Date   Bipolar 1 disorder ([place])    Depression    Diabetes mellitus without complication ([place])    Dysrhythmia    Hyperlipidemia    Hypertension    Hypothyroidism    LVH (left ventricular hypertrophy)    Sleep apnea    Constitutional Exam  General appearance: Well nourished, well developed, and well hydrated. In no apparent acute distress There were no vitals filed for this visit. BMI Assessment: Estimated body mass index is 41.81 kg/m as calculated from the following:   Height as of 03/29/22: 5' 8"  (1.727 m).   Weight as of 03/29/22: 275 lb (124.7 kg).  BMI interpretation table: BMI level Category Range association with higher incidence of chronic pain  <18 kg/m2 Underweight   18.5-24.9 kg/m2 Ideal body weight   25-29.9 kg/m2 Overweight Increased incidence by 20%  30-34.9 kg/m2 Obese (Class I) Increased incidence by 68%  35-39.9 kg/m2 Severe obesity (Class II) Increased incidence by 136%  >40 kg/m2 Extreme obesity (Class III) Increased incidence by 254%   Patient's current BMI Ideal Body weight  There is no height or weight on file to calculate BMI. Ideal body weight: 68.4 kg (150 lb 12.7 oz) Adjusted ideal body weight: 90.9 kg (200 lb 7.6 oz)   BMI Readings from Last  4 Encounters:  03/29/22 41.81 kg/m  02/17/22 41.81 kg/m  01/04/22 41.05 kg/m  12/05/21 44.85 kg/m   Wt Readings from Last 4 Encounters:  03/29/22 275 lb (124.7 kg)  02/17/22 275 lb (124.7 kg)  01/04/22 270 lb (122.5 kg)  12/05/21 295 lb (133.8 kg)    Psych/Mental status: Alert, oriented x 3 (person, place, & time)       Eyes: PERLA Respiratory: No evidence of acute respiratory distress  Assessment  Primary Diagnosis & Pertinent Problem List: The primary encounter diagnosis was Chronic pain syndrome. Diagnoses of Pharmacologic therapy, Disorder of skeletal system, and Problems influencing health status were also pertinent to this visit.  Visit Diagnosis (New  problems to examiner): 1. Chronic pain syndrome   2. Pharmacologic therapy   3. Disorder of skeletal system   4. Problems influencing health status    Plan of Care (Initial workup plan)  Note: Mr. Eschbach was reminded that as per protocol, today's visit has been an evaluation only. We have not taken over the patient's controlled substance management.  Problem-specific plan: No problem-specific Assessment & Plan notes found for this encounter.  Lab Orders  No laboratory test(s) ordered today   Imaging Orders  No imaging studies ordered today   Referral Orders  No referral(s) requested today   Procedure Orders    No procedure(s) ordered today   Pharmacotherapy (current): Medications ordered:  No orders of the defined types were placed in this encounter.  Medications administered during this visit: Sekai Gitlin. Sobecki had no medications administered during this visit.   Pharmacological management options:  Opioid Analgesics: The patient was informed that there is no guarantee that he would be a candidate for opioid analgesics. The decision will be made following CDC guidelines. This decision will be based on the results of diagnostic studies, as well as Mr. Mcghie's risk profile.   Membrane stabilizer: To be determined at a later time  Muscle relaxant: To be determined at a later time  NSAID: To be determined at a later time  Other analgesic(s): To be determined at a later time   Interventional management options: Mr. Oros was informed that there is no guarantee that he would be a candidate for interventional therapies. The decision will be based on the results of diagnostic studies, as well as Mr. Vaughn's risk profile.  Procedure(s) under consideration:  Pending results of ordered studies      Interventional Therapies  Risk  Complexity Considerations:   Estimated body mass index is 41.81 kg/m as calculated from the following:   Height as of 03/29/22: 5' 8"  (1.727 m).    Weight as of 03/29/22: 275 lb (124.7 kg). WNL   Planned  Pending:   Pending further evaluation   Under consideration:   ***   Completed:   None at this time   Completed by other providers:   None at this time   Therapeutic  Palliative (PRN) options:   None established      Provider-requested follow-up: No follow-ups on file.  Future Appointments  Date Time Provider [place]  03/31/2022 10:00 AM Milinda Pointer, MD [facility] None  03/31/2022  1:00 PM Cottle, Billey Co., MD [facility] None  05/24/2022  1:00 PM [facility] PFT ROOM [facility] None  05/24/2022  2:00 PM Deneise Lever, MD [facility] None    Note by: Gaspar Cola, MD Date: 03/31/2022; Time: 4:14 PM

## 2022-03-31 ENCOUNTER — Encounter: Payer: Self-pay | Admitting: Pain Medicine

## 2022-03-31 ENCOUNTER — Ambulatory Visit
Admission: RE | Admit: 2022-03-31 | Discharge: 2022-03-31 | Disposition: A | Discharge status: Home / Self Care | Payer: No Typology Code available for payment source | Source: Ambulatory Visit | Attending: Pain Medicine | Admitting: Pain Medicine

## 2022-03-31 ENCOUNTER — Ambulatory Visit
Admission: RE | Admit: 2022-03-31 | Discharge: 2022-03-31 | Disposition: A | Discharge status: Home / Self Care | Payer: No Typology Code available for payment source | Attending: Pain Medicine | Admitting: Pain Medicine

## 2022-03-31 ENCOUNTER — Ambulatory Visit (HOSPITAL_BASED_OUTPATIENT_CLINIC_OR_DEPARTMENT_OTHER): Payer: No Typology Code available for payment source | Admitting: Pain Medicine

## 2022-03-31 ENCOUNTER — Ambulatory Visit: Payer: No Typology Code available for payment source | Admitting: Psychiatry

## 2022-03-31 VITALS — BP 124/65 | HR 81 | Temp 97.2°F | Ht 68.0 in | Wt 285.0 lb

## 2022-03-31 DIAGNOSIS — Z0000 Encounter for general adult medical examination without abnormal findings: Secondary | ICD-10-CM | POA: Diagnosis not present

## 2022-03-31 DIAGNOSIS — R937 Abnormal findings on diagnostic imaging of other parts of musculoskeletal system: Secondary | ICD-10-CM

## 2022-03-31 DIAGNOSIS — M899 Disorder of bone, unspecified: Secondary | ICD-10-CM | POA: Insufficient documentation

## 2022-03-31 DIAGNOSIS — G8929 Other chronic pain: Secondary | ICD-10-CM | POA: Insufficient documentation

## 2022-03-31 DIAGNOSIS — Z789 Other specified health status: Secondary | ICD-10-CM | POA: Insufficient documentation

## 2022-03-31 DIAGNOSIS — M542 Cervicalgia: Secondary | ICD-10-CM

## 2022-03-31 DIAGNOSIS — M4692 Unspecified inflammatory spondylopathy, cervical region: Secondary | ICD-10-CM | POA: Diagnosis not present

## 2022-03-31 DIAGNOSIS — Z87898 Personal history of other specified conditions: Secondary | ICD-10-CM | POA: Insufficient documentation

## 2022-03-31 DIAGNOSIS — M479 Spondylosis, unspecified: Secondary | ICD-10-CM | POA: Insufficient documentation

## 2022-03-31 DIAGNOSIS — G894 Chronic pain syndrome: Secondary | ICD-10-CM

## 2022-03-31 DIAGNOSIS — M47812 Spondylosis without myelopathy or radiculopathy, cervical region: Secondary | ICD-10-CM | POA: Diagnosis not present

## 2022-03-31 DIAGNOSIS — Z79899 Other long term (current) drug therapy: Secondary | ICD-10-CM | POA: Insufficient documentation

## 2022-03-31 DIAGNOSIS — R42 Dizziness and giddiness: Secondary | ICD-10-CM | POA: Insufficient documentation

## 2022-03-31 NOTE — Progress Notes (Signed)
Safety precautions to be maintained throughout the outpatient stay will include: orient to surroundings, keep bed in low position, maintain call bell within reach at all times, provide assistance with transfer out of bed and ambulation.  

## 2022-04-06 LAB — COMPLIANCE DRUG ANALYSIS, UR

## 2022-04-07 LAB — COMP. METABOLIC PANEL (12)
AST: 8 IU/L (ref 0–40)
Albumin/Globulin Ratio: 2.1 (ref 1.2–2.2)
Albumin: 5 g/dL — ABNORMAL HIGH (ref 3.9–4.9)
Alkaline Phosphatase: 57 IU/L (ref 44–121)
BUN/Creatinine Ratio: 15 (ref 10–24)
BUN: 16 mg/dL (ref 8–27)
Bilirubin Total: 0.3 mg/dL (ref 0.0–1.2)
Calcium: 9.7 mg/dL (ref 8.6–10.2)
Chloride: 102 mmol/L (ref 96–106)
Creatinine, Ser: 1.06 mg/dL (ref 0.76–1.27)
Globulin, Total: 2.4 g/dL (ref 1.5–4.5)
Glucose: 175 mg/dL — ABNORMAL HIGH (ref 70–99)
Potassium: 4.5 mmol/L (ref 3.5–5.2)
Sodium: 138 mmol/L (ref 134–144)
Total Protein: 7.4 g/dL (ref 6.0–8.5)
eGFR: 78 mL/min/{1.73_m2} (ref >59)

## 2022-04-07 LAB — SEDIMENTATION RATE: Sed Rate: 16 mm/hr (ref 0–30)

## 2022-04-07 LAB — 25-HYDROXY VITAMIN D LCMS D2+D3
25-Hydroxy, Vitamin D-2: <1 ng/mL
25-Hydroxy, Vitamin D-3: 13 ng/mL
25-Hydroxy, Vitamin D: 13 ng/mL — ABNORMAL LOW

## 2022-04-07 LAB — VITAMIN B12: Vitamin B-12: 297 pg/mL (ref 232–1245)

## 2022-04-07 LAB — C-REACTIVE PROTEIN: CRP: <1 mg/L (ref 0–10)

## 2022-04-07 LAB — MAGNESIUM: Magnesium: 2 mg/dL (ref 1.6–2.3)

## 2022-04-11 ENCOUNTER — Ambulatory Visit: Payer: No Typology Code available for payment source | Admitting: Pain Medicine

## 2022-04-11 DIAGNOSIS — Z23 Encounter for immunization: Secondary | ICD-10-CM | POA: Diagnosis not present

## 2022-04-12 DIAGNOSIS — L570 Actinic keratosis: Secondary | ICD-10-CM | POA: Diagnosis not present

## 2022-04-12 DIAGNOSIS — D2262 Melanocytic nevi of left upper limb, including shoulder: Secondary | ICD-10-CM | POA: Diagnosis not present

## 2022-04-12 DIAGNOSIS — D2272 Melanocytic nevi of left lower limb, including hip: Secondary | ICD-10-CM | POA: Diagnosis not present

## 2022-04-12 DIAGNOSIS — D2271 Melanocytic nevi of right lower limb, including hip: Secondary | ICD-10-CM | POA: Diagnosis not present

## 2022-04-12 DIAGNOSIS — D225 Melanocytic nevi of trunk: Secondary | ICD-10-CM | POA: Diagnosis not present

## 2022-04-12 DIAGNOSIS — D2261 Melanocytic nevi of right upper limb, including shoulder: Secondary | ICD-10-CM | POA: Diagnosis not present

## 2022-04-12 DIAGNOSIS — L821 Other seborrheic keratosis: Secondary | ICD-10-CM | POA: Diagnosis not present

## 2022-04-16 DIAGNOSIS — G4733 Obstructive sleep apnea (adult) (pediatric): Secondary | ICD-10-CM | POA: Diagnosis not present

## 2022-04-18 ENCOUNTER — Ambulatory Visit: Payer: BC Managed Care – PPO | Admitting: Internal Medicine

## 2022-04-21 DIAGNOSIS — E118 Type 2 diabetes mellitus with unspecified complications: Secondary | ICD-10-CM | POA: Diagnosis not present

## 2022-04-21 DIAGNOSIS — R0602 Shortness of breath: Secondary | ICD-10-CM | POA: Diagnosis not present

## 2022-04-21 DIAGNOSIS — R0683 Snoring: Secondary | ICD-10-CM | POA: Diagnosis not present

## 2022-04-21 DIAGNOSIS — L03115 Cellulitis of right lower limb: Secondary | ICD-10-CM | POA: Diagnosis not present

## 2022-04-29 ENCOUNTER — Other Ambulatory Visit: Payer: Self-pay | Admitting: Psychiatry

## 2022-04-29 DIAGNOSIS — F5105 Insomnia due to other mental disorder: Secondary | ICD-10-CM

## 2022-05-09 DIAGNOSIS — E1169 Type 2 diabetes mellitus with other specified complication: Secondary | ICD-10-CM | POA: Diagnosis not present

## 2022-05-09 DIAGNOSIS — E7800 Pure hypercholesterolemia, unspecified: Secondary | ICD-10-CM | POA: Diagnosis not present

## 2022-05-11 NOTE — Patient Instructions (Addendum)
____________________________________________________________________________________________  General Risks and Possible Complications  Patient Responsibilities: It is important that you read this as it is part of your informed consent. It is our duty to inform you of the risks and possible complications associated with treatments offered to you. It is your responsibility as a patient to read this and to ask questions about anything that is not clear or that you believe was not covered in this document.  Patient's Rights: You have the right to refuse treatment. You also have the right to change your mind, even after initially having agreed to have the treatment done. However, under this last option, if you wait until the last second to change your mind, you may be charged for the materials used up to that point.  Introduction: Medicine is not an Chief Strategy Officer. Everything in Medicine, including the lack of treatment(s), carries the potential for danger, harm, or loss (which is by definition: Risk). In Medicine, a complication is a secondary problem, condition, or disease that can aggravate an already existing one. All treatments carry the risk of possible complications. The fact that a side effects or complications occurs, does not imply that the treatment was conducted incorrectly. It must be clearly understood that these can happen even when everything is done following the highest safety standards.  No treatment: You can choose not to proceed with the proposed treatment alternative. The "PRO(s)" would include: avoiding the risk of complications associated with the therapy. The "CON(s)" would include: not getting any of the treatment benefits. These benefits fall under one of three categories: diagnostic; therapeutic; and/or palliative. Diagnostic benefits include: getting information which can ultimately lead to improvement of the disease or symptom(s). Therapeutic benefits are those associated with the  successful treatment of the disease. Finally, palliative benefits are those related to the decrease of the primary symptoms, without necessarily curing the condition (example: decreasing the pain from a flare-up of a chronic condition, such as incurable terminal cancer).  General Risks and Complications: These are associated to most interventional treatments. They can occur alone, or in combination. They fall under one of the following six (6) categories: no benefit or worsening of symptoms; bleeding; infection; nerve damage; allergic reactions; and/or death. No benefits or worsening of symptoms: In Medicine there are no guarantees, only probabilities. No healthcare provider can ever guarantee that a medical treatment will work, they can only state the probability that it may. Furthermore, there is always the possibility that the condition may worsen, either directly, or indirectly, as a consequence of the treatment. Bleeding: This is more common if the patient is taking a blood thinner, either prescription or over the counter (example: Goody Powders, Fish oil, Aspirin, Garlic, etc.), or if suffering a condition associated with impaired coagulation (example: Hemophilia, cirrhosis of the liver, low platelet counts, etc.). However, even if you do not have one on these, it can still happen. If you have any of these conditions, or take one of these drugs, make sure to notify your treating physician. Infection: This is more common in patients with a compromised immune system, either due to disease (example: diabetes, cancer, human immunodeficiency virus [HIV], etc.), or due to medications or treatments (example: therapies used to treat cancer and rheumatological diseases). However, even if you do not have one on these, it can still happen. If you have any of these conditions, or take one of these drugs, make sure to notify your treating physician. Nerve Damage: This is more common when the treatment is an invasive  one, but it can also happen with the use of medications, such as those used in the treatment of cancer. The damage can occur to small secondary nerves, or to large primary ones, such as those in the spinal cord and brain. This damage may be temporary or permanent and it may lead to impairments that can range from temporary numbness to permanent paralysis and/or brain death. Allergic Reactions: Any time a substance or material comes in contact with our body, there is the possibility of an allergic reaction. These can range from a mild skin rash (contact dermatitis) to a severe systemic reaction (anaphylactic reaction), which can result in death. Death: In general, any medical intervention can result in death, most of the time due to an unforeseen complication. ____________________________________________________________________________________________ Facet Blocks Patient Information  Description: The facets are joints in the spine between the vertebrae.  Like any joints in the body, facets can become irritated and painful.  Arthritis can also effect the facets.  By injecting steroids and local anesthetic in and around these joints, we can temporarily block the nerve supply to them.  Steroids act directly on irritated nerves and tissues to reduce selling and inflammation which often leads to decreased pain.  Facet blocks may be done anywhere along the spine from the neck to the low back depending upon the location of your pain.   After numbing the skin with local anesthetic (like Novocaine), a small needle is passed onto the facet joints under x-ray guidance.  You may experience a sensation of pressure while this is being done.  The entire block usually lasts about 15-25 minutes.   Conditions which may be treated by facet blocks:  Low back/buttock pain Neck/shoulder pain Certain types of headaches  Preparation for the injection:  Do not eat any solid food or dairy products within 8 hours of your  appointment. You may drink clear liquid up to 3 hours before appointment.  Clear liquids include water, black coffee, juice or soda.  No milk or cream please. You may take your regular medication, including pain medications, with a sip of water before your appointment.  Diabetics should hold regular insulin (if taken separately) and take 1/2 normal NPH dose the morning of the procedure.  Carry some sugar containing items with you to your appointment. A driver must accompany you and be prepared to drive you home after your procedure. Bring all your current medications with you. An IV may be inserted and sedation may be given at the discretion of the physician. A blood pressure cuff, EKG and other monitors will often be applied during the procedure.  Some patients may need to have extra oxygen administered for a short period. You will be asked to provide medical information, including your allergies and medications, prior to the procedure.  We must know immediately if you are taking blood thinners (like Coumadin/Warfarin) or if you are allergic to IV iodine contrast (dye).  We must know if you could possible be pregnant.  Possible side-effects:  Bleeding from needle site Infection (rare, may require surgery) Nerve injury (rare) Numbness & tingling (temporary) Difficulty urinating (rare, temporary) Spinal headache (a headache worse with upright posture) Light-headedness (temporary) Pain at injection site (serveral days) Decreased blood pressure (rare, temporary) Weakness in arm/leg (temporary) Pressure sensation in back/neck (temporary)   Call if you experience:  Fever/chills associated with headache or increased back/neck pain Headache worsened by an upright position New onset, weakness or numbness of an extremity below the injection site Hives or  difficulty breathing (go to the emergency room) Inflammation or drainage at the injection site(s) Severe back/neck pain greater than  usual New symptoms which are concerning to you  Please note:  Although the local anesthetic injected can often make your back or neck feel good for several hours after the injection, the pain will likely return. It takes 3-7 days for steroids to work.  You may not notice any pain relief for at least one week.  If effective, we will often do a series of 2-3 injections spaced 3-6 weeks apart to maximally decrease your pain.  After the initial series, you may be a candidate for a more permanent nerve block of the facets.  If you have any questions, please call [phone]) [facility]____________________________________________________________________________________________  Patient Information update  To: All of our patients.  Re: Name change.  It has been made official that our current name, "[facility]"   will soon be changed to "[facility]".   The purpose of this change is to eliminate any confusion created by the concept of our practice being a "Medication Management Pain Clinic". In the past this has led to the misconception that we treat pain primarily by the use of prescription medications.  Nothing can be farther from the truth.   Understanding PAIN MANAGEMENT: To further understand what our practice does, you first have to understand that "Pain Management" is a subspecialty that requires additional training once a physician has completed their specialty training, which can be in either Anesthesia, Neurology, Psychiatry, or Physical Medicine and Rehabilitation (PMR). Each one of these contributes to the final approach taken by each physician to the management of their patient's pain. To be a "Pain Management Specialist" you must have first completed one of the specialty trainings below.  Anesthesiologists - trained in clinical pharmacology  and interventional techniques such as nerve blockade and regional as well as central neuroanatomy. They are trained to block pain before, during, and after surgical interventions.  Neurologists - trained in the diagnosis and pharmacological treatment of complex neurological conditions, such as Multiple Sclerosis, Parkinson's, spinal cord injuries, and other systemic conditions that may be associated with symptoms that may include but are not limited to pain. They tend to rely primarily on the treatment of chronic pain using prescription medications.  Psychiatrist - trained in conditions affecting the psychosocial wellbeing of patients including but not limited to depression, anxiety, schizophrenia, personality disorders, addiction, and other substance use disorders that may be associated with chronic pain. They tend to rely primarily on the treatment of chronic pain using prescription medications.   Physical Medicine and Rehabilitation (PMR) physicians, also known as physiatrists - trained to treat a wide variety of medical conditions affecting the brain, spinal cord, nerves, bones, joints, ligaments, muscles, and tendons. Their training is primarily aimed at treating patients that have suffered injuries that have caused severe physical impairment. Their training is primarily aimed at the physical therapy and rehabilitation of those patients. They may also work alongside orthopedic surgeons or neurosurgeons using their expertise in assisting surgical patients to recover after their surgeries.  INTERVENTIONAL PAIN MANAGEMENT is sub-subspecialty of Pain Management.  Our physicians are Board-certified in Anesthesia, Pain Management, and Interventional Pain Management.  This meaning that not only have they been trained and Board-certified in their specialty of Anesthesia, and subspecialty of Pain Management, but they have also received further training in the sub-subspecialty of  Interventional Pain Management,  in order to become Board-certified as INTERVENTIONAL PAIN MANAGEMENT SPECIALIST.    Mission: Our goal is to use our skills in  [place] as alternatives to the chronic use of prescription opioid medications for the treatment of pain. To make this more clear, we have changed our name to reflect what we do and offer. We will continue to offer medication management assessment and recommendations, but we will not be taking over any patient's medication management.  ____________________________________________________________________________________________

## 2022-05-11 NOTE — Progress Notes (Unsigned)
PROVIDER NOTE: Information contained herein reflects review and annotations entered in association with encounter. Interpretation of such information and data should be left to medically-trained personnel. Information provided to patient can be located elsewhere in the medical record under "Patient Instructions". Document created using STT-dictation technology, any transcriptional errors that may result from process are unintentional.    Patient: Carl Melton  Service Category: E/M  Provider: Gaspar Cola, MD  DOB: Dec 27, 1955  DOS: 05/23/2022  Referring Provider: Kirk Ruths, MD  MRN: [id number]  Specialty: Interventional Pain Management  PCP: Kirk Ruths, MD  Type: Established Patient  Setting: Ambulatory outpatient    Location: Office  Delivery: Face-to-face     Primary Reason(s) for Visit: Encounter for evaluation before starting new chronic pain management plan of care (Level of risk: moderate) CC: No chief complaint on file.  HPI  Carl Melton is a 66 y.o. year old, male patient, who comes today for a follow-up evaluation to review the test results and decide on a treatment plan. He has Diabetes mellitus ([place]); Encounter for general adult medical examination without abnormal findings; Adult hypothyroidism; Hypertensive left ventricular hypertrophy; Morbid obesity ([place]); Steatohepatitis; Hypothyroid; DM (diabetes mellitus) ([place]); Health care maintenance; LVH (left ventricular hypertrophy) due to hypertensive disease; Pure hypercholesterolemia; OSA treated with BiPAP; Bipolar 1 disorder, mixed, moderate ([place]); Nocturnal hypoxemia; Dyspnea on exertion; Insomnia; Cervical radiculopathy; Controlled type 2 diabetes mellitus with complication, without long-term current use of insulin ([place]); Chronic pain syndrome; Pharmacologic therapy; Disorder of skeletal system; Problems influencing health status; Chronic neck pain (1ry area of Pain) (Bilateral) (R>L); History of vertigo; Vertigo of  cervical arthrosis syndrome; and Abnormal MRI, cervical spine (10/26/2021) on their problem list. His primarily concern today is the No chief complaint on file.  Pain Assessment: Location:     Radiating:   Onset:   Duration:   Quality:   Severity:  /10 (subjective, self-reported pain score)  Effect on ADL:   Timing:   Modifying factors:   BP:    HR:    Carl Melton comes in today for a follow-up visit after his initial evaluation on 03/31/2022. Today we went over the results of his tests. These were explained in "Layman's terms". During today's appointment we went over my diagnostic impression, as well as the proposed treatment plan.  Review of initial evaluation (03/31/2022): "According to the patient everything started around January 2023 when he began having symptoms of vertigo.  He was then sent to the ENT for treatment and although it did help the vertigo and it did go away, he then started having neck problems.  He refers that around July he had an injection in the neck which did improve his symptoms however the pain then returned.  Currently the patient indicates that his primary area of pain is that of the neck  (Right).  He denies any prior surgeries, but he does describe having had some x-rays and MRI of the affected area.  He also describes having had physical therapy at the [facility] where he was going approximately 2-3 times per week for approximately 6 weeks.  He refers that this did not help.  He also indicated that the nerve blocks were done at [facility].  Interestingly he denies any neck pain prior to the vertigo but does admit to occasionally having had some neck stiffness.  He denies having or having had any upper extremity pain, numbness, or weakness before the injection treatments.  According to the medical records, it would  seem that on 10/27/2021 the patient had a C7-T1 cervical epidural steroid injection followed on 12/10/2021 by a C4 cervical epidural steroid injection on  the left.  He refers that all of these treatments were for pain that he was experiencing on the left side and that seems to have gone away however he is now experiencing symptoms on the right side.  He describes having numbness over the lateral aspect of the neck on the right side going from the lower portion of the neck up to his ear were the lower half of his earlobe is numb.  He refers that this particular symptom appeared after he had the second injection however he does not think that this is related to the injections since they were done on the left side of the symptoms that he is experiencing on the right side.  Today he indicates that he was sent over by [facility] after being told that his insurance company would not cover any further treatments.  An MRI of the cervical spine done on 10/26/2021 indicates that the patient has severe left C2-3 and C4-5 neuroforaminal narrowing with severe right C3-4 and C4-5 neural foraminal narrowing.  In addition he has severe left C2-3 facet arthropathy.  There is also mild C3-4 through C6-7 spinal canal stenosis secondary to disc osteophyte complex this and uncovertebral joint hypertrophy.  In addition there is multilevel none comprehensive protrusion type disc herniations in the visualized portion of the upper thoracic spine.  Although the actual procedure notes are unavailable, there are references to an interlaminar cervical epidural steroid injection at the C7-T1 level which was apparently done on 10/27/2021.  This was followed by a left C4 selective nerve root epidural steroid injection on 12/10/2021.  In reviewing the available information, the patient indicates that his primary pain is axial with no referred pain towards the upper extremity or shoulders.  The MRI also describes multilevel uncovertebral joint hypertrophy with different levels of facet arthropathy ranging from moderate to severe with the left side repeatedly being more severe than the right.  For  this reason and seen that the above interventions were not effective, I would think that the etiology of this pain may be facetal.  For this reason, we will evaluate the available information and we will consider the possibility of a diagnostic right-sided versus bilateral cervical facet block under fluoroscopic guidance."  ***  Because interventional pain management is my board-certified specialty, the patient was informed that joining my practice means that he is open to any and all interventional therapies. I made it clear that this does not mean that they will be forced to have any procedures done. What this means is that I believe interventional therapies to be essential part of the diagnosis and proper management of chronic pain conditions. Therefore, patients not interested in these interventional alternatives will be better served under the care of a different practitioner.  In considering the treatment plan options, Carl Melton was reminded that I no longer take patients for medication management only. I asked him to let me know if he had no intention of taking advantage of the interventional therapies, so that we could make arrangements to provide this space to someone interested. I also made it clear that undergoing interventional therapies for the purpose of getting pain medications is very inappropriate on the part of a patient, and it will not be tolerated in this practice. This type of behavior would suggest true addiction and therefore it requires referral to an addiction  specialist.   Further details on both, my assessment(s), as well as the proposed treatment plan, please see below.  Controlled Substance Pharmacotherapy Assessment REMS (Risk Evaluation and Mitigation Strategy)  Opioid Analgesic: None MME/day: 0 mg/day  Pill Count: None expected due to no prior prescriptions written by our practice. No notes on file Pharmacokinetics: Liberation and absorption (onset of action):  WNL Distribution (time to peak effect): WNL Metabolism and excretion (duration of action): WNL         Pharmacodynamics: Desired effects: Analgesia: Carl Melton reports >50% benefit. Functional ability: Patient reports that medication allows him to accomplish basic ADLs Clinically meaningful improvement in function (CMIF): Sustained CMIF goals met Perceived effectiveness: Described as relatively effective, allowing for increase in activities of daily living (ADL) Undesirable effects: Side-effects or Adverse reactions: None reported Monitoring: [place] PMP: PDMP reviewed during this encounter. Online review of the past 4-monthperiod previously conducted. Not applicable at this point since we have not taken over the patient's medication management yet. List of other Serum/Urine Drug Screening Test(s):  No results found for: "AMPHSCRSER", "BARBSCRSER", "BENZOSCRSER", "COCAINSCRSER", "COCAINSCRNUR", "PCPSCRSER", "THCSCRSER", "THCU", "CANNABQUANT", "OPIATESCRSER", "OXYSCRSER", "PROPOXSCRSER", "ETH", "CBDTHCR", "D8THCCBX", "D9THCCBX" List of all UDS test(s) done:  Lab Results  Component Value Date   SUMMARY Note 03/31/2022   Last UDS on record: Summary  Date Value Ref Range Status  03/31/2022 Note  Final    Comment:    ==================================================================== Compliance Drug Analysis, Ur ==================================================================== Test                             Result       Flag       Units  Drug Present and Declared for Prescription Verification   Lorazepam                      543          EXPECTED   ng/mg creat    Source of lorazepam is a scheduled prescription medication.  Drug Absent but Declared for Prescription Verification   Risperidone                    Not Detected UNEXPECTED ==================================================================== Test                      Result    Flag   Units      Ref Range   Creatinine               30               mg/dL      >=20 ==================================================================== Declared Medications:  The flagging and interpretation on this report are based on the  following declared medications.  Unexpected results may arise from  inaccuracies in the declared medications.   **Note: The testing scope of this panel includes these medications:   Lorazepam (Ativan)  Risperidone (Risperdal)   **Note: The testing scope of this panel does not include the  following reported medications:   Dulaglutide  Felodipine (Plendil)  Levothyroxine (Synthroid)  Lisinopril (Zestril)  Lithium  Loratadine (Claritin)  Metformin  Tamsulosin (Flomax) ==================================================================== For clinical consultation, please call [phone] ====================================================================    UDS interpretation: No unexpected findings.          Medication Assessment Form: Not applicable. No opioids. Treatment compliance: Not applicable Risk Assessment Profile: Aberrant behavior: See initial evaluations. None observed or detected today  Comorbid factors increasing risk of overdose: See initial evaluation. No additional risks detected today Opioid risk tool (ORT):     03/31/2022   10:40 AM  Opioid Risk   Alcohol 0  Illegal Drugs 0  Rx Drugs 0  Alcohol 0  Illegal Drugs 0  Rx Drugs 0  Age between 16-45 years  0  History of Preadolescent Sexual Abuse 0  Psychological Disease 2  Bipolar Positive  Opioid Risk Tool Scoring 2  Opioid Risk Interpretation Low Risk    ORT Scoring interpretation table:  Score <3 = Low Risk for SUD  Score between 4-7 = Moderate Risk for SUD  Score >8 = High Risk for Opioid Abuse   Risk of substance use disorder (SUD): Low  Risk Mitigation Strategies:  Patient opioid safety counseling: No controlled substances prescribed. Patient-Prescriber Agreement (PPA): No agreement signed.   Controlled substance notification to other providers: None required. No opioid therapy.  Pharmacologic Plan: Non-opioid analgesic therapy offered. Interventional alternatives discussed.             Laboratory Chemistry Profile   Renal Lab Results  Component Value Date   BUN 16 03/31/2022   CREATININE 1.06 03/31/2022   BCR 15 03/31/2022   GFRAA 84 01/06/2020   GFRNONAA 73 01/06/2020     Electrolytes Lab Results  Component Value Date   NA 138 03/31/2022   K 4.5 03/31/2022   CL 102 03/31/2022   CALCIUM 9.7 03/31/2022   MG 2.0 03/31/2022     Hepatic Lab Results  Component Value Date   AST 8 03/31/2022   ALBUMIN 5.0 (H) 03/31/2022   ALKPHOS 57 03/31/2022     ID Lab Results  Component Value Date   SARSCOV2NAA NEGATIVE 05/07/2020     Bone Lab Results  Component Value Date   25OHVITD1 13 (L) 03/31/2022   25OHVITD2 <1.0 03/31/2022   25OHVITD3 13 03/31/2022     Endocrine Lab Results  Component Value Date   GLUCOSE 175 (H) 03/31/2022   TSH 4.120 01/06/2020     Neuropathy Lab Results  Component Value Date   VITAMINB12 297 03/31/2022     CNS No results found for: "COLORCSF", "APPEARCSF", "RBCCOUNTCSF", "WBCCSF", "POLYSCSF", "LYMPHSCSF", "EOSCSF", "PROTEINCSF", "GLUCCSF", "JCVIRUS", "CSFOLI", "IGGCSF", "LABACHR", "ACETBL"   Inflammation (CRP: Acute  ESR: Chronic) Lab Results  Component Value Date   CRP <1 03/31/2022   ESRSEDRATE 16 03/31/2022     Rheumatology No results found for: "RF", "ANA", "LABURIC", "URICUR", "LYMEIGGIGMAB", "LYMEABIGMQN", "HLAB27"   Coagulation No results found for: "INR", "LABPROT", "APTT", "PLT", "DDIMER", "LABHEMA", "VITAMINK1", "AT3"   Cardiovascular No results found for: "BNP", "CKTOTAL", "CKMB", "TROPONINI", "HGB", "HCT", "LABVMA", "EPIRU", "EPINEPH24HUR", "NOREPRU", "NOREPI24HUR", "DOPARU", "DOPAM24HRUR"   Screening Lab Results  Component Value Date   SARSCOV2NAA NEGATIVE 05/07/2020     Cancer No results found for:  "CEA", "CA125", "LABCA2"   Allergens No results found for: "ALMOND", "APPLE", "ASPARAGUS", "AVOCADO", "BANANA", "BARLEY", "BASIL", "BAYLEAF", "GREENBEAN", "LIMABEAN", "WHITEBEAN", "BEEFIGE", "REDBEET", "BLUEBERRY", "BROCCOLI", "CABBAGE", "MELON", "CARROT", "CASEIN", "CASHEWNUT", "CAULIFLOWER", "CELERY"     Note: Lab results reviewed.  Recent Diagnostic Imaging Review  Cervical DG Bending/F/E views: Results for orders placed during the hospital encounter of 03/31/22 DG Cervical Spine With Flex & Extend  Narrative CLINICAL DATA:  Cervicalgia.  EXAM: CERVICAL SPINE COMPLETE WITH FLEXION AND EXTENSION VIEWS  COMPARISON:  None Available.  FINDINGS: No evidence of fracture or subluxation of the cervical spine. Normal alignment and height of the vertebral bodies.  Multilevel osteoarthritic changes with disc space  narrowing, endplate sclerosis and osteophyte formation. Similar mild posterior facet arthropathy, particularly at C2-C3 and C3-C4. No evidence of instability with flexion or extension.  IMPRESSION: 1. Multilevel osteoarthritic changes of the cervical spine. 2. Mild posterior facet arthropathy, particularly at C2-C3 and C3-C4.   Electronically Signed By: Fidela Salisbury M.D. On: 04/02/2022 16:17  Foot Imaging: Foot-R DG Complete: Results for orders placed in visit on 04/08/20 DG Foot Complete Right  Narrative Please see detailed radiograph report in office note.  Foot-L DG Complete: Results for orders placed in visit on 04/08/20 DG Foot Complete Left  Narrative Please see detailed radiograph report in office note.  Complexity Note: Imaging results reviewed.                         Meds   Current Outpatient Medications:    Dulaglutide (TRULICITY [place]), Inject into the skin., Disp: , Rfl:    felodipine (PLENDIL) 5 MG 24 hr tablet, 5 mg daily. , Disp: , Rfl: 4   levothyroxine (SYNTHROID, LEVOTHROID) 88 MCG tablet, Take 100 mcg by mouth daily., Disp: , Rfl:  5   lisinopril (PRINIVIL,ZESTRIL) 20 MG tablet, Take 20 mg by mouth daily., Disp: , Rfl: 5   lithium carbonate (ESKALITH) 450 MG CR tablet, 1 AND 1/2 TABLETS IN THE MORNING AND 2 TABLETS AT NIGHT, Disp: 315 tablet, Rfl: 0   loratadine (CLARITIN) 10 MG tablet, Take by mouth., Disp: , Rfl:    LORazepam (ATIVAN) 0.5 MG tablet, TAKE 3 TABLETS (1.5 MG TOTAL) BY MOUTH AT BEDTIME., Disp: 90 tablet, Rfl: 1   metFORMIN (GLUCOPHAGE-XR) 500 MG 24 hr tablet, Take 1,500 mg by mouth daily., Disp: , Rfl: 5   risperiDONE (RISPERDAL) 1 MG tablet, TAKE 1 TABLET BY MOUTH EVERYDAY AT BEDTIME, Disp: 90 tablet, Rfl: 0   tamsulosin (FLOMAX) 0.4 MG CAPS capsule, Take 0.4 mg by mouth., Disp: , Rfl:   Current Facility-Administered Medications:    acetaminophen (TYLENOL) tablet 500 mg, 500 mg, Oral, Once, Ratcliffe, Heather R, PA-C  ROS  Constitutional: Denies any fever or chills Gastrointestinal: No reported hemesis, hematochezia, vomiting, or acute GI distress Musculoskeletal: Denies any acute onset joint swelling, redness, loss of ROM, or weakness Neurological: No reported episodes of acute onset apraxia, aphasia, dysarthria, agnosia, amnesia, paralysis, loss of coordination, or loss of consciousness  Allergies  Carl Melton is allergic to erythromycin ethylsuccinate and simvastatin.  PFSH  Drug: Carl Melton  reports no history of drug use. Alcohol:  reports current alcohol use of about 4.0 standard drinks of alcohol per week. Tobacco:  reports that he quit smoking about 42 years ago. His smoking use included cigarettes. He started smoking about 48 years ago. He has a 12.00 pack-year smoking history. He has never used smokeless tobacco. Medical:  has a past medical history of Bipolar 1 disorder ([place]), Depression, Diabetes mellitus without complication ([place]), Dysrhythmia, Hyperlipidemia, Hypertension, Hypothyroidism, LVH (left ventricular hypertrophy), Morbid obesity ([place]), Sleep apnea, and Steatohepatitis. Surgical:  Carl Melton  has a past surgical history that includes Colonoscopy (01/04/2013, 08/04/2009); Esophagogastroduodenoscopy (03/21/2002); Uvulopalatopharyngoplasty (1997); Knee surgery; Appendectomy; Colonoscopy with propofol (N/A, 08/06/2018); and Colonoscopy with propofol (N/A, 05/11/2020). Family: family history is not on file.  Constitutional Exam  General appearance: Well nourished, well developed, and well hydrated. In no apparent acute distress There were no vitals filed for this visit. BMI Assessment: Estimated body mass index is 43.33 kg/m as calculated from the following:   Height as of 03/31/22: _0  (  1.727 m).   Weight as of 03/31/22: 285 lb (129.3 kg).  BMI interpretation table: BMI level Category Range association with higher incidence of chronic pain  <18 kg/m2 Underweight   18.5-24.9 kg/m2 Ideal body weight   25-29.9 kg/m2 Overweight Increased incidence by 20%  30-34.9 kg/m2 Obese (Class I) Increased incidence by 68%  35-39.9 kg/m2 Severe obesity (Class II) Increased incidence by 136%  >40 kg/m2 Extreme obesity (Class III) Increased incidence by 254%   Patient's current BMI Ideal Body weight  There is no height or weight on file to calculate BMI. Patient weight not recorded   BMI Readings from Last 4 Encounters:  03/31/22 43.33 kg/m  03/29/22 41.81 kg/m  02/17/22 41.81 kg/m  01/04/22 41.05 kg/m   Wt Readings from Last 4 Encounters:  03/31/22 285 lb (129.3 kg)  03/29/22 275 lb (124.7 kg)  02/17/22 275 lb (124.7 kg)  01/04/22 270 lb (122.5 kg)    Psych/Mental status: Alert, oriented x 3 (person, place, & time)       Eyes: PERLA Respiratory: No evidence of acute respiratory distress  Assessment & Plan  Primary Diagnosis & Pertinent Problem List: The primary encounter diagnosis was Chronic pain syndrome. Diagnoses of Chronic neck pain (1ry area of Pain) (Bilateral) (R>L) and Abnormal MRI, cervical spine (10/26/2021) were also pertinent to this visit.  Visit  Diagnosis: 1. Chronic pain syndrome   2. Chronic neck pain (1ry area of Pain) (Bilateral) (R>L)   3. Abnormal MRI, cervical spine (10/26/2021)    Problems updated and reviewed during this visit: Problem  Abnormal MRI, cervical spine (10/26/2021)   (10/26/2021) CERVICAL MRI FINDINGS: (MRI done at [facility]) Alignment: Straightening of the normal cervical lordosis Soft tissue: Major arterial flow voids in the neck are preserved.  DISC LEVELS: C2-3: Disc osteophyte complex with uncovertebral joint hypertrophy.  Severe left and moderate facet arthropathy.  Severe left neuroforaminal narrowing.  Mild spinal canal stenosis. C3-4: Disc osteophyte complex with uncovertebral joint hypertrophy.  Moderate facet arthropathy.  Severe right and moderate left neuroforaminal narrowing.  Mild spinal canal stenosis. C4-5: Disc osteophyte complex with uncovertebral joint hypertrophy.  Severe left and moderate right neuroforaminal narrowing.  Mild spinal canal stenosis. C5-6: Disc osteophyte complex with uncovertebral joint hypertrophy and central protrusion type disc herniation.  Moderate neuroforaminal narrowing.  Mild spinal canal stenosis. C6-7: Disc osteophyte complex with uncovertebral joint hypertrophy.  Moderate neuroforaminal narrowing.  Mild spinal canal stenosis. C7-T1: No disc herniation or disc osteophyte complex.  No spinal canal or neuroforaminal narrowing.  Visualized portion of the upper thoracic spine: Multilevel noncompressive protrusion type disc herniations.  Conclusion:  1.  Severe left C2-3 and C4-5 neuroforaminal narrowing with severe right C3-4 and C4-5 neuroforaminal narrowing.  Severe left C2-3 facet arthropathy. 2.  Mild C3-4 through C6-7 spinal canal stenosis secondary to disc osteophyte complexes and uncovertebral joint hypertrophy. 3.  Multilevel noncompressive protrusion type disc herniations in the visualized portion of the upper thoracic spine.     Plan of Care   Pharmacotherapy (Medications Ordered): No orders of the defined types were placed in this encounter.  Procedure Orders    No procedure(s) ordered today   Lab Orders  No laboratory test(s) ordered today   Imaging Orders  No imaging studies ordered today   Referral Orders  No referral(s) requested today    Pharmacological management:  Opioid Analgesics: I will not be prescribing any opioids at this time Membrane stabilizer: I will not be prescribing any at this time Muscle relaxant: I  will not be prescribing any at this time NSAID: I will not be prescribing any at this time Other analgesic(s): I will not be prescribing any at this time     Interventional Therapies  Risk  Complexity Considerations:   Estimated body mass index is 43.33 kg/m as calculated from the following:   Height as of this encounter: _0  (1.727 m).   Weight as of this encounter: 285 lb (129.3 kg). WNL   Planned  Pending:   Pending further evaluation   Under consideration:   Diagnostic right vs bilateral cervical facet MBB #1    Completed:   None at this time   Completed by other providers:   Although the actual procedure notes are unavailable, there are references to an interlaminar cervical epidural steroid injection at the C7-T1 level which was apparently done on 10/27/2021.  This was followed by a left C4 selective nerve root epidural steroid injection on 12/10/2021.   Therapeutic  Palliative (PRN) options:   None established    Provider-requested follow-up: No follow-ups on file. Recent Visits Date Type Provider Dept  03/31/22 Office Visit Milinda Pointer, MD [facility]  Showing recent visits within past 90 days and meeting all other requirements Future Appointments Date Type Provider Dept  05/23/22 Appointment Milinda Pointer, MD [facility]  Showing future appointments within next 90 days and meeting all other requirements  Primary Care Physician: Kirk Ruths, MD Note by: Gaspar Cola, MD Date: 05/23/2022; Time: 7:36 PM

## 2022-05-17 DIAGNOSIS — R0602 Shortness of breath: Secondary | ICD-10-CM | POA: Diagnosis not present

## 2022-05-17 DIAGNOSIS — G4733 Obstructive sleep apnea (adult) (pediatric): Secondary | ICD-10-CM | POA: Diagnosis not present

## 2022-05-17 DIAGNOSIS — R0683 Snoring: Secondary | ICD-10-CM | POA: Diagnosis not present

## 2022-05-18 DIAGNOSIS — R0602 Shortness of breath: Secondary | ICD-10-CM | POA: Diagnosis not present

## 2022-05-18 DIAGNOSIS — R0683 Snoring: Secondary | ICD-10-CM | POA: Diagnosis not present

## 2022-05-20 DIAGNOSIS — E7800 Pure hypercholesterolemia, unspecified: Secondary | ICD-10-CM | POA: Diagnosis not present

## 2022-05-20 DIAGNOSIS — E1169 Type 2 diabetes mellitus with other specified complication: Secondary | ICD-10-CM | POA: Diagnosis not present

## 2022-05-22 DIAGNOSIS — R0683 Snoring: Secondary | ICD-10-CM | POA: Diagnosis not present

## 2022-05-22 DIAGNOSIS — R0602 Shortness of breath: Secondary | ICD-10-CM | POA: Diagnosis not present

## 2022-05-22 NOTE — Progress Notes (Signed)
SG, NP- History of Present Illness BLAS RICHES is a 66 y.o. male with OSA , presents to the office for a sleep consult , pt. already has a BiPAP / CPAP machine that he has used for years. He has had much worsening sleep disorder breathing over the last year. [name] BiPAP titration was ordered by Dr. Annamaria Boots. Pt. Wants to establish with [facility] for sleep.   02/16/2022 Pt. Presents for sleep consult. He has a PMH of OSA,  depression, DM II, elevated cholesterol, hypothyroid, and obesity. He already has a CPAP machine. ( Wife states it is a BiPAP, but down load showed a CPAP machine) BiPAP titration was done 12/05/2021 because the patient has been having such poor sleep over the last several months. [name] He awakens every hour on CPAP. Current settings are set pressure of 16 cm H2O. He has been managed by a neurology group in  [place] until recently. He and his wife have had difficulty getting help with his worsening sleep pattern, and have decided to come to Pulmonary for management of his severe OSA. He gets his masks and equipment on line.  He has been compliant with his device.90/90 days.  Results are as noted below. Per the split night sleep study done 12/05/2021, CPAP titration did not provide adequate control at tolerated pressure and was converted to BIPAP titration. Optimal BiPAP pressures are ( 16 / 12 cm of water), pt did experience oxygen desaturations to 79%, and oxygen was added at 2 L [place].Pt. has had CPAP  machine for about 10 years. HIs machine is > 42 years old. He has been wearing it at the setting of 16 cm pressure. ( No other settings listed on the down load which is why I think this is a CPAP device.) Down Load  of his current CPAP shows AHI of 17 on a set pressure of 16. He has excessive sleepiness requiring day time naps. He does wear his device with naps. We discussed that napping throughout the day may be one reason he cannot sleep at night.   Pt. Has not been on any oxygen with sleep. I am  concerned this may also be a reason for his poor sleep quality.  He has been evaluated by cardiology, 10/2021 for acute on chronic shortness of breath that has worsened over the last 3 months. He has HTN with stable BP controlled with medication Stress Test Exercise Stress Echo was performed showing Normal test. other than poor exercise tolerance  Down Load today: S9 VPAP Auto >> CPAP Mode  Set Pressure of 16 cm H2O Usage 90/90 days ( 100%) > 4 hours 88 days, < 4 hours 2 days Average usage 6 hours 43 minutes AHI 17   Sleep questionnaire Symptoms-  Pt. Awakens every hour despite current CPAP/ BiPAP use Prior sleep study- Yes Bedtime- 9-9:30 pm Time to fall asleep- not long, 15 minutes Nocturnal awakenings- several, at least 4-5 Out of bed/start of day- I sleep on and off al day and night Weight changes- Per wife, he has lost about 20 pounds in the last year.  Do you operate heavy machinery- NO  Do you currently wear CPAP- He states it is BiPAP machine, but Down Load looks like it is set up as  CPAP.  Do you current wear oxygen- NO [place]-  23-24 ( Scored 3 for everything except falling asleep in car while stopped in traffic)  He appears sleepy today in the office .   Test  Results: 12/05/2021: Split night Sleep Study CPAP titration did not provide adequate control at tolerated pressure and was converted to BIPAP titration. - An optimal BIPAP pressure was selected for this patient ( 16 / 12 cm of water) - Moderate Central Sleep Apnea was noted during this titration (CAI = 24.9/h). - Oxygen desaturations were observed during this titration (min O2 = 79.0%). Persistent hypoxemia noted on BIPAP 16/12 (Minimum 84%, Mean 86.6%). - Snoring was audible during this study. - Bathroom x 4. - 2-lead EKG demonstrated:  PVCs  DIAGNOSIS - Obstructive Sleep Apnea (G47.33) - Nocturnal Hypoxemia    RECOMMENDATIONS - Trial of BiPAP therapy on 16/12 cm H2O or autoBIPAP 20-10/ 20/10 with a Large  size [employer] Full Face Simplus mask and heated humidification. - Supplemental O2 during sleep- probably 2L. Evaluate for cardiopulmonary basis for hypoxem Assessment/Plan Severe OSA  Split Night sleep titration shows need for BiPAP machine An optimal BIPAP pressure was selected for this patient ( 16 / 12 cm of water) - Moderate Central Sleep Apnea was noted during this titration (CAI = 24.9/h). - Oxygen desaturations were observed during this titration (min O2 = 79.0%).  - Persistent hypoxemia noted on BIPAP 16/12 (Minimum 84%, Mean 86.6%). Plan Your Down Load shows you have 17 apnea events per hour.  Your Sleep Study shows CPAP is no longer effective , and you need BiPAP We will order a new BiPAP machine The DME per your new insurance works with  is [facility] [phone] phone [phone] fax number. Setting of Optimal BIPAP pressure was selected for this patient ( 16 / 12 cm of water) or auto BIPAP 20-10/ 20/10 with a Large size [employer]kel Full Face Simplus mask and heated humidification. Supplemental O2 during sleep- probably 2L.  We will order nocturnal oxygen as you need oxygen at 2 L with sleep, this needs to be bled into your BiPAP machine.  Continue using current CPAP machine until you get your new BiPAP machine. Add the oxygen at 2 L [place] whenever you are wearing your BiPAP if it is delivered to you. Ask the delivery agent to hook the oxygen up to the BiPAP machine . You need to run this at 2 L [place].    Continue on CPAP at bedtime. You appear to be benefiting from the treatment  Goal is to wear for at least 6 hours each night for maximal clinical benefit. Continue to work on weight loss, as the link between excess weight  and sleep apnea is well established.   Remember to establish a good bedtime routine, and work on sleep hygiene.  Limit daytime naps , avoid stimulants such as caffeine and nicotine close to bedtime, exercise daily to promote sleep  quality, avoid heavy , spicy, fried , or rich foods before bed. Ensure adequate exposure to natural light during the day,establish a relaxing bedtime routine with a pleasant sleep environment ( Bedroom between 60 and 67 degrees, turn off bright lights , TV or device screens screens , consider black out curtains or white noise machines) Do not drive if sleepy. Remember to clean mask, tubing, filter, and reservoir once weekly with soapy water.  Follow up with Dr. Annamaria Boots   In 6 weeks  or before as needed.     I spent 50 minutes dedicated to the care of this patient on the date of this encounter to include pre-visit review of records, face-to-face time with the patient discussing conditions above, post visit ordering of testing,  clinical documentation with the electronic health record, making appropriate referrals as documented, and communicating necessary information to the patient's healthcare team.  -------------------------------------------------------- CDY- 02/17/22- 66 yoM former smoker followed for OSA, Insomnia, complicated by HTN, LVH, Steatohepatitis, Hypercholesterolemia, Hypothyroid, DM, Bipolar 1, Depression, Morbid Obesity,  -lorazepam 0.5 mg x 3 hs, clonazepam 0.5 mg hs                 Wife here -note lithium, Risperdal,  Split night study 11/15/21- BIPAP 18/14, PS 4, O2 2L. Replacement machine ordered  01/26/22/ [employer] Presenting c/o to SG was of changing sleep pattern after years on BIPAP. Download compliance- 93%, AHI 5.3/ hr Body weight today-275 lbs ONOX on BIPAP 01/31/22- 6.46 minutes with O2 sat </= 88% -----Pt f/u on BIPAP, it is working well only concern is the mask seems to not want to seal. Getting approx 8-9hrs/night. Pt does state he wakes up around 12am and doesn't got back until 2am.  Pt has been feeling SOB/gasp while resting and on exertion since December 2022. Download reviewed. Sleeping better with new machine, but having mask leak. Bedtime 9P, WASO around 11-12 for an  hour, watches TV. Back to sleep until 6:30A. Naps in bed with CPAP 1-2 hrs during day. Lorazepam helped sleep. Drug store ran out so he tried clonazepam 0.5 mg- not as effective. Going to try higher dose. DOE- increased in past year, gradual. Little cough or wheeze. Some weight loss over past year. Smoked 2PPD x 3 yrs, quitting 40 yrs ago. Denies diagnosed heart/ lung disease.  05/24/22-  54 yoM former smoker followed for OSA, Insomnia, complicated by HTN, LVH, Steatohepatitis, Hypercholesterolemia, Hypothyroid, DM2, Bipolar 1, Depression, Morbid Obesity,  -lorazepam 0.5 mg x 3 hs          -note lithium, Risperdal,  Split night study 11/15/21- BIPAP 18/14, PS 4, O2 2L. Replacement machine ordered  01/26/22/ [facility]                              //?cbc to r/o anemia?// Download compliance- 100%, AHI 1.6/ hr Body weight today-270 lbs Covid vax-3 Moderna Flu vax-had PFT 05/24/22- WNL preliminary read He had quit O2 for sleep "too loud". We will reassess with ONOX on BIPAP. Some cough if supine- he relates to acid reflux. Reflux measures reviewed. CXR 02/18/22- MPRESSION: Unremarkable radiographic appearance of the chest, no explanation for symptoms.  ROS-see HPI  + = positive Constitutional:    +weight loss, night sweats, fevers, chills, fatigue, lassitude. HEENT:    headaches, difficulty swallowing, tooth/dental problems, sore throat,       sneezing, itching, ear ache, nasal congestion, post nasal drip, snoring CV:    chest pain, orthopnea, PND, swelling in lower extremities, anasarca,                dizziness, palpitations Resp:   +shortness of breath with exertion or at rest.                productive cough,   non-productive cough, coughing up of blood.              change in color of mucus.  wheezing.   Skin:    rash or lesions. GI:  No-   heartburn, indigestion, abdominal pain, nausea, vomiting, diarrhea,                 change in bowel habits, loss of appetite GU: dysuria, change in color  of urine, no urgency or frequency.   flank pain. MS:   joint pain, stiffness, decreased range of motion, back pain. Neuro-     nothing unusual Psych:  change in mood or affect.  depression or anxiety.   memory loss.  OBJ- Physical Exam General- Alert, Oriented, Affect-appropriate, Distress- none acute, +obese Skin- rash-none, lesions- none, excoriation- none Lymphadenopathy- none Head- atraumatic            Eyes- Gross vision intact, PERRLA, conjunctivae and secretions clear            Ears- Hearing, canals-normal            Nose- Clear, no-Septal dev, mucus, polyps, erosion, perforation             Throat- +S/P UPPP, Mallampati II , mucosa clear , drainage- none, tonsils- atrophic Neck- flexible , trachea midline, no stridor , thyroid nl, carotid no bruit Chest - symmetrical excursion , unlabored           Heart/CV- RRR , no murmur , no gallop  , no rub, nl s1 s2                           - JVD- none , edema- none, stasis changes- none, varices- none           Lung- + Shallow, clear to P&A, wheeze- none, cough- none , dullness-none, rub- none           Chest wall-  Abd-  Br/ Gen/ Rectal- Not done, not indicated Extrem- cyanosis- none, clubbing, none, atrophy- none, strength- nl Neuro- grossly intact to observation

## 2022-05-23 ENCOUNTER — Encounter: Payer: Self-pay | Admitting: Pain Medicine

## 2022-05-23 ENCOUNTER — Ambulatory Visit: Payer: No Typology Code available for payment source | Attending: Pain Medicine | Admitting: Pain Medicine

## 2022-05-23 VITALS — BP 148/63 | HR 90 | Temp 97.2°F | Resp 16 | Ht 68.0 in | Wt 281.0 lb

## 2022-05-23 DIAGNOSIS — G8929 Other chronic pain: Secondary | ICD-10-CM | POA: Diagnosis not present

## 2022-05-23 DIAGNOSIS — G894 Chronic pain syndrome: Secondary | ICD-10-CM | POA: Diagnosis not present

## 2022-05-23 DIAGNOSIS — R937 Abnormal findings on diagnostic imaging of other parts of musculoskeletal system: Secondary | ICD-10-CM | POA: Insufficient documentation

## 2022-05-23 DIAGNOSIS — M47812 Spondylosis without myelopathy or radiculopathy, cervical region: Secondary | ICD-10-CM | POA: Insufficient documentation

## 2022-05-23 DIAGNOSIS — M4802 Spinal stenosis, cervical region: Secondary | ICD-10-CM | POA: Insufficient documentation

## 2022-05-23 DIAGNOSIS — M542 Cervicalgia: Secondary | ICD-10-CM | POA: Insufficient documentation

## 2022-05-24 ENCOUNTER — Ambulatory Visit (INDEPENDENT_AMBULATORY_CARE_PROVIDER_SITE_OTHER): Payer: No Typology Code available for payment source | Admitting: Internal Medicine

## 2022-05-24 ENCOUNTER — Encounter: Payer: Self-pay | Admitting: Internal Medicine

## 2022-05-24 VITALS — BP 132/72 | HR 99 | Ht 68.0 in | Wt 270.2 lb

## 2022-05-24 DIAGNOSIS — G4733 Obstructive sleep apnea (adult) (pediatric): Secondary | ICD-10-CM

## 2022-05-24 DIAGNOSIS — G4734 Idiopathic sleep related nonobstructive alveolar hypoventilation: Secondary | ICD-10-CM

## 2022-05-24 DIAGNOSIS — R0609 Other forms of dyspnea: Secondary | ICD-10-CM

## 2022-05-24 LAB — PULMONARY FUNCTION TEST
DL/VA % pred: 126 %
DL/VA: 5.26 ml/min/mmHg/L
DLCO cor % pred: 114 %
DLCO cor: 28.72 ml/min/mmHg
DLCO unc % pred: 114 %
DLCO unc: 28.72 ml/min/mmHg
FEF 25-75 Post: 4.1 L/sec
FEF 25-75 Pre: 3.74 L/sec
FEF2575-%Change-Post: 9 %
FEF2575-%Pred-Post: 161 %
FEF2575-%Pred-Pre: 147 %
FEV1-%Change-Post: 2 %
FEV1-%Pred-Post: 96 %
FEV1-%Pred-Pre: 94 %
FEV1-Post: 3.07 L
FEV1-Pre: 3 L
FEV1FVC-%Change-Post: 0 %
FEV1FVC-%Pred-Pre: 113 %
FEV6-%Change-Post: 1 %
FEV6-%Pred-Post: 88 %
FEV6-%Pred-Pre: 87 %
FEV6-Post: 3.59 L
FEV6-Pre: 3.53 L
FEV6FVC-%Change-Post: 0 %
FEV6FVC-%Pred-Post: 105 %
FEV6FVC-%Pred-Pre: 105 %
FVC-%Change-Post: 1 %
FVC-%Pred-Post: 83 %
FVC-%Pred-Pre: 82 %
FVC-Post: 3.59 L
FVC-Pre: 3.53 L
Post FEV1/FVC ratio: 86 %
Post FEV6/FVC ratio: 100 %
Pre FEV1/FVC ratio: 85 %
Pre FEV6/FVC Ratio: 100 %
RV % pred: 137 %
RV: 3.07 L
TLC % pred: 104 %
TLC: 6.88 L

## 2022-05-24 NOTE — Progress Notes (Signed)
Full PFT Performed Today  

## 2022-05-24 NOTE — Patient Instructions (Signed)
Order- schedule overnight oximetry on BIPAP/ room air     dx nocturnal hypoxemia  Try raising the head end of the bed by putting a brick under each of the head legs. This can help keep stomach juice from refluxing up when you lie down. Hopefully it weill help the cough.

## 2022-05-24 NOTE — Patient Instructions (Signed)
Full PFT Performed Today  

## 2022-06-01 ENCOUNTER — Other Ambulatory Visit: Payer: Self-pay | Admitting: Psychiatry

## 2022-06-01 DIAGNOSIS — H9122 Sudden idiopathic hearing loss, left ear: Secondary | ICD-10-CM | POA: Diagnosis not present

## 2022-06-01 DIAGNOSIS — F319 Bipolar disorder, unspecified: Secondary | ICD-10-CM

## 2022-06-01 DIAGNOSIS — H90A22 Sensorineural hearing loss, unilateral, left ear, with restricted hearing on the contralateral side: Secondary | ICD-10-CM | POA: Diagnosis not present

## 2022-06-07 ENCOUNTER — Encounter: Payer: Self-pay | Admitting: Internal Medicine

## 2022-06-07 NOTE — Assessment & Plan Note (Signed)
Need to reassess Plan- overnight oximetry on BIPAP with room air

## 2022-06-07 NOTE — Assessment & Plan Note (Signed)
Benefits from BIPAP with good compliance and control Plan-continue BIPAP 18/14

## 2022-06-14 ENCOUNTER — Telehealth: Payer: Self-pay | Admitting: Internal Medicine

## 2022-06-14 DIAGNOSIS — G4734 Idiopathic sleep related nonobstructive alveolar hypoventilation: Secondary | ICD-10-CM

## 2022-06-15 NOTE — Telephone Encounter (Signed)
Looks like the ONO was ordered and then cancelled for some reason  I have replaced the order for ONO on BIPAP Called the pt and there was no answer- LMTCB

## 2022-06-15 NOTE — Telephone Encounter (Signed)
Spoke with Caren Griffins (pt's wife per [facility]) who states that pt has gone back to using O2 at night. Caren Griffins is requesting order for adapter piece that allows O2 to be bled into C-Pap. Caren Griffins stated she would let us know if pt needed this part. Nothing further needed at this time.

## 2022-06-16 DIAGNOSIS — G4733 Obstructive sleep apnea (adult) (pediatric): Secondary | ICD-10-CM | POA: Diagnosis not present

## 2022-06-20 DIAGNOSIS — E118 Type 2 diabetes mellitus with unspecified complications: Secondary | ICD-10-CM | POA: Diagnosis not present

## 2022-06-21 ENCOUNTER — Ambulatory Visit: Payer: No Typology Code available for payment source | Attending: Pain Medicine | Admitting: Pain Medicine

## 2022-06-21 ENCOUNTER — Ambulatory Visit
Admission: RE | Admit: 2022-06-21 | Discharge: 2022-06-21 | Disposition: A | Discharge status: Home / Self Care | Payer: No Typology Code available for payment source | Source: Ambulatory Visit | Attending: Pain Medicine | Admitting: Pain Medicine

## 2022-06-21 ENCOUNTER — Encounter: Payer: Self-pay | Admitting: Pain Medicine

## 2022-06-21 VITALS — BP 114/69 | HR 94 | Temp 96.8°F | Resp 16 | Ht 68.0 in | Wt 270.0 lb

## 2022-06-21 DIAGNOSIS — M47812 Spondylosis without myelopathy or radiculopathy, cervical region: Secondary | ICD-10-CM | POA: Diagnosis not present

## 2022-06-21 DIAGNOSIS — G8929 Other chronic pain: Secondary | ICD-10-CM | POA: Diagnosis not present

## 2022-06-21 DIAGNOSIS — R937 Abnormal findings on diagnostic imaging of other parts of musculoskeletal system: Secondary | ICD-10-CM | POA: Diagnosis not present

## 2022-06-21 DIAGNOSIS — R0602 Shortness of breath: Secondary | ICD-10-CM | POA: Diagnosis not present

## 2022-06-21 DIAGNOSIS — M542 Cervicalgia: Secondary | ICD-10-CM | POA: Diagnosis not present

## 2022-06-21 DIAGNOSIS — G894 Chronic pain syndrome: Secondary | ICD-10-CM | POA: Insufficient documentation

## 2022-06-21 DIAGNOSIS — R0683 Snoring: Secondary | ICD-10-CM | POA: Diagnosis not present

## 2022-06-21 MED ORDER — PENTAFLUOROPROP-TETRAFLUOROETH EX AERO
INHALATION_SPRAY | Freq: Once | CUTANEOUS | Status: DC
  Filled 2022-06-21: qty 116

## 2022-06-21 MED ORDER — LIDOCAINE HCL 2 % IJ SOLN
20.0000 mL | Freq: Once | INTRAMUSCULAR | Status: AC
  Administered 2022-06-21: 400 mg

## 2022-06-21 MED ORDER — DEXAMETHASONE SODIUM PHOSPHATE 10 MG/ML IJ SOLN
INTRAMUSCULAR | Status: AC
  Filled 2022-06-21: qty 1

## 2022-06-21 MED ORDER — ROPIVACAINE HCL 2 MG/ML IJ SOLN
9.0000 mL | Freq: Once | INTRAMUSCULAR | Status: AC
  Administered 2022-06-21: 9 mL via PERINEURAL

## 2022-06-21 MED ORDER — ROPIVACAINE HCL 2 MG/ML IJ SOLN
INTRAMUSCULAR | Status: AC
  Filled 2022-06-21: qty 20

## 2022-06-21 MED ORDER — LACTATED RINGERS IV SOLN: Freq: Once | INTRAVENOUS | Status: AC

## 2022-06-21 MED ORDER — LIDOCAINE HCL 2 % IJ SOLN
INTRAMUSCULAR | Status: AC
  Filled 2022-06-21: qty 20

## 2022-06-21 MED ORDER — DEXAMETHASONE SODIUM PHOSPHATE 10 MG/ML IJ SOLN
10.0000 mg | Freq: Once | INTRAMUSCULAR | Status: AC
  Administered 2022-06-21: 10 mg

## 2022-06-21 MED ORDER — MIDAZOLAM HCL 5 MG/5ML IJ SOLN
INTRAMUSCULAR | Status: AC
  Filled 2022-06-21: qty 5

## 2022-06-21 MED ORDER — FENTANYL CITRATE (PF) 100 MCG/2ML IJ SOLN
INTRAMUSCULAR | Status: AC
  Filled 2022-06-21: qty 2

## 2022-06-21 NOTE — Progress Notes (Signed)
PROVIDER NOTE: Interpretation of information contained herein should be left to medically-trained personnel. Specific patient instructions are provided elsewhere under "Patient Instructions" section of medical record. This document was created in part using STT-dictation technology, any transcriptional errors that may result from this process are unintentional.  Patient: Carl Melton Type: Established DOB: 05-23-1956 MRN: [id number] PCP: Kirk Ruths, MD  Service: Procedure DOS: 06/21/2022 Setting: Ambulatory Location: Ambulatory outpatient facility Delivery: Face-to-face Provider: Gaspar Cola, MD Specialty: Interventional Pain Management Specialty designation: 09 Location: Outpatient facility Ref. Prov.: Milinda Pointer, MD     Procedure:           Type: Cervical Facet Medial Branch Block(s) #1  Laterality: Right  Level: TON, C3, & C4  Medial Branch Level(s). Injecting these levels blocks the C2-3 & C3-4 cervical facet joints.  Imaging: Fluoroscopic guidance Anesthesia: Local anesthesia (1-2% Lidocaine) Anxiolysis: None.  The patient did not follow the N.P.O. orders. Sedation: No Sedation                       DOS: 06/21/2022  Performed by: Gaspar Cola, MD  Purpose: Diagnostic/Therapeutic Indications: Cervicalgia (cervical spine axial pain) severe enough to impact quality of life or function. 1. Cervical facet syndrome   2. Cervical facet arthropathy (Multilevel) (Bilateral)   3. Chronic neck pain (1ry area of Pain) (Bilateral) (R>L)   4. Spondylosis without myelopathy or radiculopathy, cervical region   5. Abnormal MRI, cervical spine (10/26/2021)    NAS-11 Pain score:   Pre-procedure: 8 /10   Post-procedure: 0-No pain/10   On the open 05/23/2022 I personally informed the patient on how to prepare for the procedure and I provided him with written information about it on the after visit summary (AVS).  The patient did not read any of the material in  today he came in having had breakfast.  The patient was given the choice of rescheduling or doing the procedure with no sedation.  He chose to have it done with no sedation.      Position / Prep / Materials:  Position: Prone. Head in cradle. C-spine slightly flexed. Prep solution: DuraPrep (Iodine Povacrylex [0.7% available iodine] and Isopropyl Alcohol, 74% w/w) Prep Area: Posterior Cervico-thoracic Region. From occipital ridge to tip of scapula, and from shoulder to shoulder. Entire posterior and lateral neck surface. Materials:  Tray: Block Needle(s):  Type: Spinal  Gauge (G): 22"  Length: 3.5-in  Qty: 5  Pre-op H&P Assessment:  Carl Melton is a 66 y.o. (year old), male patient, seen today for interventional treatment. He  has a past surgical history that includes Colonoscopy (01/04/2013, 08/04/2009); Esophagogastroduodenoscopy (03/21/2002); Uvulopalatopharyngoplasty (1997); Knee surgery; Appendectomy; Colonoscopy with propofol (N/A, 08/06/2018); and Colonoscopy with propofol (N/A, 05/11/2020). Carl Melton has a current medication list which includes the following prescription(s): dulaglutide, felodipine, levothyroxine, lisinopril, lithium carbonate, loratadine, lorazepam, metformin, methocarbamol, risperidone, tamsulosin, and xigduo xr, and the following Facility-Administered Medications: acetaminophen and pentafluoroprop-tetrafluoroeth. His primarily concern today is the Neck Pain  Initial Vital Signs:  Pulse/HCG Rate: 94ECG Heart Rate: 97 Temp: (!) 96.8 F (36 C) Resp: 18 BP: (!) 110/57 SpO2: 97 %  BMI: Estimated body mass index is 41.05 kg/m as calculated from the following:   Height as of this encounter: 5' 8"  (1.727 m).   Weight as of this encounter: 270 lb (122.5 kg).  Risk Assessment: Allergies: Reviewed. He is allergic to erythromycin ethylsuccinate and simvastatin.  Allergy Precautions: None required Coagulopathies: Reviewed. None identified.  Blood-thinner  therapy: None  at this time Active Infection(s): Reviewed. None identified. Carl Melton is afebrile  Site Confirmation: Carl Melton was asked to confirm the procedure and laterality before marking the site Procedure checklist: Completed Consent: Before the procedure and under the influence of no sedative(s), amnesic(s), or anxiolytics, the patient was informed of the treatment options, risks and possible complications. To fulfill our ethical and legal obligations, as recommended by the American Medical Association's Code of Ethics, I have informed the patient of my clinical impression; the nature and purpose of the treatment or procedure; the risks, benefits, and possible complications of the intervention; the alternatives, including doing nothing; the risk(s) and benefit(s) of the alternative treatment(s) or procedure(s); and the risk(s) and benefit(s) of doing nothing. The patient was provided information about the general risks and possible complications associated with the procedure. These may include, but are not limited to: failure to achieve desired goals, infection, bleeding, organ or nerve damage, allergic reactions, paralysis, and death. In addition, the patient was informed of those risks and complications associated to Spine-related procedures, such as failure to decrease pain; infection (i.e.: Meningitis, epidural or intraspinal abscess); bleeding (i.e.: epidural hematoma, subarachnoid hemorrhage, or any other type of intraspinal or peri-dural bleeding); organ or nerve damage (i.e.: Any type of peripheral nerve, nerve root, or spinal cord injury) with subsequent damage to sensory, motor, and/or autonomic systems, resulting in permanent pain, numbness, and/or weakness of one or several areas of the body; allergic reactions; (i.e.: anaphylactic reaction); and/or death. Furthermore, the patient was informed of those risks and complications associated with the medications. These include, but are not limited to:  allergic reactions (i.e.: anaphylactic or anaphylactoid reaction(s)); adrenal axis suppression; blood sugar elevation that in diabetics may result in ketoacidosis or comma; water retention that in patients with history of congestive heart failure may result in shortness of breath, pulmonary edema, and decompensation with resultant heart failure; weight gain; swelling or edema; medication-induced neural toxicity; particulate matter embolism and blood vessel occlusion with resultant organ, and/or nervous system infarction; and/or aseptic necrosis of one or more joints. Finally, the patient was informed that Medicine is not an exact science; therefore, there is also the possibility of unforeseen or unpredictable risks and/or possible complications that may result in a catastrophic outcome. The patient indicated having understood very clearly. We have given the patient no guarantees and we have made no promises. Enough time was given to the patient to ask questions, all of which were answered to the patient's satisfaction. Mr. Warf has indicated that he wanted to continue with the procedure. Attestation: I, the ordering provider, attest that I have discussed with the patient the benefits, risks, side-effects, alternatives, likelihood of achieving goals, and potential problems during recovery for the procedure that I have provided informed consent. Date  Time: 06/21/2022  8:32 AM  Pre-Procedure Preparation:  Monitoring: As per clinic protocol. Respiration, ETCO2, SpO2, BP, heart rate and rhythm monitor placed and checked for adequate function Safety Precautions: Patient was assessed for positional comfort and pressure points before starting the procedure. Time-out: I initiated and conducted the "Time-out" before starting the procedure, as per protocol. The patient was asked to participate by confirming the accuracy of the "Time Out" information. Verification of the correct person, site, and procedure were  performed and confirmed by me, the nursing staff, and the patient. "Time-out" conducted as per Joint Commission's Universal Protocol (UP.01.01.01). Time: 0906  Description/Narrative of Procedure:          Laterality: Right Targeted  Levels:  C3, C4, C5, C6, & C7 Medial Branch Level(s).  Rationale (medical necessity): procedure needed and proper for the diagnosis and/or treatment of the patient's medical symptoms and needs. Procedural Technique Safety Precautions: Aspiration looking for blood return was conducted prior to all injections. At no point did we inject any substances, as a needle was being advanced. No attempts were made at seeking any paresthesias. Safe injection practices and needle disposal techniques used. Medications properly checked for expiration dates. SDV (single dose vial) medications used. Description of the Procedure: Protocol guidelines were followed. The patient was assisted into a comfortable position. The target area was identified and the area prepped in the usual manner. Skin & deeper tissues infiltrated with local anesthetic. Appropriate amount of time allowed to pass for local anesthetics to take effect. The procedure needles were then advanced to the target area. Proper needle placement secured. Negative aspiration confirmed. Solution injected in intermittent fashion, asking for systemic symptoms every 0.5cc of injectate. The needles were then removed and the area cleansed, making sure to leave some of the prepping solution back to take advantage of its long term bactericidal properties.  Technical description of process:  TON (Third occipital nerve) Block: The target area for the TON is the postero-lateral area over the C2-3 facet joint. Under fluoroscopic guidance, a Quincke needle was inserted until contact was made with os over the postero-lateral aspect of the C2-3 facet joint (target area). After negative aspiration for blood, 0.5 mL of the nerve block solution was  injected without difficulty or complication. The needle was removed intact. C3 Medial Branch Nerve Block (MBB): The target area for the C3 dorsal medial articular branch is the lateral concave waist of the articular pillar of C3. Under fluoroscopic guidance, a Quincke needle was inserted until contact was made with os over the postero-lateral aspect of the articular pillar of C3 (target area). After negative aspiration for blood, 0.5 mL of the nerve block solution was injected without difficulty or complication. The needle was removed intact. C4 Medial Branch Nerve Block (MBB): The target area for the C4 dorsal medial articular branch is the lateral concave waist of the articular pillar of C4. Under fluoroscopic guidance, a Quincke needle was inserted until contact was made with os over the postero-lateral aspect of the articular pillar of C4 (target area). After negative aspiration for blood, 0.5 mL of the nerve block solution was injected without difficulty or complication. The needle was removed intact.  Once the entire procedure was completed, the treated area was cleaned, making sure to leave some of the prepping solution back to take advantage of its long term bactericidal properties.  Anatomy Reference Guide:       Vitals:   06/21/22 0908 06/21/22 0914 06/21/22 0915 06/21/22 0925  BP: 98/68  130/70 114/69  Pulse:      Resp: 18  16 16   Temp:      TempSrc:      SpO2: 95% (!) 80% 91% 94%  Weight:      Height:         Start Time: 0906 hrs. End Time:   hrs.  Imaging Guidance (Spinal):          Type of Imaging Technique: Fluoroscopy Guidance (Spinal) Indication(s): Assistance in needle guidance and placement for procedures requiring needle placement in or near specific anatomical locations not easily accessible without such assistance. Exposure Time: Please see nurses notes. Contrast: None used. Fluoroscopic Guidance: I was personally present during the use of  fluoroscopy. "Tunnel  Vision Technique" used to obtain the best possible view of the target area. Parallax error corrected before commencing the procedure. "Direction-depth-direction" technique used to introduce the needle under continuous pulsed fluoroscopy. Once target was reached, antero-posterior, oblique, and lateral fluoroscopic projection used confirm needle placement in all planes. Images permanently stored in EMR. Interpretation: No contrast injected. I personally interpreted the imaging intraoperatively. Adequate needle placement confirmed in multiple planes. Permanent images saved into the patient's record.  Post-operative Assessment:  Post-procedure Vital Signs:  Pulse/HCG Rate: 9496 Temp: (!) 96.8 F (36 C) Resp: 16 BP: 114/69 SpO2: 94 %  EBL: None  Complications: No immediate post-treatment complications observed by team, or reported by patient.  Note: The patient tolerated the entire procedure well. A repeat set of vitals were taken after the procedure and the patient was kept under observation following institutional policy, for this type of procedure. Post-procedural neurological assessment was performed, showing return to baseline, prior to discharge. The patient was provided with post-procedure discharge instructions, including a section on how to identify potential problems. Should any problems arise concerning this procedure, the patient was given instructions to immediately contact us, at any time, without hesitation. In any case, we plan to contact the patient by telephone for a follow-up status report regarding this interventional procedure.  Comments:  No additional relevant information.  Plan of Care  Orders:  Orders Placed This Encounter  Procedures   CERVICAL FACET (MEDIAL BRANCH NERVE BLOCK)     Scheduling Instructions:     Side: Right-sided     Level: C2-3 & C3-4 Facet joints (TON, C3, & C4 Medial Branch Nerves)     Sedation: Patient's choice.     Timeframe: Today    Order  Specific Question:   Where will this procedure be performed?    Answer:   [facility]   DG PA[facility] C-ARM 1-60 MIN NO REPORT    Intraoperative interpretation by procedural physician at [place].    Standing Status:   Standing    Number of Occurrences:   1    Order Specific Question:   Reason for exam:    Answer:   Assistance in needle guidance and placement for procedures requiring needle placement in or near specific anatomical locations not easily accessible without such assistance.   Informed Consent Details: Physician/Practitioner Attestation; Transcribe to consent form and obtain patient signature    Note: Always confirm laterality of pain with Mr. Hobin, before procedure. Transcribe to consent form and obtain patient signature.    Order Specific Question:   Physician/Practitioner attestation of informed consent for procedure/surgical case    Answer:   I, the physician/practitioner, attest that I have discussed with the patient the benefits, risks, side effects, alternatives, likelihood of achieving goals and potential problems during recovery for the procedure that I have provided informed consent.    Order Specific Question:   Procedure    Answer:   Right Cervical facet block under fluoroscopic guidance.    Order Specific Question:   Physician/Practitioner performing the procedure    Answer:   Jourden Gilson A. Dossie Arbour, MD    Order Specific Question:   Indication/Reason    Answer:   Chronic neck pain secondary to cervical facet syndrome   Provide equipment / supplies at bedside    Procedure tray: "Block Tray" (Disposable  single use) Skin infiltration needle: Regular 1.5-in, 25-G, (x1) Block Needle type: Spinal Amount/quantity: 3 Size: Regular (3.5-inch) Gauge: 22G    Standing Status:  Standing    Number of Occurrences:   1    Order Specific Question:   Specify    Answer:   Block Tray   Chronic Opioid Analgesic:  None MME/day: 0 mg/day   Medications  ordered for procedure: Meds ordered this encounter  Medications   lidocaine (XYLOCAINE) 2 % (with pres) injection 400 mg   pentafluoroprop-tetrafluoroeth (GEBAUERS) aerosol   lactated ringers infusion   ropivacaine (PF) 2 mg/mL (0.2%) (NAROPIN) injection 9 mL   dexamethasone (DECADRON) injection 10 mg   Medications administered: We administered lidocaine, lactated ringers, ropivacaine (PF) 2 mg/mL (0.2%), and dexamethasone.  See the medical record for exact dosing, route, and time of administration.  Follow-up plan:   Return in about 2 weeks (around 07/05/2022) for Proc-day (T,Th), (F2F), (PPE).       Interventional Therapies  Risk  Complexity Considerations:      WNL   Planned  Pending:   Diagnostic right C2-3, C3-4 Facet joints inj. #1    Under consideration:   Diagnostic right vs bilateral cervical facet MBB #1  Surgery discussed by [facility], a C3 to C4 ACDF offered.  (01/07/2022)    Completed:   None at this time   Completed by other providers:   Although the actual procedure notes are unavailable, there are references to an interlaminar cervical epidural steroid injection at the C7-T1 level which was apparently done on 10/27/2021.  This was followed by a left C4 selective nerve root epidural steroid injection on 12/10/2021.   Therapeutic  Palliative (PRN) options:   None established     Recent Visits Date Type Provider Dept  05/23/22 Office Visit Milinda Pointer, MD [facility]  03/31/22 Office Visit Milinda Pointer, MD [facility]  Showing recent visits within past 90 days and meeting all other requirements Today's Visits Date Type Provider Dept  06/21/22 Procedure visit Milinda Pointer, MD [facility]  Showing today's visits and meeting all other requirements Future Appointments Date Type Provider Dept  06/30/22 Appointment Milinda Pointer, MD [facility]  Showing future appointments within next 90 days  and meeting all other requirements  Disposition: Discharge home  Discharge (Date  Time): 06/21/2022; 0931 hrs.   Primary Care Physician: Kirk Ruths, MD Location: [facility] Note by: Gaspar Cola, MD Date: 06/21/2022; Time: 10:50 AM  Disclaimer:  Medicine is not an Chief Strategy Officer. The only guarantee in medicine is that nothing is guaranteed. It is important to note that the decision to proceed with this intervention was based on the information collected from the patient. The Data and conclusions were drawn from the patient's questionnaire, the interview, and the physical examination. Because the information was provided in large part by the patient, it cannot be guaranteed that it has not been purposely or unconsciously manipulated. Every effort has been made to obtain as much relevant data as possible for this evaluation. It is important to note that the conclusions that lead to this procedure are derived in large part from the available data. Always take into account that the treatment will also be dependent on availability of resources and existing treatment guidelines, considered by other Pain Management Practitioners as being common knowledge and practice, at the time of the intervention. For Medico-Legal purposes, it is also important to point out that variation in procedural techniques and pharmacological choices are the acceptable norm. The indications, contraindications, technique, and results of the above procedure should only be interpreted and judged by a  Board-Certified Interventional Pain Specialist with extensive familiarity and expertise in the same exact procedure and technique.

## 2022-06-21 NOTE — Patient Instructions (Signed)

## 2022-06-22 ENCOUNTER — Encounter: Payer: Self-pay | Admitting: Psychiatry

## 2022-06-22 ENCOUNTER — Telehealth: Payer: Self-pay | Admitting: Pain Medicine

## 2022-06-22 ENCOUNTER — Ambulatory Visit: Payer: No Typology Code available for payment source | Admitting: Psychiatry

## 2022-06-22 ENCOUNTER — Telehealth: Payer: Self-pay

## 2022-06-22 DIAGNOSIS — G20C Parkinsonism, unspecified: Secondary | ICD-10-CM

## 2022-06-22 DIAGNOSIS — F5105 Insomnia due to other mental disorder: Secondary | ICD-10-CM

## 2022-06-22 DIAGNOSIS — E538 Deficiency of other specified B group vitamins: Secondary | ICD-10-CM

## 2022-06-22 DIAGNOSIS — F319 Bipolar disorder, unspecified: Secondary | ICD-10-CM

## 2022-06-22 DIAGNOSIS — G4734 Idiopathic sleep related nonobstructive alveolar hypoventilation: Secondary | ICD-10-CM

## 2022-06-22 DIAGNOSIS — G4733 Obstructive sleep apnea (adult) (pediatric): Secondary | ICD-10-CM

## 2022-06-22 NOTE — Progress Notes (Signed)
Carl Melton [id number] Jul 18, 1955 66 y.o.  Subjective:   Patient ID:  Carl Melton is a 66 y.o. (DOB 1956-06-18) male.  Chief Complaint:  Chief Complaint  Patient presents with   Follow-up    Bipolar I disorder ([place])   Sleeping Problem    Severe OSA partially treated    HPI Carl Melton presents to the office today for follow-up of bipolar disorder and sleep.    seen December 2020.  No meds were changed.  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    01/06/2020 appointment with the following noted: Continues lithium 450 mg 3 and 1/2 tablets daily, lorazepam 1.5 mg HS, Risperdal 1 mg HS. Vaccinated. OK overall.  Doing job well with good reports but getting harder to manage multiple pieces.  Want to work 2 more years after this one.  Has good assistants.  Has good rapport with coworkers.  Mood been OK.  Only thing he notices is tremor with fine motor things at times.  OK typing.  Done well with work.  Has been able to keep going forward.  Would like to retire sooner than 66 but not sure.  Would stay busy with retirement.   Sleep is pretty good and naps after work.  OK during the day.  Has to get up anyway bc nocturia and now having trouble going back too sleep for the last 3-4 mos. 2 nights out of the week. Most of the time feeling good.  No significant mood swings No unusual fear.  Sleep is better witn increase in lorazepam without awakening.  Active.  Patient reports stable mood and denies depressed.  Patient denies difficulty with sleep initiation. Denies appetite disturbance.  Patient reports that energy and motivation have been good.  Patient denies any difficulty with concentration.  Patient denies any suicidal ideation.  Physical health stable. Plan; check lithium level. No med changes  07/07/2020 appointment with the following noted:  Still undecided about retirement but probably a couple of years more.  Job is  going OK. Occ a little agitated but not manic and no problems with it.  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  More of it in the last year, bc of a couple of key people leaving but it's better now. No mood swings.   Not sleeping great.  DM is a problem causing nocturia 4-5 times nightly. No sig SE lithium. Patient reports stable mood and denies depressed or irritable moods.  Patient denies any recent difficulty with anxiety. Denies appetite disturbance.  Patient reports that energy and motivation have been good.  Patient denies any difficulty with concentration.  Patient denies any suicidal ideation. Plan: for nocutria switch lithium to 900 mg AM and  1 and 1/2 of 450 mg tablets at night.  12/30/2020 appointment with the following noted: Fine with meds but some trouble sleeping but thinks it's stress.  [place] and only up once to urinate.  Home up 2-3 times nightly. Wonders if he has beginning PD with tremor and slower and stiffer. Worries too much.  Patient reports stable mood and denies depressed or irritable moods.  Patient denies any recent difficulty with anxiety.  Denies appetite disturbance.  Patient reports that energy and motivation have been good.  Patient denies any difficulty with concentration.  Patient denies any suicidal ideation. Plan:  No change indicated except for nocutria switch lithium to 900 mg AM  and  1 and 1/2 of 450 mg tablets at night. Disc diagnosing PD vs EPS from risperidone.  Disc risk of lowering the dose.  Reduce risperidone to 1/2 of 1 mg HS to see if Parkinsonism is better  03/29/21 appt noted: For 2 weeks on lower risperidone OK and then depressed.  Increased to 1 mg HS and in 2 days he felt better. Tremor, slowness did not improve when reduced risperidone. Saw PCP thinks it's not PD Postional vertigo with head movment.  Facial paresthesias. Nocturia unchanged.  Asks about meds for it. No mood swings.   Patient reports stable  mood and denies depressed or irritable moods.  Patient denies any recent difficulty with anxiety.  Patient denies difficulty with sleep initiation or maintenance. Denies appetite disturbance.  Patient reports that energy and motivation have been good.  Patient denies any difficulty with concentration.  Patient denies any suicidal ideation. Tremor unchanged. Plan: Check lithium level Check B12 for paresthesias. Continue lithium 900 mg every morning and 1-1/2 of the 450 mg tablets at night Continue risperidone 1 mg nightly  08/31/21 appt .   Pain in neck interfering.  On 3rd doc now.  Pending MRI.  Aggrivatiing. PT didn't help nor chiropracter.  Seeing ortho now. Sleeping problem.  To bed 830 and awake 0200 and back to bed after 30 min. Mood is ok.  No fear nor anxiety. Some daytime drowsiness.  Checking into new CPAP 12/17/21 will retire. [place]aan: DC lorazepam  Alprazolam 0.5 mg HS Reduced risperidone to 1/2 of 1 mg HS to see if Parkinsonism is better failed but more depressed. Therefore continue risperidone 34m HS  12/13/21 TC:  getting new bipap machine and on 2 liters O2 at night and appt with pulm Dr. YJanee Mornoffice 01/04/22 and 02/19/22  06/22/22 appt noted: FU with pulm 05/24/22 Dr. YAnnamaria Bootswith further rec changes including O2 at night and get a new Bipap machine. Not using O2 at night bc noise of the machine.  [name] fusses at him for not using.   Psych meds: lithium 450 mg 1 and 1/2 tablets HS and 2 AM, risperidone 1 mg HS, lorazepam 0.5  mg 3 tabs HS Effort to try different sleep meds Xanax and clonazepam failed.  Lorazepam lasts about 3 hours.   6-7 hours of broken sleep and thinks it is the bladder need to urinate. Mood has been ok overall except about sleep. More easily anxious than in the past.  Past Psychiatric Medication Trials: Risperidone 1,  Geodon sedation, haloperidol, Prolixin, Trileptal, gabapentin, sertraline, Wellbutrin,  lithium, amiloride,  lorazepam,  Xanax and  clonazepam HS ineffective  He has been under our care since December 1994.  He has had psychiatric hospitalization once.  He has been stable on lithium and risperidone since about 2007  Review of Systems:  Review of Systems  Constitutional:  Positive for fatigue.  HENT:  Positive for hearing loss and tinnitus.   Cardiovascular:  Negative for chest pain.  Genitourinary:  Positive for frequency.  Musculoskeletal:  Positive for neck pain.  Neurological:  Positive for dizziness and tremors. Negative for weakness.       Balance issues Facial paresthesia  Psychiatric/Behavioral:  Positive for sleep disturbance. Negative for agitation, behavioral problems, confusion, decreased concentration, dysphoric mood, hallucinations, self-injury and suicidal ideas. The patient is not nervous/anxious and is not hyperactive.     Medications: I have reviewed the patient's current medications.  Current Outpatient Medications  Medication Sig Dispense Refill   Dulaglutide (TRULICITY [place])  Inject into the skin.     felodipine (PLENDIL) 5 MG 24 hr tablet 5 mg daily.   4   levothyroxine (SYNTHROID, LEVOTHROID) 88 MCG tablet Take 100 mcg by mouth daily.  5   lisinopril (PRINIVIL,ZESTRIL) 20 MG tablet Take 20 mg by mouth daily.  5   lithium carbonate (ESKALITH) 450 MG CR tablet 1 AND 1/2 TABLETS IN THE MORNING AND 2 TABLETS AT NIGHT 105 tablet 0   loratadine (CLARITIN) 10 MG tablet Take by mouth.     LORazepam (ATIVAN) 0.5 MG tablet TAKE 3 TABLETS (1.5 MG TOTAL) BY MOUTH AT BEDTIME. 90 tablet 1   metFORMIN (GLUCOPHAGE-XR) 500 MG 24 hr tablet Take 1,500 mg by mouth daily.  5   methocarbamol (ROBAXIN) 500 MG tablet take 1 tablet at bed time as needed     risperiDONE (RISPERDAL) 1 MG tablet TAKE 1 TABLET BY MOUTH EVERYDAY AT BEDTIME 90 tablet 0   tamsulosin (FLOMAX) 0.4 MG CAPS capsule Take 0.4 mg by mouth.     XIGDUO XR 04-999 MG TB24 Take 1 tablet by mouth every morning.     Current Facility-Administered  Medications  Medication Dose Route Frequency Provider Last Rate Last Admin   acetaminophen (TYLENOL) tablet 500 mg  500 mg Oral Once Ratcliffe, Heather R, PA-C        Medication Side Effects: None  Allergies:  Allergies  Allergen Reactions   Erythromycin Ethylsuccinate Rash   Simvastatin Other (See Comments) and Rash    Past Medical History:  Diagnosis Date   Bipolar 1 disorder ([place])    Depression    Diabetes mellitus without complication ([place])    type 2   Dysrhythmia    Hyperlipidemia    Hypertension    Hypothyroidism    LVH (left ventricular hypertrophy)    Morbid obesity ([facility])    Sleep apnea    on CPAP   Steatohepatitis     History reviewed. No pertinent family history.  Social History   Socioeconomic History   Marital status: Married    Spouse name: Not on file   Number of children: Not on file   Years of education: Not on file   Highest education level: Not on file  Occupational History   Not on file  Tobacco Use   Smoking status: Former    Packs/day: 2.00    Years: 6.00    Total pack years: 12.00    Types: Cigarettes    Start date: 12/11/1973    Quit date: 12/12/1979    Years since quitting: 42.5   Smokeless tobacco: Never  Vaping Use   Vaping Use: Never used  Substance and Sexual Activity   Alcohol use: Yes    Alcohol/week: 4.0 standard drinks of alcohol    Types: 4 Cans of beer per week   Drug use: Never   Sexual activity: Not on file  Other Topics Concern   Not on file  Social History Narrative   Not on file   Social Determinants of Health   Financial Resource Strain: Not on file  Food Insecurity: Not on file  Transportation Needs: Not on file  Physical Activity: Not on file  Stress: Not on file  Social Connections: Not on file  Intimate Partner Violence: Not on file    Past Medical History, Surgical history, Social history, and Family history were reviewed and updated as appropriate.   Please see review of systems for further  details on the patient's review from today.   Objective:  Physical Exam:  There were no vitals taken for this visit.  Physical Exam Constitutional:      General: He is not in acute distress.    Appearance: He is obese.  Musculoskeletal:        General: No deformity.  Neurological:     Mental Status: He is alert and oriented to person, place, and time.     Motor: Abnormal muscle tone present.     Coordination: Coordination normal.     Gait: Gait abnormal.     Comments: A little smaller step length in gait. Mild-mod increase motor tone No sig tremor noticed  Psychiatric:        Attention and Perception: Attention and perception normal.        Mood and Affect: Mood is anxious. Mood is not depressed. Affect is not labile, blunt, angry or tearful.        Speech: Speech normal. Speech is not rapid and pressured.        Behavior: Behavior normal.        Thought Content: Thought content normal. Thought content is not paranoid or delusional. Thought content does not include homicidal or suicidal ideation. Thought content does not include suicidal plan.        Cognition and Memory: Cognition normal.        Judgment: Judgment normal.     Comments: Insight intact. No auditory or visual hallucinations. No delusions.  Mild drooling and head forward     Lab Review:     Component Value Date/Time   NA 138 03/31/2022 1140   K 4.5 03/31/2022 1140   CL 102 03/31/2022 1140   CO2 17 (L) 01/06/2020 0724   GLUCOSE 175 (H) 03/31/2022 1140   BUN 16 03/31/2022 1140   CREATININE 1.06 03/31/2022 1140   CALCIUM 9.7 03/31/2022 1140   PROT 7.4 03/31/2022 1140   ALBUMIN 5.0 (H) 03/31/2022 1140   AST 8 03/31/2022 1140   ALKPHOS 57 03/31/2022 1140   BILITOT 0.3 03/31/2022 1140   GFRNONAA 73 01/06/2020 0724   GFRAA 84 01/06/2020 0724    No results found for: "WBC", "RBC", "HGB", "HCT", "PLT", "MCV", "MCH", "MCHC", "RDW", "LYMPHSABS", "MONOABS", "EOSABS", "BASOSABS"  Lithium Lvl  Date Value Ref  Range Status  10/01/2021 1.2 0.5 - 1.2 mmol/L Final    Comment:    A concentration of 0.5-0.8 mmol/L is advised for long-term use; concentrations of up to 1.2 mmol/L may be necessary during acute treatment.                                  Detection Limit = 0.1                           <0.1 indicates None Detected     10/01/21 Lithium 1.2 on 450 mg tablet 1 and 1/2 at night  No results found for: "PHENYTOIN", "PHENOBARB", "VALPROATE", "CBMZ"   BMP was January 31, 2018 and was within normal limits except for glucose 227, TSH was 4.64, and lithium level was 0.8 which is stable.  .res Assessment: Plan:    Carl Melton was seen today for follow-up and sleeping problem.  Diagnoses and all orders for this visit:  Bipolar I disorder ([place]) -     Lithium level  Insomnia due to mental condition  Obstructive sleep apnea syndrome, severe  Nocturnal hypoxemia  Low serum vitamin B12  Parkinsonism, unspecified Parkinsonism type   Greater than 50% of 30 min face to face time with patient was spent on counseling and coordination of care. We discussed the following: Long term benefit lithium and risperidone and is stable with past history of psychotic features if not on antipsychotic.  We discussed the short-term risks associated with benzodiazepines including sedation and increased fall risk among others.  Discussed long-term side effect risk including dependence, potential withdrawal symptoms, and the potential eventual dose-related risk of dementia.  But recent studies from 2020 dispute this association between benzodiazepines and dementia risk. Newer studies in 2020 do not support an association with dementia.  Insomnia with broken sleep and failed alternatives.  Likely at part related to poorly treated OSA. continue lorazepam 1.5 mg HS  continue lithium to 900 mg AM and 1 and 1/2 of 450 mg tablets at night.   Repeat lithium level.  He took ith this am about 545 AM and will check level about  noon today. Then consider reduction.    Counseled patient regarding potential benefits, risks, and side effects of lithium to include potential risk of lithium affecting thyroid and renal function.  Discussed need for periodic lab monitoring to determine drug level and to assess for potential adverse effects.  Counseled patient regarding signs and symptoms of lithium toxicity and advised that they notify office immediately or seek urgent medical attention if experiencing these signs and symptoms.  Patient advised to contact office with any questions or concerns.  Disc diagnosing PD vs EPS from risperidone.  Disc risk of lowering the dose.  Reduced risperidone to 1/2 of 1 mg HS to see if Parkinsonism is better failed but more depressed. Therefore continue risperidone 46m HS  Extensive disc of severe sleep apnea and not using O2 as rec.  Suspect some cognitive problems and other sx related..[name] keep working on it.  FU 3 - 4 mos  CLynder Parents MD, DFAPA    Please see After Visit Summary for patient specific instructions.  Future Appointments  Date Time Provider DBerlin 06/30/2022  2:00 PM NMilinda Pointer MD [facility] None  08/29/2022  1:30 PM YDeneise Lever MD [facility] None     Orders Placed This Encounter  Procedures   Lithium level       -------------------------------

## 2022-06-22 NOTE — Telephone Encounter (Signed)
NOTED

## 2022-06-22 NOTE — Telephone Encounter (Signed)
Post procedure follow up.  LM

## 2022-06-22 NOTE — Telephone Encounter (Signed)
PT stated that he was returning call that he missed. PT stated that he is doing well.

## 2022-06-24 LAB — LITHIUM LEVEL: Lithium Lvl: 1.8 mmol/L (ref 0.5–1.2)

## 2022-06-24 NOTE — Progress Notes (Signed)
lihtium level is too high (1.8 but it was not a trough, but about 6 hours after dose).  Tell him to reduce lithium to 1 in the [place] 2 at night of the 450 mg tablets.[name]

## 2022-06-26 NOTE — Progress Notes (Unsigned)
PROVIDER NOTE: Information contained herein reflects review and annotations entered in association with encounter. Interpretation of such information and data should be left to medically-trained personnel. Information provided to patient can be located elsewhere in the medical record under "Patient Instructions". Document created using STT-dictation technology, any transcriptional errors that may result from process are unintentional.    Patient: Carl Melton  Service Category: E/M  Provider: Gaspar Cola, MD  DOB: 1956/01/17  DOS: 06/30/2022  Referring Provider: Kirk Ruths, MD  MRN: [id number]  Specialty: Interventional Pain Management  PCP: Kirk Ruths, MD  Type: Established Patient  Setting: Ambulatory outpatient    Location: Office  Delivery: Face-to-face     HPI  [name], a 66 y.o. year old male, is here today because of his Chronic neck pain [M54.2, G89.29]. Carl Melton's primary complain today is No chief complaint on file. Last encounter: My last encounter with him was on 06/22/2022. Pertinent problems: Carl Melton has Cervical radiculopathy; Chronic pain syndrome; Chronic neck pain (1ry area of Pain) (Bilateral) (R>L); History of vertigo; Vertigo of cervical arthrosis syndrome; Abnormal MRI, cervical spine (10/26/2021); Cervical facet arthropathy (Multilevel) (Bilateral); Cervical facet syndrome; Spondylosis without myelopathy or radiculopathy, cervical region; Cervical foraminal stenosis (Multilevel) (Bilateral); and Cervical central spinal stenosis (C2-C7) on their pertinent problem list. Pain Assessment: Severity of   is reported as a  /10. Location:    / . Onset:  . Quality:  . Timing:  . Modifying factor(s):  [name] Vitals:  vitals were not taken for this visit.  BMI: Estimated body mass index is 41.05 kg/m as calculated from the following:   Height as of 06/21/22: _0  (1.727 m).   Weight as of 06/21/22: 270 lb (122.5 kg).  Reason for encounter:  post-procedure evaluation and assessment. ***  Post-procedure evaluation   Type: Cervical Facet Medial Branch Block(s) #1  Laterality: Right  Level: TON, C3, & C4  Medial Branch Level(s). Injecting these levels blocks the C2-3 & C3-4 cervical facet joints.  Imaging: Fluoroscopic guidance Anesthesia: Local anesthesia (1-2% Lidocaine) Anxiolysis: None.  The patient did not follow the N.P.O. orders. Sedation: No Sedation                       DOS: 06/21/2022  Performed by: Gaspar Cola, MD  Purpose: Diagnostic/Therapeutic Indications: Cervicalgia (cervical spine axial pain) severe enough to impact quality of life or function. 1. Cervical facet syndrome   2. Cervical facet arthropathy (Multilevel) (Bilateral)   3. Chronic neck pain (1ry area of Pain) (Bilateral) (R>L)   4. Spondylosis without myelopathy or radiculopathy, cervical region   5. Abnormal MRI, cervical spine (10/26/2021)    NAS-11 Pain score:   Pre-procedure: 8 /10   Post-procedure: 0-No pain/10   On the open 05/23/2022 I personally informed the patient on how to prepare for the procedure and I provided him with written information about it on the after visit summary (AVS).  The patient did not read any of the material in today he came in having had breakfast.  The patient was given the choice of rescheduling or doing the procedure with no sedation.  He chose to have it done with no sedation.       Effectiveness:  Initial hour after procedure:   ***. Subsequent 4-6 hours post-procedure:   ***. Analgesia past initial 6 hours:   ***. Ongoing improvement:  Analgesic:  *** Function:    ***    ROM:    ***  Pharmacotherapy Assessment  Analgesic: None MME/day: 0 mg/day   Monitoring: [place] PMP: PDMP reviewed during this encounter.       Pharmacotherapy: No side-effects or adverse reactions reported. Compliance: No problems identified. Effectiveness: Clinically acceptable.  No notes on file  No results found  for: "CBDTHCR" No results found for: "D8THCCBX" No results found for: "D9THCCBX"  UDS:  Summary  Date Value Ref Range Status  03/31/2022 Note  Final    Comment:    ==================================================================== Compliance Drug Analysis, Ur ==================================================================== Test                             Result       Flag       Units  Drug Present and Declared for Prescription Verification   Lorazepam                      543          EXPECTED   ng/mg creat    Source of lorazepam is a scheduled prescription medication.  Drug Absent but Declared for Prescription Verification   Risperidone                    Not Detected UNEXPECTED ==================================================================== Test                      Result    Flag   Units      Ref Range   Creatinine              30               mg/dL      >=20 ==================================================================== Declared Medications:  The flagging and interpretation on this report are based on the  following declared medications.  Unexpected results may arise from  inaccuracies in the declared medications.   **Note: The testing scope of this panel includes these medications:   Lorazepam (Ativan)  Risperidone (Risperdal)   **Note: The testing scope of this panel does not include the  following reported medications:   Dulaglutide  Felodipine (Plendil)  Levothyroxine (Synthroid)  Lisinopril (Zestril)  Lithium  Loratadine (Claritin)  Metformin  Tamsulosin (Flomax) ==================================================================== For clinical consultation, please call [phone]. ====================================================================       ROS  Constitutional: Denies any fever or chills Gastrointestinal: No reported hemesis, hematochezia, vomiting, or acute GI distress Musculoskeletal: Denies any acute onset joint  swelling, redness, loss of ROM, or weakness Neurological: No reported episodes of acute onset apraxia, aphasia, dysarthria, agnosia, amnesia, paralysis, loss of coordination, or loss of consciousness  Medication Review  Dapagliflozin Pro-metFORMIN ER, Dulaglutide, LORazepam, felodipine, levothyroxine, lisinopril, lithium carbonate, loratadine, metFORMIN, methocarbamol, risperiDONE, and tamsulosin  History Review  Allergy: Carl Melton is allergic to erythromycin ethylsuccinate and simvastatin. Drug: Carl Melton  reports no history of drug use. Alcohol:  reports current alcohol use of about 4.0 standard drinks of alcohol per week. Tobacco:  reports that he quit smoking about 42 years ago. His smoking use included cigarettes. He started smoking about 48 years ago. He has a 12.00 pack-year smoking history. He has never used smokeless tobacco. Social: Carl Melton  reports that he quit smoking about 42 years ago. His smoking use included cigarettes. He started smoking about 48 years ago. He has a 12.00 pack-year smoking history. He has never used smokeless tobacco. He reports current alcohol use of about 4.0 standard drinks of alcohol  per week. He reports that he does not use drugs. Medical:  has a past medical history of Bipolar 1 disorder ([place]), Depression, Diabetes mellitus without complication ([place]), Dysrhythmia, Hyperlipidemia, Hypertension, Hypothyroidism, LVH (left ventricular hypertrophy), Morbid obesity ([place]), Sleep apnea, and Steatohepatitis. Surgical: Carl Melton  has a past surgical history that includes Colonoscopy (01/04/2013, 08/04/2009); Esophagogastroduodenoscopy (03/21/2002); Uvulopalatopharyngoplasty (1997); Knee surgery; Appendectomy; Colonoscopy with propofol (N/A, 08/06/2018); and Colonoscopy with propofol (N/A, 05/11/2020). Family: family history is not on file.  Laboratory Chemistry Profile   Renal Lab Results  Component Value Date   BUN 16 03/31/2022   CREATININE 1.06 03/31/2022    BCR 15 03/31/2022   GFRAA 84 01/06/2020   GFRNONAA 73 01/06/2020    Hepatic Lab Results  Component Value Date   AST 8 03/31/2022   ALBUMIN 5.0 (H) 03/31/2022   ALKPHOS 57 03/31/2022    Electrolytes Lab Results  Component Value Date   NA 138 03/31/2022   K 4.5 03/31/2022   CL 102 03/31/2022   CALCIUM 9.7 03/31/2022   MG 2.0 03/31/2022    Bone Lab Results  Component Value Date   25OHVITD1 13 (L) 03/31/2022   25OHVITD2 <1.0 03/31/2022   25OHVITD3 13 03/31/2022    Inflammation (CRP: Acute Phase) (ESR: Chronic Phase) Lab Results  Component Value Date   CRP <1 03/31/2022   ESRSEDRATE 16 03/31/2022         Note: Above Lab results reviewed.  Recent Imaging Review  DG PAIN CLINIC C-ARM 1-60 MIN NO REPORT Fluoro was used, but no Radiologist interpretation will be provided.  Please refer to "NOTES" tab for provider progress note. Note: Reviewed        Physical Exam  General appearance: Well nourished, well developed, and well hydrated. In no apparent acute distress Mental status: Alert, oriented x 3 (person, place, & time)       Respiratory: No evidence of acute respiratory distress Eyes: PERLA Vitals: There were no vitals taken for this visit. BMI: Estimated body mass index is 41.05 kg/m as calculated from the following:   Height as of 06/21/22: _0  (1.727 m).   Weight as of 06/21/22: 270 lb (122.5 kg). Ideal: Ideal body weight: 68.4 kg (150 lb 12.7 oz) Adjusted ideal body weight: 90 kg (198 lb 7.6 oz)  Assessment   Diagnosis Status  1. Chronic neck pain (1ry area of Pain) (Bilateral) (R>L)   2. Cervical facet syndrome   3. Chronic pain syndrome    Controlled Controlled Controlled   Updated Problems: No problems updated.  Plan of Care  Problem-specific:  No problem-specific Assessment & Plan notes found for this encounter.  Carl Melton has a current medication list which includes the following long-term medication(s): felodipine, levothyroxine,  lisinopril, lithium carbonate, metformin, and risperidone.  Pharmacotherapy (Medications Ordered): No orders of the defined types were placed in this encounter.  Orders:  No orders of the defined types were placed in this encounter.  Follow-up plan:   No follow-ups on file.     Interventional Therapies  Risk  Complexity Considerations:      WNL   Planned  Pending:   Diagnostic right C2-3, C3-4 Facet joints inj. #1    Under consideration:   Diagnostic right vs bilateral cervical facet MBB #1  Surgery discussed by [facility], a C3 to C4 ACDF offered.  (01/07/2022)    Completed:   None at this time   Completed by other providers:   Although the actual procedure notes are unavailable,  there are references to an interlaminar cervical epidural steroid injection at the C7-T1 level which was apparently done on 10/27/2021.  This was followed by a left C4 selective nerve root epidural steroid injection on 12/10/2021.   Therapeutic  Palliative (PRN) options:   None established      Recent Visits Date Type Provider Dept  06/21/22 Procedure visit Milinda Pointer, MD [facility]  05/23/22 Office Visit Milinda Pointer, MD [facility]  03/31/22 Office Visit Milinda Pointer, MD [facility]  Showing recent visits within past 90 days and meeting all other requirements Future Appointments Date Type Provider Dept  06/30/22 Appointment Milinda Pointer, MD [facility]  Showing future appointments within next 90 days and meeting all other requirements  I discussed the assessment and treatment plan with the patient. The patient was provided an opportunity to ask questions and all were answered. The patient agreed with the plan and demonstrated an understanding of the instructions.  Patient advised to call back or seek an in-person evaluation if the symptoms or condition worsens.  Duration of encounter: *** minutes.  Total time on  encounter, as per AMA guidelines included both the face-to-face and non-face-to-face time personally spent by the physician and/or other qualified health care professional(s) on the day of the encounter (includes time in activities that require the physician or other qualified health care professional and does not include time in activities normally performed by clinical staff). Physician's time may include the following activities when performed: preparing to see the patient (eg, review of tests, pre-charting review of records) obtaining and/or reviewing separately obtained history performing a medically appropriate examination and/or evaluation counseling and educating the patient/family/caregiver ordering medications, tests, or procedures referring and communicating with other health care professionals (when not separately reported) documenting clinical information in the electronic or other health record independently interpreting results (not separately reported) and communicating results to the patient/ family/caregiver care coordination (not separately reported)  Note by: Gaspar Cola, MD Date: 06/30/2022; Time: 10:50 AM

## 2022-06-27 NOTE — Progress Notes (Signed)
He was notified on 12/15.

## 2022-06-28 ENCOUNTER — Other Ambulatory Visit: Payer: Self-pay | Admitting: Psychiatry

## 2022-06-28 DIAGNOSIS — F319 Bipolar disorder, unspecified: Secondary | ICD-10-CM

## 2022-06-30 ENCOUNTER — Encounter: Payer: Self-pay | Admitting: Pain Medicine

## 2022-06-30 ENCOUNTER — Ambulatory Visit: Payer: No Typology Code available for payment source | Attending: Pain Medicine | Admitting: Pain Medicine

## 2022-06-30 VITALS — BP 160/72 | HR 93 | Temp 98.0°F | Resp 16 | Ht 68.0 in | Wt 271.0 lb

## 2022-06-30 DIAGNOSIS — M542 Cervicalgia: Secondary | ICD-10-CM | POA: Diagnosis not present

## 2022-06-30 DIAGNOSIS — G8929 Other chronic pain: Secondary | ICD-10-CM | POA: Diagnosis not present

## 2022-06-30 DIAGNOSIS — G894 Chronic pain syndrome: Secondary | ICD-10-CM

## 2022-06-30 DIAGNOSIS — M47812 Spondylosis without myelopathy or radiculopathy, cervical region: Secondary | ICD-10-CM | POA: Diagnosis not present

## 2022-06-30 NOTE — Patient Instructions (Signed)
______________________________________________________________________  Preparing for your procedure  During your procedure appointment there will be: No Prescription Refills. No disability issues to discussed. No medication changes or discussions.  Instructions: Food intake: Avoid eating anything solid for at least 8 hours prior to your procedure. Clear liquid intake: You may take clear liquids such as water up to 2 hours prior to your procedure. (No carbonated drinks. No soda.) Transportation: Unless otherwise stated by your physician, bring a driver. Morning Medicines: Except for blood thinners, take all of your other morning medications with a sip of water. Make sure to take your heart and blood pressure medicines. If your blood pressure's lower number is above 100, the case will be rescheduled. Blood thinners: If you take a blood thinner, but were not instructed to stop it, call our office [phone] and ask to talk to a nurse. Not stopping a blood thinner prior to certain procedures could lead to serious complications. Diabetics on insulin: Notify the staff so that you can be scheduled 1st case in the morning. If your diabetes requires high dose insulin, take only  of your normal insulin dose the morning of the procedure and notify the staff that you have done so. Preventing infections: Shower with an antibacterial soap the morning of your procedure.  Build-up your immune system: Take 1000 mg of Vitamin C with every meal (3 times a day) the day prior to your procedure. Antibiotics: Inform the nursing staff if you are taking any antibiotics or if you have any conditions that may require antibiotics prior to procedures. (Example: recent joint implants)   Pregnancy: If you are pregnant make sure to notify the nursing staff. Not doing so may result in injury to the fetus, including death.  Sickness: If you have a cold, fever, or any active infections, call and cancel or reschedule your  procedure. Receiving steroids while having an infection may result in complications. Arrival: You must be in the facility at least 30 minutes prior to your scheduled procedure. Tardiness: Your scheduled time is also the cutoff time. If you do not arrive at least 15 minutes prior to your procedure, you will be rescheduled.  Children: Do not bring any children with you. Make arrangements to keep them home. Dress appropriately: There is always a possibility that your clothing may get soiled. Avoid long dresses. Valuables: Do not bring any jewelry or valuables.  Reasons to call and reschedule or cancel your procedure: (Following these recommendations will minimize the risk of a serious complication.) Surgeries: Avoid having procedures within 2 weeks of any surgery. (Avoid for 2 weeks before or after any surgery). Flu Shots: Avoid having procedures within 2 weeks of a flu shots or . (Avoid for 2 weeks before or after immunizations). Barium: Avoid having a procedure within 7-10 days after having had a radiological study involving the use of radiological contrast. (Myelograms, Barium swallow or enema study). Heart attacks: Avoid any elective procedures or surgeries for the initial 6 months after a "Myocardial Infarction" (Heart Attack). Blood thinners: It is imperative that you stop these medications before procedures. Let us know if you if you take any blood thinner.  Infection: Avoid procedures during or within two weeks of an infection (including chest colds or gastrointestinal problems). Symptoms associated with infections include: Localized redness, fever, chills, night sweats or profuse sweating, burning sensation when voiding, cough, congestion, stuffiness, runny nose, sore throat, diarrhea, nausea, vomiting, cold or Flu symptoms, recent or current infections. It is specially important if the infection is  over the area that we intend to treat. Heart and lung problems: Symptoms that may suggest an  active cardiopulmonary problem include: cough, chest pain, breathing difficulties or shortness of breath, dizziness, ankle swelling, uncontrolled high or unusually low blood pressure, and/or palpitations. If you are experiencing any of these symptoms, cancel your procedure and contact your primary care physician for an evaluation.  Remember:  Regular Business hours are:  Monday to Thursday 8:00 AM to 4:00 PM  Provider's Schedule: Milinda Pointer, MD:  Procedure days: Tuesday and Thursday 7:30 AM to 4:00 PM  Gillis Santa, MD:  Procedure days: Monday and Wednesday 7:30 AM to 4:00 PM  ______________________________________________________________________    ____________________________________________________________________________________________  General Risks and Possible Complications  Patient Responsibilities: It is important that you read this as it is part of your informed consent. It is our duty to inform you of the risks and possible complications associated with treatments offered to you. It is your responsibility as a patient to read this and to ask questions about anything that is not clear or that you believe was not covered in this document.  Patient's Rights: You have the right to refuse treatment. You also have the right to change your mind, even after initially having agreed to have the treatment done. However, under this last option, if you wait until the last second to change your mind, you may be charged for the materials used up to that point.  Introduction: Medicine is not an Chief Strategy Officer. Everything in Medicine, including the lack of treatment(s), carries the potential for danger, harm, or loss (which is by definition: Risk). In Medicine, a complication is a secondary problem, condition, or disease that can aggravate an already existing one. All treatments carry the risk of possible complications. The fact that a side effects or complications occurs, does not imply  that the treatment was conducted incorrectly. It must be clearly understood that these can happen even when everything is done following the highest safety standards.  No treatment: You can choose not to proceed with the proposed treatment alternative. The "PRO(s)" would include: avoiding the risk of complications associated with the therapy. The "CON(s)" would include: not getting any of the treatment benefits. These benefits fall under one of three categories: diagnostic; therapeutic; and/or palliative. Diagnostic benefits include: getting information which can ultimately lead to improvement of the disease or symptom(s). Therapeutic benefits are those associated with the successful treatment of the disease. Finally, palliative benefits are those related to the decrease of the primary symptoms, without necessarily curing the condition (example: decreasing the pain from a flare-up of a chronic condition, such as incurable terminal cancer).  General Risks and Complications: These are associated to most interventional treatments. They can occur alone, or in combination. They fall under one of the following six (6) categories: no benefit or worsening of symptoms; bleeding; infection; nerve damage; allergic reactions; and/or death. No benefits or worsening of symptoms: In Medicine there are no guarantees, only probabilities. No healthcare provider can ever guarantee that a medical treatment will work, they can only state the probability that it may. Furthermore, there is always the possibility that the condition may worsen, either directly, or indirectly, as a consequence of the treatment. Bleeding: This is more common if the patient is taking a blood thinner, either prescription or over the counter (example: Goody Powders, Fish oil, Aspirin, Garlic, etc.), or if suffering a condition associated with impaired coagulation (example: Hemophilia, cirrhosis of the liver, low platelet counts, etc.). However, even if  you  do not have one on these, it can still happen. If you have any of these conditions, or take one of these drugs, make sure to notify your treating physician. Infection: This is more common in patients with a compromised immune system, either due to disease (example: diabetes, cancer, human immunodeficiency virus [HIV], etc.), or due to medications or treatments (example: therapies used to treat cancer and rheumatological diseases). However, even if you do not have one on these, it can still happen. If you have any of these conditions, or take one of these drugs, make sure to notify your treating physician. Nerve Damage: This is more common when the treatment is an invasive one, but it can also happen with the use of medications, such as those used in the treatment of cancer. The damage can occur to small secondary nerves, or to large primary ones, such as those in the spinal cord and brain. This damage may be temporary or permanent and it may lead to impairments that can range from temporary numbness to permanent paralysis and/or brain death. Allergic Reactions: Any time a substance or material comes in contact with our body, there is the possibility of an allergic reaction. These can range from a mild skin rash (contact dermatitis) to a severe systemic reaction (anaphylactic reaction), which can result in death. Death: In general, any medical intervention can result in death, most of the time due to an unforeseen complication. ____________________________________________________________________________________________

## 2022-06-30 NOTE — Progress Notes (Signed)
Safety precautions to be maintained throughout the outpatient stay will include: orient to surroundings, keep bed in low position, maintain call bell within reach at all times, provide assistance with transfer out of bed and ambulation.  

## 2022-07-05 ENCOUNTER — Other Ambulatory Visit: Payer: Self-pay | Admitting: Psychiatry

## 2022-07-05 DIAGNOSIS — F5105 Insomnia due to other mental disorder: Secondary | ICD-10-CM

## 2022-07-05 NOTE — Telephone Encounter (Signed)
I have ono order for pt that was placed on 12/6.  I originally sent to [facility] but got response back from them that pt is no longer with them.  Had left 3 vm's for pt to find out who dme is and finally got response.  Wife left vm he is with [facility].  Called them and they don't do ono's.  Called wife back and she states husband has gone back on his oxygen due to his breathing at night & she states he is doing better now.  She states he is using [employer] for the O2.  She wants to know if Carl Melton is even needed now because he is doing better.  I told her I would send message to [facility] and if ono is needed the order would be sent to [employer] since they are providing the O2.  Will route to triage to see if ono is needed at this time.

## 2022-07-06 DIAGNOSIS — E7800 Pure hypercholesterolemia, unspecified: Secondary | ICD-10-CM | POA: Diagnosis not present

## 2022-07-06 DIAGNOSIS — E1169 Type 2 diabetes mellitus with other specified complication: Secondary | ICD-10-CM | POA: Diagnosis not present

## 2022-07-12 DIAGNOSIS — E118 Type 2 diabetes mellitus with unspecified complications: Secondary | ICD-10-CM | POA: Diagnosis not present

## 2022-07-12 DIAGNOSIS — E7800 Pure hypercholesterolemia, unspecified: Secondary | ICD-10-CM | POA: Diagnosis not present

## 2022-07-12 DIAGNOSIS — E039 Hypothyroidism, unspecified: Secondary | ICD-10-CM | POA: Diagnosis not present

## 2022-07-12 DIAGNOSIS — E538 Deficiency of other specified B group vitamins: Secondary | ICD-10-CM | POA: Diagnosis not present

## 2022-07-14 DIAGNOSIS — E7800 Pure hypercholesterolemia, unspecified: Secondary | ICD-10-CM | POA: Diagnosis not present

## 2022-07-14 DIAGNOSIS — E1169 Type 2 diabetes mellitus with other specified complication: Secondary | ICD-10-CM | POA: Diagnosis not present

## 2022-07-17 DIAGNOSIS — G4733 Obstructive sleep apnea (adult) (pediatric): Secondary | ICD-10-CM | POA: Diagnosis not present

## 2022-07-17 NOTE — Progress Notes (Signed)
PROVIDER NOTE: Interpretation of information contained herein should be left to medically-trained personnel. Specific patient instructions are provided elsewhere under "Patient Instructions" section of medical record. This document was created in part using STT-dictation technology, any transcriptional errors that may result from this process are unintentional.  Patient: Carl Melton Type: Established DOB: Oct 23, 1955 MRN: [id number] PCP: Lauro Regulus, MD  Service: Procedure DOS: 07/21/2022 Setting: Ambulatory Location: Ambulatory outpatient facility Delivery: Face-to-face Provider: Oswaldo Done, MD Specialty: Interventional Pain Management Specialty designation: 09 Location: Outpatient facility Ref. Prov.: Delano Metz, MD     Procedure:           Type: Cervical Facet Medial Branch Block(s) #2  Laterality: Right  Level: TON, C3, and C4 Medial Branch Level(s). Injecting these levels blocks the C2-3 and C3-4 cervical facet joints.  Imaging: Fluoroscopic guidance Anesthesia: Local anesthesia (1-2% Lidocaine) Anxiolysis: None Sedation: No Sedation                       DOS: 07/21/2022  Performed by: Oswaldo Done, MD  Purpose: Diagnostic/Therapeutic Indications: Cervicalgia (cervical spine axial pain) severe enough to impact quality of life or function. 1. Cervical facet syndrome   2. Spondylosis without myelopathy or radiculopathy, cervical region   3. Cervical facet arthropathy (Multilevel) (Bilateral)   4. Cervical facet hypertrophy (Multilevel) (Bilateral)   5. Chronic neck pain (1ry area of Pain) (Bilateral) (R>L)   6. Cervicalgia   7. Vertigo of cervical arthrosis syndrome   8. Abnormal MRI, cervical spine (10/26/2021)    NAS-11 Pain score:   Pre-procedure: 5 /10   Post-procedure: 8 /10     Position / Prep / Materials:  Position: Prone. Head in cradle. C-spine slightly flexed. Prep solution: DuraPrep (Iodine Povacrylex [0.7% available iodine] and  Isopropyl Alcohol, 74% w/w) Prep Area: Posterior Cervico-thoracic Region. From occipital ridge to tip of scapula, and from shoulder to shoulder. Entire posterior and lateral neck surface. Materials:  Tray: Block Needle(s):  Type: Spinal  Gauge (G): 22"  Length: 3.5-in  Qty: 4  Pre-op H&P Assessment:  Carl Melton is a 67 y.o. (year old), male patient, seen today for interventional treatment. He  has a past surgical history that includes Colonoscopy (01/04/2013, 08/04/2009); Esophagogastroduodenoscopy (03/21/2002); Uvulopalatopharyngoplasty (1997); Knee surgery; Appendectomy; Colonoscopy with propofol (N/A, 08/06/2018); and Colonoscopy with propofol (N/A, 05/11/2020). Carl Melton has a current medication list which includes the following prescription(s): dulaglutide, felodipine, levothyroxine, lisinopril, lithium carbonate, loratadine, lorazepam, metformin, methocarbamol, risperidone, tamsulosin, and xigduo xr, and the following Facility-Administered Medications: acetaminophen. His primarily concern today is the Neck Pain (right)  Initial Vital Signs:  Pulse/HCG Rate: 92  Temp: (!) 97 F (36.1 C) Resp: 16 BP: 122/67 SpO2: 97 %  BMI: Estimated body mass index is 41.05 kg/m as calculated from the following:   Height as of this encounter: 5\' 8"  (1.727 m).   Weight as of this encounter: 270 lb (122.5 kg).  Risk Assessment: Allergies: Reviewed. He is allergic to erythromycin ethylsuccinate and simvastatin.  Allergy Precautions: None required Coagulopathies: Reviewed. None identified.  Blood-thinner therapy: None at this time Active Infection(s): Reviewed. None identified. Carl Melton is afebrile  Site Confirmation: Carl Melton was asked to confirm the procedure and laterality before marking the site Procedure checklist: Completed Consent: Before the procedure and under the influence of no sedative(s), amnesic(s), or anxiolytics, the patient was informed of the treatment options, risks and  possible complications. To fulfill our ethical and legal obligations, as recommended by the  American Medical Association's Code of Ethics, I have informed the patient of my clinical impression; the nature and purpose of the treatment or procedure; the risks, benefits, and possible complications of the intervention; the alternatives, including doing nothing; the risk(s) and benefit(s) of the alternative treatment(s) or procedure(s); and the risk(s) and benefit(s) of doing nothing. The patient was provided information about the general risks and possible complications associated with the procedure. These may include, but are not limited to: failure to achieve desired goals, infection, bleeding, organ or nerve damage, allergic reactions, paralysis, and death. In addition, the patient was informed of those risks and complications associated to Spine-related procedures, such as failure to decrease pain; infection (i.e.: Meningitis, epidural or intraspinal abscess); bleeding (i.e.: epidural hematoma, subarachnoid hemorrhage, or any other type of intraspinal or peri-dural bleeding); organ or nerve damage (i.e.: Any type of peripheral nerve, nerve root, or spinal cord injury) with subsequent damage to sensory, motor, and/or autonomic systems, resulting in permanent pain, numbness, and/or weakness of one or several areas of the body; allergic reactions; (i.e.: anaphylactic reaction); and/or death. Furthermore, the patient was informed of those risks and complications associated with the medications. These include, but are not limited to: allergic reactions (i.e.: anaphylactic or anaphylactoid reaction(s)); adrenal axis suppression; blood sugar elevation that in diabetics may result in ketoacidosis or comma; water retention that in patients with history of congestive heart failure may result in shortness of breath, pulmonary edema, and decompensation with resultant heart failure; weight gain; swelling or edema;  medication-induced neural toxicity; particulate matter embolism and blood vessel occlusion with resultant organ, and/or nervous system infarction; and/or aseptic necrosis of one or more joints. Finally, the patient was informed that Medicine is not an exact science; therefore, there is also the possibility of unforeseen or unpredictable risks and/or possible complications that may result in a catastrophic outcome. The patient indicated having understood very clearly. We have given the patient no guarantees and we have made no promises. Enough time was given to the patient to ask questions, all of which were answered to the patient's satisfaction. Carl Melton has indicated that he wanted to continue with the procedure. Attestation: I, the ordering provider, attest that I have discussed with the patient the benefits, risks, side-effects, alternatives, likelihood of achieving goals, and potential problems during recovery for the procedure that I have provided informed consent. Date  Time: 07/21/2022 10:31 AM  Pre-Procedure Preparation:  Monitoring: As per clinic protocol. Respiration, ETCO2, SpO2, BP, heart rate and rhythm monitor placed and checked for adequate function Safety Precautions: Patient was assessed for positional comfort and pressure points before starting the procedure. Time-out: I initiated and conducted the "Time-out" before starting the procedure, as per protocol. The patient was asked to participate by confirming the accuracy of the "Time Out" information. Verification of the correct person, site, and procedure were performed and confirmed by me, the nursing staff, and the patient. "Time-out" conducted as per Joint Commission's Universal Protocol (UP.01.01.01). Time: 1051  Description/Narrative of Procedure:          Laterality: Right Targeted Levels:  TON, C3, and C4 Medial Branch Level(s).  Rationale (medical necessity): procedure needed and proper for the diagnosis and/or treatment of  the patient's medical symptoms and needs. Procedural Technique Safety Precautions: Aspiration looking for blood return was conducted prior to all injections. At no point did we inject any substances, as a needle was being advanced. No attempts were made at seeking any paresthesias. Safe injection practices and needle disposal techniques  used. Medications properly checked for expiration dates. SDV (single dose vial) medications used. Description of the Procedure: Protocol guidelines were followed. The patient was assisted into a comfortable position. The target area was identified and the area prepped in the usual manner. Skin & deeper tissues infiltrated with local anesthetic. Appropriate amount of time allowed to pass for local anesthetics to take effect. The procedure needles were then advanced to the target area. Proper needle placement secured. Negative aspiration confirmed. Solution injected in intermittent fashion, asking for systemic symptoms every 0.5cc of injectate. The needles were then removed and the area cleansed, making sure to leave some of the prepping solution back to take advantage of its long term bactericidal properties.  Technical description of process:  Third Occipital Nerve (TON) Block (MBB): The target area for the TON branch is the postero-lateral aspect of the C2-C3 articulation. Under fluoroscopic guidance, a Quincke needle was inserted until contact was made with os over the target area. After negative aspiration for blood, 0.5 mL of the nerve block solution was injected without difficulty or complication. The needle was removed intact. C3 Medial Branch Nerve Block (MBB): The target area for the C3 dorsal medial articular branch is the lateral concave waist of the articular pillar of C3. Under fluoroscopic guidance, a Quincke needle was inserted until contact was made with os over the postero-lateral aspect of the articular pillar of C3 (target area). After negative aspiration for  blood, 0.5 mL of the nerve block solution was injected without difficulty or complication. The needle was removed intact. C4 Medial Branch Nerve Block (MBB): The target area for the C4 dorsal medial articular branch is the lateral concave waist of the articular pillar of C4. Under fluoroscopic guidance, a Quincke needle was inserted until contact was made with os over the postero-lateral aspect of the articular pillar of C4 (target area). After negative aspiration for blood, 0.5 mL of the nerve block solution was injected without difficulty or complication. The needle was removed intact.   Once the entire procedure was completed, the treated area was cleaned, making sure to leave some of the prepping solution back to take advantage of its long term bactericidal properties.  Anatomy Reference Guide:       Vitals:   07/21/22 1031 07/21/22 1055 07/21/22 1059  BP: 122/67 (!) 142/88 136/76  Pulse: 92 94 93  Resp: 16 17 19   Temp: (!) 97 F (36.1 C)    SpO2: 97% 94% 94%  Weight: 270 lb (122.5 kg)    Height: 5\' 8"  (1.727 m)       Start Time: 1051 hrs. End Time: 1100 hrs.  Imaging Guidance (Spinal):          Type of Imaging Technique: Fluoroscopy Guidance (Spinal) Indication(s): Assistance in needle guidance and placement for procedures requiring needle placement in or near specific anatomical locations not easily accessible without such assistance. Exposure Time: Please see nurses notes. Contrast: None used. Fluoroscopic Guidance: I was personally present during the use of fluoroscopy. "Tunnel Vision Technique" used to obtain the best possible view of the target area. Parallax error corrected before commencing the procedure. "Direction-depth-direction" technique used to introduce the needle under continuous pulsed fluoroscopy. Once target was reached, antero-posterior, oblique, and lateral fluoroscopic projection used confirm needle placement in all planes. Images permanently stored in  EMR. Interpretation: No contrast injected. I personally interpreted the imaging intraoperatively. Adequate needle placement confirmed in multiple planes. Permanent images saved into the patient's record.  Post-operative Assessment:  Post-procedure Vital  Signs:  Pulse/HCG Rate: 93  Temp: (!) 97 F (36.1 C) Resp: 19 BP: 136/76 SpO2: 94 %  EBL: None  Complications: No immediate post-treatment complications observed by team, or reported by patient.  Note: The patient tolerated the entire procedure well. A repeat set of vitals were taken after the procedure and the patient was kept under observation following institutional policy, for this type of procedure. Post-procedural neurological assessment was performed, showing return to baseline, prior to discharge. The patient was provided with post-procedure discharge instructions, including a section on how to identify potential problems. Should any problems arise concerning this procedure, the patient was given instructions to immediately contact us, at any time, without hesitation. In any case, we plan to contact the patient by telephone for a follow-up status report regarding this interventional procedure.  Comments:  No additional relevant information.  Plan of Care  Orders:  Orders Placed This Encounter  Procedures   CERVICAL FACET (MEDIAL BRANCH NERVE BLOCK)     Scheduling Instructions:     Side: Left-sided     Level: C3-4 and C4-5 Facet joints (TON, C3 and C4 Medial Branch Nerves)     Sedation: Patient's choice.     Timeframe: Today    Order Specific Question:   Where will this procedure be performed?    Answer:   [facility]   DG [facility] C-ARM 1-60 MIN NO REPORT    Intraoperative interpretation by procedural physician at [place].    Standing Status:   Standing    Number of Occurrences:   1    Order Specific Question:   Reason for exam:    Answer:   Assistance in needle guidance and placement for  procedures requiring needle placement in or near specific anatomical locations not easily accessible without such assistance.   Informed Consent Details: Physician/Practitioner Attestation; Transcribe to consent form and obtain patient signature    Note: Always confirm laterality of pain with Mr. Zou, before procedure. Transcribe to consent form and obtain patient signature.    Order Specific Question:   Physician/Practitioner attestation of informed consent for procedure/surgical case    Answer:   I, the physician/practitioner, attest that I have discussed with the patient the benefits, risks, side effects, alternatives, likelihood of achieving goals and potential problems during recovery for the procedure that I have provided informed consent.    Order Specific Question:   Procedure    Answer:   Left Cervical facet block under fluoroscopic guidance.    Order Specific Question:   Physician/Practitioner performing the procedure    Answer:   Kiarra Kidd A. Dossie Arbour, MD    Order Specific Question:   Indication/Reason    Answer:   Chronic neck pain secondary to cervical facet syndrome   Provide equipment / supplies at bedside    Procedure tray: "Block Tray" (Disposable  single use) Skin infiltration needle: Regular 1.5-in, 25-G, (x1) Block Needle type: Spinal Amount/quantity: 4 Size: Regular (3.5-inch) Gauge: 22G    Standing Status:   Standing    Number of Occurrences:   1    Order Specific Question:   Specify    Answer:   Block Tray   Chronic Opioid Analgesic:  None.  MME/day: 0 mg/day   Medications ordered for procedure: Meds ordered this encounter  Medications   lidocaine (XYLOCAINE) 2 % (with pres) injection 400 mg   pentafluoroprop-tetrafluoroeth (GEBAUERS) aerosol   ropivacaine (PF) 2 mg/mL (0.2%) (NAROPIN) injection 9 mL   dexamethasone (DECADRON) injection 10  mg   Medications administered: We administered lidocaine, pentafluoroprop-tetrafluoroeth, ropivacaine (PF) 2 mg/mL  (0.2%), and dexamethasone.  See the medical record for exact dosing, route, and time of administration.  Follow-up plan:   Return in about 2 weeks (around 08/04/2022) for Proc-day (T,Th), (VV), (PPE).       Interventional Therapies  Risk  Complexity Considerations:      WNL   Planned  Pending:   Diagnostic right C2-3, C3-4 Facet joints inj. #1    Under consideration:   Diagnostic right vs bilateral cervical facet MBB #1  Surgery discussed by [facility], a C3 to C4 ACDF offered.  (01/07/2022)    Completed:   None at this time   Completed by other providers:   Although the actual procedure notes are unavailable, there are references to an interlaminar cervical epidural steroid injection at the C7-T1 level which was apparently done on 10/27/2021.  This was followed by a left C4 selective nerve root epidural steroid injection on 12/10/2021.   Therapeutic  Palliative (PRN) options:   None established       Recent Visits Date Type Provider Dept  06/30/22 Office Visit Delano Metz, MD [facility]  06/21/22 Procedure visit Delano Metz, MD [facility]  05/23/22 Office Visit Delano Metz, MD [facility]  Showing recent visits within past 90 days and meeting all other requirements Today's Visits Date Type Provider Dept  07/21/22 Procedure visit Delano Metz, MD [facility]  Showing today's visits and meeting all other requirements Future Appointments No visits were found meeting these conditions. Showing future appointments within next 90 days and meeting all other requirements  Disposition: Discharge home  Discharge (Date  Time): 07/21/2022; 1110 hrs.   Primary Care Physician: Lauro Regulus, MD Location: [facility] Note by: Oswaldo Done, MD Date: 07/21/2022; Time: 11:07 AM  Disclaimer:  Medicine is not an Visual merchandiser. The only guarantee in medicine is that nothing is  guaranteed. It is important to note that the decision to proceed with this intervention was based on the information collected from the patient. The Data and conclusions were drawn from the patient's questionnaire, the interview, and the physical examination. Because the information was provided in large part by the patient, it cannot be guaranteed that it has not been purposely or unconsciously manipulated. Every effort has been made to obtain as much relevant data as possible for this evaluation. It is important to note that the conclusions that lead to this procedure are derived in large part from the available data. Always take into account that the treatment will also be dependent on availability of resources and existing treatment guidelines, considered by other Pain Management Practitioners as being common knowledge and practice, at the time of the intervention. For Medico-Legal purposes, it is also important to point out that variation in procedural techniques and pharmacological choices are the acceptable norm. The indications, contraindications, technique, and results of the above procedure should only be interpreted and judged by a Board-Certified Interventional Pain Specialist with extensive familiarity and expertise in the same exact procedure and technique.

## 2022-07-19 DIAGNOSIS — F319 Bipolar disorder, unspecified: Secondary | ICD-10-CM | POA: Diagnosis not present

## 2022-07-19 DIAGNOSIS — E118 Type 2 diabetes mellitus with unspecified complications: Secondary | ICD-10-CM | POA: Diagnosis not present

## 2022-07-19 DIAGNOSIS — Z0000 Encounter for general adult medical examination without abnormal findings: Secondary | ICD-10-CM | POA: Diagnosis not present

## 2022-07-19 DIAGNOSIS — E7800 Pure hypercholesterolemia, unspecified: Secondary | ICD-10-CM | POA: Diagnosis not present

## 2022-07-19 DIAGNOSIS — G4733 Obstructive sleep apnea (adult) (pediatric): Secondary | ICD-10-CM | POA: Diagnosis not present

## 2022-07-19 DIAGNOSIS — Z23 Encounter for immunization: Secondary | ICD-10-CM | POA: Diagnosis not present

## 2022-07-19 DIAGNOSIS — E039 Hypothyroidism, unspecified: Secondary | ICD-10-CM | POA: Diagnosis not present

## 2022-07-20 DIAGNOSIS — H903 Sensorineural hearing loss, bilateral: Secondary | ICD-10-CM | POA: Diagnosis not present

## 2022-07-21 ENCOUNTER — Encounter: Payer: Self-pay | Admitting: Pain Medicine

## 2022-07-21 ENCOUNTER — Ambulatory Visit: Payer: No Typology Code available for payment source | Attending: Pain Medicine | Admitting: Pain Medicine

## 2022-07-21 ENCOUNTER — Ambulatory Visit
Admission: RE | Admit: 2022-07-21 | Discharge: 2022-07-21 | Disposition: A | Discharge status: Home / Self Care | Payer: No Typology Code available for payment source | Source: Ambulatory Visit | Attending: Pain Medicine | Admitting: Pain Medicine

## 2022-07-21 VITALS — BP 136/76 | HR 93 | Temp 97.0°F | Resp 19 | Ht 68.0 in | Wt 270.0 lb

## 2022-07-21 DIAGNOSIS — G894 Chronic pain syndrome: Secondary | ICD-10-CM | POA: Insufficient documentation

## 2022-07-21 DIAGNOSIS — M542 Cervicalgia: Secondary | ICD-10-CM | POA: Diagnosis not present

## 2022-07-21 DIAGNOSIS — R937 Abnormal findings on diagnostic imaging of other parts of musculoskeletal system: Secondary | ICD-10-CM | POA: Diagnosis not present

## 2022-07-21 DIAGNOSIS — R42 Dizziness and giddiness: Secondary | ICD-10-CM | POA: Diagnosis not present

## 2022-07-21 DIAGNOSIS — G8929 Other chronic pain: Secondary | ICD-10-CM | POA: Diagnosis not present

## 2022-07-21 DIAGNOSIS — M479 Spondylosis, unspecified: Secondary | ICD-10-CM | POA: Diagnosis not present

## 2022-07-21 DIAGNOSIS — M47812 Spondylosis without myelopathy or radiculopathy, cervical region: Secondary | ICD-10-CM | POA: Diagnosis not present

## 2022-07-21 MED ORDER — DEXAMETHASONE SODIUM PHOSPHATE 10 MG/ML IJ SOLN
10.0000 mg | Freq: Once | INTRAMUSCULAR | Status: AC
  Administered 2022-07-21: 10 mg
  Filled 2022-07-21: qty 1

## 2022-07-21 MED ORDER — ROPIVACAINE HCL 2 MG/ML IJ SOLN
9.0000 mL | Freq: Once | INTRAMUSCULAR | Status: AC
  Administered 2022-07-21: 20 mL via PERINEURAL
  Filled 2022-07-21: qty 20

## 2022-07-21 MED ORDER — PENTAFLUOROPROP-TETRAFLUOROETH EX AERO
INHALATION_SPRAY | Freq: Once | CUTANEOUS | Status: AC
  Administered 2022-07-21: 30 via TOPICAL

## 2022-07-21 MED ORDER — LIDOCAINE HCL 2 % IJ SOLN
20.0000 mL | Freq: Once | INTRAMUSCULAR | Status: AC
  Administered 2022-07-21: 100 mg
  Filled 2022-07-21: qty 20

## 2022-07-21 NOTE — Progress Notes (Signed)
Safety precautions to be maintained throughout the outpatient stay will include: orient to surroundings, keep bed in low position, maintain call bell within reach at all times, provide assistance with transfer out of bed and ambulation.  

## 2022-07-21 NOTE — Patient Instructions (Signed)
____________________________________________________________________________________________  Virtual Visits   ID our calls: Add these numbers to your list of contacts on your smart phone. Label it as "PAIN Management" Nursing: [phone] (Main) Dr. Donavyn Fecher: [phone]   What is a "Virtual Visit"? It is an electronic healthcare encounter (medical visit) that takes place on real time (NOT TEXT or E-MAIL) over the telephone or computer device (desktop, laptop, tablet, smart phone, etc.). It allows for more communication flexibility between the patient and the healthcare provider.  Who decides when these types of visits will be used? The physician.  Who is eligible for these types of visits? Only those patients that can be reliably reached over the telephone.  What do you mean by reliably? We do not have time to call everyone multiple times, therefore those patients that tend to screen calls and then call back later are not suitable candidates for this system. We all hate telemarketers and "Robocalls".  We understand how people are reluctant to pickup on calls from "unknown numbers", therefore, we suggest you add our numbers to your list of contacts. This way, you should be able to identify our calls. All of our numbers are available above.   Who is not eligible? This option is not appropriate for medication management. Patients on controlled substances have to come in for "Face-to-Face" encounters. Monitoring of these substances is mandatory. Virtual visits do not allow for unannounced drug screening tests or pill counts. Not bringing your pills, or the empty bottles, may result in no refill.  When will this type of visits be used? For follow-up after procedures on established patients, as long as they have had the same procedure done before.  Whenever you are physically unable to attend a regular appointment.   Can I request my medication visit to be "Virtual"? Yes. Available on  a limited basis, only if you are unable to physically attend your appointment. However, you may only receive a 30-day prescription. Abuse of this option may result in discontinuation of medication due to inability to properly monitor the therapy.  When will I be called?  You will receive an initial call from [phone], from our nursing staff, one business day prior to your appointment. (For Monday appointments you will be called on Friday.) The purpose of this call is to review your medications and the results of any recent procedure.  If the nursing staff is unable to make contact, your virtual encounter may be canceled and rescheduled to a face-to-face visit.  Your provider will call you on the day of your appointment.  At what time will I be called? Providers will call whenever there is time available. Do not expect calls at any specific time. On the schedule, you will have an appointment time assigned to you however, this is seldom accurate. This is done simply to keep a list of patients to be called. Be advised that calls may come at anytime during the day. Calls start as early as 8:00 AM and go as late as 8:00 PM. This will depend on provider availability. The system is not perfect. If this is inconvenient for you, please request to be changed to an "in-person" appointment.   ____________________________________________________________________________________________    ____________________________________________________________________________________________  Post-Procedure Discharge Instructions  Instructions: Apply ice:  Purpose: This will minimize any swelling and discomfort after procedure.  When: Day of procedure, as soon as you get home. How: Fill a plastic sandwich bag with crushed ice. Cover it with a small towel and apply to injection   site. How long: (15 min on, 15 min off) Apply for 15 minutes then remove x 15 minutes.  Repeat sequence on day of procedure, until you go to  bed. Apply heat:  Purpose: To treat any soreness and discomfort from the procedure. When: Starting the next day after the procedure. How: Apply heat to procedure site starting the day following the procedure. How long: May continue to repeat daily, until discomfort goes away. Food intake: Start with clear liquids (like water) and advance to regular food, as tolerated.  Physical activities: Keep activities to a minimum for the first 8 hours after the procedure. After that, then as tolerated. Driving: If you have received any sedation, be responsible and do not drive. You are not allowed to drive for 24 hours after having sedation. Blood thinner: (Applies only to those taking blood thinners) You may restart your blood thinner 6 hours after your procedure. Insulin: (Applies only to Diabetic patients taking insulin) As soon as you can eat, you may resume your normal dosing schedule. Infection prevention: Keep procedure site clean and dry. Shower daily and clean area with soap and water. Post-procedure Pain Diary: Extremely important that this be done correctly and accurately. Recorded information will be used to determine the next step in treatment. For the purpose of accuracy, follow these rules: Evaluate only the area treated. Do not report or include pain from an untreated area. For the purpose of this evaluation, ignore all other areas of pain, except for the treated area. After your procedure, avoid taking a long nap and attempting to complete the pain diary after you wake up. Instead, set your alarm clock to go off every hour, on the hour, for the initial 8 hours after the procedure. Document the duration of the numbing medicine, and the relief you are getting from it. Do not go to sleep and attempt to complete it later. It will not be accurate. If you received sedation, it is likely that you were given a medication that may cause amnesia. Because of this, completing the diary at a later time may  cause the information to be inaccurate. This information is needed to plan your care. Follow-up appointment: Keep your post-procedure follow-up evaluation appointment after the procedure (usually 2 weeks for most procedures, 6 weeks for radiofrequencies). DO NOT FORGET to bring you pain diary with you.   Expect: (What should I expect to see with my procedure?) From numbing medicine (AKA: Local Anesthetics): Numbness or decrease in pain. You may also experience some weakness, which if present, could last for the duration of the local anesthetic. Onset: Full effect within 15 minutes of injected. Duration: It will depend on the type of local anesthetic used. On the average, 1 to 8 hours.  From steroids (Applies only if steroids were used): Decrease in swelling or inflammation. Once inflammation is improved, relief of the pain will follow. Onset of benefits: Depends on the amount of swelling present. The more swelling, the longer it will take for the benefits to be seen. In some cases, up to 10 days. Duration: Steroids will stay in the system x 2 weeks. Duration of benefits will depend on multiple posibilities including persistent irritating factors. Side-effects: If present, they may typically last 2 weeks (the duration of the steroids). Frequent: Cramps (if they occur, drink Gatorade and take over-the-counter Magnesium 450-500 mg once to twice a day); water retention with temporary weight gain; increases in blood sugar; decreased immune system response; increased appetite. Occasional: Facial flushing (red, warm   cheeks); mood swings; menstrual changes. Uncommon: Long-term decrease or suppression of natural hormones; bone thinning. (These are more common with higher doses or more frequent use. This is why we prefer that our patients avoid having any injection therapies in other practices.)  Very Rare: Severe mood changes; psychosis; aseptic necrosis. From procedure: Some discomfort is to be expected once  the numbing medicine wears off. This should be minimal if ice and heat are applied as instructed.  Call if: (When should I call?) You experience numbness and weakness that gets worse with time, as opposed to wearing off. New onset bowel or bladder incontinence. (Applies only to procedures done in the spine)  Emergency Numbers: Durning business hours (Monday - Thursday, 8:00 AM - 4:00 PM) (Friday, 9:00 AM - 12:00 Noon): [phone] After hours: [phone] NOTE: If you are having a problem and are unable connect with, or to talk to a provider, then go to your nearest urgent care or emergency department. If the problem is serious and urgent, please call 911. ____________________________________________________________________________________________    

## 2022-07-22 ENCOUNTER — Telehealth: Payer: Self-pay

## 2022-07-22 DIAGNOSIS — R0683 Snoring: Secondary | ICD-10-CM | POA: Diagnosis not present

## 2022-07-22 DIAGNOSIS — R0602 Shortness of breath: Secondary | ICD-10-CM | POA: Diagnosis not present

## 2022-07-22 NOTE — Telephone Encounter (Signed)
Post procedure follow up.  LM 

## 2022-08-05 ENCOUNTER — Encounter: Payer: Self-pay | Admitting: Internal Medicine
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
## 2022-08-05 NOTE — Telephone Encounter (Signed)
Called and spoke with pt's spouse [name] Reichmann letting her know the recs per CY. [name] verbalized understanding. Pt's appt has been rescheduled to 1/29 as it was okay to use held slot per CY. Nothing further needed.

## 2022-08-05 NOTE — Progress Notes (Signed)
HPI M former smoker followed for OSA, Insomnia, complicated by HTN, LVH, Steatohepatitis, Hypercholesterolemia, Hypothyroid, DM2, Bipolar 1, Depression, Morbid Obesity,  PFT 05/24/22- WNL preliminary read ONOX on BIPAP with O2 2L done by [facility] on 07/18/22 showed sustained O2 sat in mid-80's. Doesn't qualify per Medicare guideline because not done during witnessed test on BIPAP. Split Sleep Study ([facility] 05/23/17)- AHI 105.7/ hr, desaturation to 72 %, CPAP to 16, body weight 300 lbs =====================================================================================  05/24/22-  33 yoM former smoker followed for OSA, Insomnia, complicated by HTN, LVH, Steatohepatitis, Hypercholesterolemia, Hypothyroid, DM2, Bipolar 1, Depression, Morbid Obesity,  -lorazepam 0.5 mg x 3 hs          -note lithium, Risperdal,  Split night study 11/15/21- BIPAP 18/14, PS 4, O2 2L. Replacement machine ordered  01/26/22/ [facility]                               //?cbc to r/o anemia?// Download compliance- 100%, AHI 1.6/ hr Body weight today-270 lbs Covid vax-3 Moderna Flu vax-had PFT 05/24/22- WNL preliminary read He had quit O2 for sleep "too loud". We will reassess with ONOX on BIPAP. Some cough if supine- he relates to acid reflux. Reflux measures reviewed. CXR 02/18/22- MPRESSION: Unremarkable radiographic appearance of the chest, no explanation for symptoms.  08/08/22- 65 yoM former smoker followed for OSA, hx UPPP, , Insomnia, complicated by HTN, LVH, Steatohepatitis, Hypercholesterolemia, Hypothyroid, DM2, Bipolar 1, Depression, Morbid Obesity,  -lorazepam 0.5 mg x 3 hs          -note lithium, Risperdal, Ativan,  Split night study 11/15/21- BIPAP 18/14, PS 4, O2 2L. Replacement machine ordered  01/26/22/ [facility]     Not wearing oxygen for sleep "too loud".                          Download compliance- 97%, AHI 6.4/ hr Body weight today-267 lbs Covid vax-3 Moderna Flu vax-had ONOX on BIPAP with O2  2L done by [facility] on 07/18/22 showed sustained O2 sat in mid-80's. Doesn't qualify per Medicare guideline because not done during witnessed test on BIPAP. Dr. Clovis Pu is treating with Ativan.  Says he is sleeping 3 hours then up for 4 hours then will nap.  Sleepwalking and active kicking.  He and wife are in separate bedrooms.  Complains that oxygen was too loud so he is not wearing it to sleep. Download reviewed.   ROS-see HPI  + = positive Constitutional:    +weight loss, night sweats, fevers, chills, fatigue, lassitude. HEENT:    headaches, difficulty swallowing, tooth/dental problems, sore throat,       sneezing, itching, ear ache, nasal congestion, post nasal drip, snoring CV:    chest pain, orthopnea, PND, swelling in lower extremities, anasarca,                dizziness, palpitations Resp:   +shortness of breath with exertion or at rest.                productive cough,   non-productive cough, coughing up of blood.              change in color of mucus.  wheezing.   Skin:    rash or lesions. GI:  No-   heartburn, indigestion, abdominal pain, nausea, vomiting, diarrhea,  change in bowel habits, loss of appetite GU: dysuria, change in color of urine, no urgency or frequency.   flank pain. MS:   joint pain, stiffness, decreased range of motion, back pain. Neuro-     nothing unusual Psych:  change in mood or affect.  depression or anxiety.   memory loss.  OBJ- Physical Exam General- Alert, Oriented, Affect-appropriate, Distress- none acute, +obese Skin- rash-none, lesions- none, excoriation- none Lymphadenopathy- none Head- atraumatic            Eyes- Gross vision intact, PERRLA, conjunctivae and secretions clear            Ears- Hearing, canals-normal            Nose- Clear, no-Septal dev, mucus, polyps, erosion, perforation             Throat- +S/P UPPP, Mallampati II , mucosa clear , drainage- none, tonsils- atrophic Neck- flexible , trachea midline, no stridor ,  thyroid nl, carotid no bruit Chest - symmetrical excursion , unlabored           Heart/CV- RRR , no murmur , no gallop  , no rub, nl s1 s2                           - JVD- none , edema- none, stasis changes- none, varices- none           Lung- + Shallow, clear to P&A, wheeze- none, cough- none , dullness-none, rub- none           Chest wall-  Abd-  Br/ Gen/ Rectal- Not done, not indicated Extrem- cyanosis- none, clubbing, none, atrophy- none, strength- nl Neuro- + I was he during our meeting.

## 2022-08-05 NOTE — Telephone Encounter (Signed)
Please see if we can get Carl Melton in sooner than scheduled- ok to use a held-spot.

## 2022-08-08 ENCOUNTER — Encounter: Payer: Self-pay | Admitting: Internal Medicine

## 2022-08-08 ENCOUNTER — Ambulatory Visit (INDEPENDENT_AMBULATORY_CARE_PROVIDER_SITE_OTHER): Payer: No Typology Code available for payment source | Admitting: Internal Medicine

## 2022-08-08 VITALS — BP 114/80 | HR 88 | Ht 68.0 in | Wt 267.5 lb

## 2022-08-08 DIAGNOSIS — G4734 Idiopathic sleep related nonobstructive alveolar hypoventilation: Secondary | ICD-10-CM | POA: Diagnosis not present

## 2022-08-08 DIAGNOSIS — F5101 Primary insomnia: Secondary | ICD-10-CM | POA: Diagnosis not present

## 2022-08-08 DIAGNOSIS — G4733 Obstructive sleep apnea (adult) (pediatric): Secondary | ICD-10-CM

## 2022-08-08 MED ORDER — ESZOPICLONE 3 MG PO TABS: ORAL_TABLET | ORAL | 0 refills | Status: DC

## 2022-08-08 NOTE — Progress Notes (Unsigned)
Patient: Carl Melton  Service Category: E/M  Provider: Gaspar Cola, MD  DOB: October 08, 1955  DOS: 08/09/2022  Location: Office  MRN: [id number]  Setting: Ambulatory outpatient  Referring Provider: Kirk Ruths, MD  Type: Established Patient  Specialty: Interventional Pain Management  PCP: Kirk Ruths, MD  Location: Remote location  Delivery: TeleHealth     Virtual Encounter - Pain Management PROVIDER NOTE: Information contained herein reflects review and annotations entered in association with encounter. Interpretation of such information and data should be left to medically-trained personnel. Information provided to patient can be located elsewhere in the medical record under "Patient Instructions". Document created using STT-dictation technology, any transcriptional errors that may result from process are unintentional.    Contact & Pharmacy Preferred: [phone] Home: [phone] (home) Mobile: [phone] (mobile) E-mail: [email or web address]  [id number] [place], [place] [address] [place] [address] Phone: [phone] Fax: [phone]   Pre-screening  Carl Melton offered "in-person" vs "virtual" encounter. He indicated preferring virtual for this encounter.   Reason COVID-19*  Social distancing based on CDC and AMA recommendations.   I contacted Carl Melton on 08/09/2022 via telephone.      I clearly identified myself as Gaspar Cola, MD. I verified that I was speaking with the correct person using two identifiers (Name: Carl Melton, and date of birth: [birth year]).  Consent I sought verbal advanced consent from Carl Melton for virtual visit interactions. I informed Carl Melton of possible security and privacy concerns, risks, and limitations associated with providing "not-in-person" medical evaluation and management services. I also informed Carl Melton of the availability of "in-person" appointments. Finally, I  informed him that there would be a charge for the virtual visit and that he could be  personally, fully or partially, financially responsible for it. [name] expressed understanding and agreed to proceed.   Historic Elements   Carl Melton is a 67 y.o. year old, male patient evaluated today after our last contact on 07/21/2022. Carl Melton  has a past medical history of Bipolar 1 disorder ([place]), Depression, Diabetes mellitus without complication ([place]), Dysrhythmia, Hyperlipidemia, Hypertension, Hypothyroidism, LVH (left ventricular hypertrophy), Morbid obesity ([place]), Sleep apnea, and Steatohepatitis. He also  has a past surgical history that includes Colonoscopy (01/04/2013, 08/04/2009); Esophagogastroduodenoscopy (03/21/2002); Uvulopalatopharyngoplasty (1997); Knee surgery; Appendectomy; Colonoscopy with propofol (N/A, 08/06/2018); and Colonoscopy with propofol (N/A, 05/11/2020). Carl Melton has a current medication list which includes the following prescription(s): dulaglutide, felodipine, levothyroxine, lisinopril, lithium carbonate, loratadine, lorazepam, metformin, methocarbamol, risperidone, tamsulosin, and xigduo xr, and the following Facility-Administered Medications: acetaminophen. He  reports that he quit smoking about 42 years ago. His smoking use included cigarettes. He started smoking about 48 years ago. He has a 12.00 pack-year smoking history. He has never used smokeless tobacco. He reports current alcohol use of about 4.0 standard drinks of alcohol per week. He reports that he does not use drugs. Carl Melton is allergic to erythromycin ethylsuccinate and simvastatin.  Estimated body mass index is 41.05 kg/m as calculated from the following:   Height as of 07/21/22: 5\' 8"  (1.727 m).   Weight as of 07/21/22: 270 lb (122.5 kg).  HPI  Today, he is being contacted for a post-procedure assessment.  Post-procedure evaluation   Type: Cervical Facet Medial Branch Block(s) #2  Laterality: Right   Level: TON, C3, and C4 Medial Branch Level(s). Injecting these levels blocks the C2-3 and C3-4 cervical facet joints.  Imaging: Fluoroscopic guidance Anesthesia:  Local anesthesia (1-2% Lidocaine) Anxiolysis: None Sedation: No Sedation                       DOS: 07/21/2022  Performed by: Gaspar Cola, MD  Purpose: Diagnostic/Therapeutic Indications: Cervicalgia (cervical spine axial pain) severe enough to impact quality of life or function. 1. Cervical facet syndrome   2. Spondylosis without myelopathy or radiculopathy, cervical region   3. Cervical facet arthropathy (Multilevel) (Bilateral)   4. Cervical facet hypertrophy (Multilevel) (Bilateral)   5. Chronic neck pain (1ry area of Pain) (Bilateral) (R>L)   6. Cervicalgia   7. Vertigo of cervical arthrosis syndrome   8. Abnormal MRI, cervical spine (10/26/2021)    NAS-11 Pain score:   Pre-procedure: 5 /10   Post-procedure: 8 /10      Effectiveness:  Initial hour after procedure:   ***. Subsequent 4-6 hours post-procedure:   ***. Analgesia past initial 6 hours:   ***. Ongoing improvement:  Analgesic:  *** Function:    ***    ROM:    ***      Pharmacotherapy Assessment   Opioid Analgesic: None.  MME/day: 0 mg/day   Monitoring: [place] PMP: PDMP reviewed during this encounter.       Pharmacotherapy: No side-effects or adverse reactions reported. Compliance: No problems identified. Effectiveness: Clinically acceptable. Plan: Refer to "POC". UDS:  Summary  Date Value Ref Range Status  03/31/2022 Note  Final    Comment:    ==================================================================== Compliance Drug Analysis, Ur ==================================================================== Test                             Result       Flag       Units  Drug Present and Declared for Prescription Verification   Lorazepam                      543          EXPECTED   ng/mg creat    Source of lorazepam is a scheduled  prescription medication.  Drug Absent but Declared for Prescription Verification   Risperidone                    Not Detected UNEXPECTED ==================================================================== Test                      Result    Flag   Units      Ref Range   Creatinine              30               mg/dL      >=20 ==================================================================== Declared Medications:  The flagging and interpretation on this report are based on the  following declared medications.  Unexpected results may arise from  inaccuracies in the declared medications.   **Note: The testing scope of this panel includes these medications:   Lorazepam (Ativan)  Risperidone (Risperdal)   **Note: The testing scope of this panel does not include the  following reported medications:   Dulaglutide  Felodipine (Plendil)  Levothyroxine (Synthroid)  Lisinopril (Zestril)  Lithium  Loratadine (Claritin)  Metformin  Tamsulosin (Flomax) ==================================================================== For clinical consultation, please call [phone]. ====================================================================    No results found for: "CBDTHCR", "D8THCCBX", "D9THCCBX"   Laboratory Chemistry Profile   Renal Lab Results  Component Value Date   BUN 16 03/31/2022  CREATININE 1.06 03/31/2022   BCR 15 03/31/2022   GFRAA 84 01/06/2020   GFRNONAA 73 01/06/2020    Hepatic Lab Results  Component Value Date   AST 8 03/31/2022   ALBUMIN 5.0 (H) 03/31/2022   ALKPHOS 57 03/31/2022    Electrolytes Lab Results  Component Value Date   NA 138 03/31/2022   K 4.5 03/31/2022   CL 102 03/31/2022   CALCIUM 9.7 03/31/2022   MG 2.0 03/31/2022    Bone Lab Results  Component Value Date   25OHVITD1 13 (L) 03/31/2022   25OHVITD2 <1.0 03/31/2022   25OHVITD3 13 03/31/2022    Inflammation (CRP: Acute Phase) (ESR: Chronic Phase) Lab Results  Component  Value Date   CRP <1 03/31/2022   ESRSEDRATE 16 03/31/2022         Note: Above Lab results reviewed.  Imaging  DG PAIN CLINIC C-ARM 1-60 MIN NO REPORT Fluoro was used, but no Radiologist interpretation will be provided.  Please refer to "NOTES" tab for provider progress note.  Assessment  There were no encounter diagnoses.  Plan of Care  Problem-specific:  No problem-specific Assessment & Plan notes found for this encounter.  Mr. Paxten Appelt Zappulla has a current medication list which includes the following long-term medication(s): felodipine, levothyroxine, lisinopril, lithium carbonate, metformin, and risperidone.  Pharmacotherapy (Medications Ordered): No orders of the defined types were placed in this encounter.  Orders:  No orders of the defined types were placed in this encounter.  Follow-up plan:   No follow-ups on file.     Interventional Therapies  Risk  Complexity Considerations:      WNL   Planned  Pending:   Diagnostic right C2-3, C3-4 Facet joints inj. #1    Under consideration:   Diagnostic right vs bilateral cervical facet MBB #1  Surgery discussed by [facility], a C3 to C4 ACDF offered.  (01/07/2022)    Completed:   None at this time   Completed by other providers:   Although the actual procedure notes are unavailable, there are references to an interlaminar cervical epidural steroid injection at the C7-T1 level which was apparently done on 10/27/2021.  This was followed by a left C4 selective nerve root epidural steroid injection on 12/10/2021.   Therapeutic  Palliative (PRN) options:   None established        Recent Visits Date Type Provider Dept  07/21/22 Procedure visit Delano Metz, MD [facility]  06/30/22 Office Visit Delano Metz, MD [facility]  06/21/22 Procedure visit Delano Metz, MD [facility]  05/23/22 Office Visit Delano Metz, MD [facility]  Showing recent visits  within past 90 days and meeting all other requirements Future Appointments Date Type Provider Dept  08/09/22 Appointment Delano Metz, MD [facility]  Showing future appointments within next 90 days and meeting all other requirements  I discussed the assessment and treatment plan with the patient. The patient was provided an opportunity to ask questions and all were answered. The patient agreed with the plan and demonstrated an understanding of the instructions.  Patient advised to call back or seek an in-person evaluation if the symptoms or condition worsens.  Duration of encounter: *** minutes.  Note by: Oswaldo Done, MD Date: 08/09/2022; Time: 12:10 PM

## 2022-08-08 NOTE — Patient Instructions (Signed)
Script sent to try lunesta for sleep instead of lorazepam (Ativan). See if it lasts longer.  We will track down the results of your overnight oxygen test.

## 2022-08-09 ENCOUNTER — Ambulatory Visit: Payer: No Typology Code available for payment source | Attending: Pain Medicine | Admitting: Pain Medicine

## 2022-08-09 ENCOUNTER — Telehealth: Payer: Self-pay

## 2022-08-09 ENCOUNTER — Other Ambulatory Visit: Payer: Self-pay | Admitting: Psychiatry

## 2022-08-09 DIAGNOSIS — F319 Bipolar disorder, unspecified: Secondary | ICD-10-CM

## 2022-08-09 DIAGNOSIS — R42 Dizziness and giddiness: Secondary | ICD-10-CM | POA: Diagnosis not present

## 2022-08-09 DIAGNOSIS — G894 Chronic pain syndrome: Secondary | ICD-10-CM

## 2022-08-09 DIAGNOSIS — M542 Cervicalgia: Secondary | ICD-10-CM | POA: Diagnosis not present

## 2022-08-09 DIAGNOSIS — M479 Spondylosis, unspecified: Secondary | ICD-10-CM

## 2022-08-09 DIAGNOSIS — M47812 Spondylosis without myelopathy or radiculopathy, cervical region: Secondary | ICD-10-CM

## 2022-08-09 NOTE — Telephone Encounter (Signed)
LM for patient to call office

## 2022-08-11 ENCOUNTER — Telehealth: Payer: Self-pay | Admitting: Internal Medicine

## 2022-08-11 MED ORDER — ZOLPIDEM TARTRATE 10 MG PO TABS: 10.0000 mg | ORAL_TABLET | Freq: Every evening | ORAL | 5 refills | Status: DC | PRN

## 2022-08-11 NOTE — Telephone Encounter (Signed)
Since insurance won't cover eszopiclone, I have sent script for their suggested alternative, zolpidem. Try 1 at bedtime for sleep.

## 2022-08-11 NOTE — Telephone Encounter (Signed)
Fax received from pt's pharmacy in regards to eszoplicone 3mg  tablet. Reason for the request is for an alternative to be requested.  Suggested alternatives inc lude zolpidem tartrate 10mg  tablet.  Dr. Annamaria Boots, please advise on this.   Allergies  Allergen Reactions   Erythromycin Ethylsuccinate Rash   Simvastatin Other (See Comments) and Rash     Current Outpatient Medications:    Dulaglutide (TRULICITY [place]), Inject into the skin., Disp: , Rfl:    Eszopiclone 3 MG TABS, 1 at bedtime for sleep, Disp: 30 tablet, Rfl: 0   felodipine (PLENDIL) 5 MG 24 hr tablet, 5 mg daily. , Disp: , Rfl: 4   levothyroxine (SYNTHROID, LEVOTHROID) 88 MCG tablet, Take 100 mcg by mouth daily., Disp: , Rfl: 5   lisinopril (PRINIVIL,ZESTRIL) 20 MG tablet, Take 20 mg by mouth daily., Disp: , Rfl: 5   lithium carbonate (ESKALITH) 450 MG ER tablet, 1 AND 1/2 TABLETS IN THE MORNING AND 2 TABLETS AT NIGHT, Disp: 105 tablet, Rfl: 0   loratadine (CLARITIN) 10 MG tablet, Take by mouth., Disp: , Rfl:    LORazepam (ATIVAN) 0.5 MG tablet, TAKE 3 TABLETS (1.5 MG TOTAL) BY MOUTH AT BEDTIME., Disp: 90 tablet, Rfl: 2   metFORMIN (GLUCOPHAGE-XR) 500 MG 24 hr tablet, Take 1,500 mg by mouth daily., Disp: , Rfl: 5   methocarbamol (ROBAXIN) 500 MG tablet, take 1 tablet at bed time as needed, Disp: , Rfl:    risperiDONE (RISPERDAL) 1 MG tablet, TAKE 1 TABLET BY MOUTH EVERYDAY AT BEDTIME, Disp: 90 tablet, Rfl: 0   tamsulosin (FLOMAX) 0.4 MG CAPS capsule, Take 0.4 mg by mouth., Disp: , Rfl:    XIGDUO XR 04-999 MG TB24, Take 1 tablet by mouth every morning., Disp: , Rfl:   Current Facility-Administered Medications:    acetaminophen (TYLENOL) tablet 500 mg, 500 mg, Oral, Once, Ratcliffe, Heather R, PA-C

## 2022-08-11 NOTE — Telephone Encounter (Signed)
Attempted to call pt but unable to reach. Left message to return call.  

## 2022-08-12 ENCOUNTER — Telehealth: Payer: Self-pay | Admitting: Internal Medicine

## 2022-08-12 NOTE — Telephone Encounter (Signed)
Attempted to call pt but unable to reach. Left pt a detailed message letting him know that Rx for zolpidem was sent to pharmacy in place of eszoplicone due to insurance not wanting to cover prior med sent. Nothing further needed.

## 2022-08-12 NOTE — Telephone Encounter (Signed)
Called and spoke with pt's spouse Caren Griffins letting her know the info about Rx that was sent for pt and she verbalized understanding. Nothing further needed.

## 2022-08-17 DIAGNOSIS — G4733 Obstructive sleep apnea (adult) (pediatric): Secondary | ICD-10-CM | POA: Diagnosis not present

## 2022-08-18 DIAGNOSIS — E669 Obesity, unspecified: Secondary | ICD-10-CM | POA: Diagnosis not present

## 2022-08-18 DIAGNOSIS — I152 Hypertension secondary to endocrine disorders: Secondary | ICD-10-CM | POA: Diagnosis not present

## 2022-08-18 DIAGNOSIS — E1159 Type 2 diabetes mellitus with other circulatory complications: Secondary | ICD-10-CM | POA: Diagnosis not present

## 2022-08-18 DIAGNOSIS — E1169 Type 2 diabetes mellitus with other specified complication: Secondary | ICD-10-CM | POA: Diagnosis not present

## 2022-08-18 DIAGNOSIS — E1165 Type 2 diabetes mellitus with hyperglycemia: Secondary | ICD-10-CM | POA: Diagnosis not present

## 2022-08-18 DIAGNOSIS — E785 Hyperlipidemia, unspecified: Secondary | ICD-10-CM | POA: Diagnosis not present

## 2022-08-18 DIAGNOSIS — E039 Hypothyroidism, unspecified: Secondary | ICD-10-CM | POA: Diagnosis not present

## 2022-08-22 DIAGNOSIS — R0683 Snoring: Secondary | ICD-10-CM | POA: Diagnosis not present

## 2022-08-22 DIAGNOSIS — R0602 Shortness of breath: Secondary | ICD-10-CM | POA: Diagnosis not present

## 2022-08-29 ENCOUNTER — Ambulatory Visit: Payer: No Typology Code available for payment source | Admitting: Internal Medicine

## 2022-09-02 ENCOUNTER — Encounter: Payer: Self-pay | Admitting: Internal Medicine

## 2022-09-02 NOTE — Assessment & Plan Note (Signed)
He is working with Dr. Clovis Pu. Plan-we will let him try Lunesta 3 mg to consolidate sleep.  May need to retry clonazepam for longer half-life.  He is on appropriate sleep hygiene.

## 2022-09-02 NOTE — Assessment & Plan Note (Signed)
Compliant with oxygen prescribed for sleep because concentrator is too loud.  We will review most recent oxygen saturation and consider options.

## 2022-09-11 NOTE — Progress Notes (Signed)
HPI M former smoker followed for OSA, Insomnia, complicated by HTN, LVH, Steatohepatitis, Hypercholesterolemia, Hypothyroid, DM2, Bipolar 1, Depression, Morbid Obesity,  PFT 05/24/22- WNL preliminary read ONOX on BIPAP with O2 2L done by [facility] on 07/18/22 showed sustained O2 sat in mid-80's. Doesn't qualify per Medicare guideline because not done during witnessed test on BIPAP. Split Sleep Study ([facility] 05/23/17)- AHI 105.7/ hr, desaturation to 72 %, CPAP to 16, body weight 300 lbs =====================================================================================  08/08/22- 58 yoM former smoker followed for OSA, hx UPPP, , Insomnia, complicated by HTN, LVH, Steatohepatitis, Hypercholesterolemia, Hypothyroid, DM2, Bipolar 1, Depression, Morbid Obesity,  -lorazepam 0.5 mg x 3 hs          -note lithium, Risperdal, Ativan,  Split night study 11/15/21- BIPAP 18/14, PS 4, O2 2L. Replacement machine ordered  01/26/22/ [facility]     Not wearing oxygen for sleep "too loud".                          Download compliance- 97%, AHI 6.4/ hr Body weight today-267 lbs Covid vax-3 Moderna Flu vax-had ONOX on BIPAP with O2 2L done by [facility] on 07/18/22 showed sustained O2 sat in mid-80's. Doesn't qualify per Medicare guideline because not done during witnessed test on BIPAP. Dr. Clovis Pu is treating with Ativan.  Says he is sleeping 3 hours then up for 4 hours then will nap.  Sleepwalking and active kicking.  He and wife are in separate bedrooms.  Complains that oxygen was too loud so he is not wearing it to sleep. Download reviewed.  09/13/22- 72 yoM former smoker followed for OSA, hx UPPP,  Insomnia, complicated by HTN, LVH, Steatohepatitis, Hypercholesterolemia, Hypothyroid, DM2, Bipolar 1, Depression, Morbid Obesity,  -lorazepam 0.5 mg x 3 hs          -note lithium, Risperdal, Ativan,  BIPAP 18/14, PS 4, O2 2L. Replacement machine ordered  01/26/22/ [facility]     Not wearing oxygen for sleep "too loud".                           Download compliance- 100%, AHI 4.1/ hr ONOX on BIPAP 07/19/22- 2 hr 39 minutes </= 88% ( Doesn't qualify for Rx O2 because done at home, not in-center) Body weight today-266 lbs Covid vax-3 Moderna                                    Wife here Flu vax-had Download reviewed recognizing good compliance and control with BiPAP. He has home oxygen but not wearing it because concentrator is too loud.  I suggested moving it out of the bedroom. We discussed sleep aids.  Sleep is interrupted by bladder spasms being evaluated by urology but he would like something to help consolidate sleep otherwise.  We discussed this.  Previously failed Ambien and lorazepam.  ROS-see HPI  + = positive Constitutional:    +weight loss, night sweats, fevers, chills, fatigue, lassitude. HEENT:    headaches, difficulty swallowing, tooth/dental problems, sore throat,       sneezing, itching, ear ache, nasal congestion, post nasal drip, snoring CV:    chest pain, orthopnea, PND, swelling in lower extremities, anasarca,                dizziness, palpitations Resp:   +shortness of breath with exertion or at rest.  productive cough,   non-productive cough, coughing up of blood.              change in color of mucus.  wheezing.   Skin:    rash or lesions. GI:  No-   heartburn, indigestion, abdominal pain, nausea, vomiting, diarrhea,                 change in bowel habits, loss of appetite GU: dysuria, change in color of urine, no urgency or frequency.   flank pain. MS:   joint pain, stiffness, decreased range of motion, back pain. Neuro-     nothing unusual Psych:  change in mood or affect.  depression or anxiety.   memory loss.  OBJ- Physical Exam General- Alert, Oriented, Affect-appropriate, Distress- none acute, +obese Skin- rash-none, lesions- none, excoriation- none Lymphadenopathy- none Head- atraumatic            Eyes- Gross vision intact, PERRLA, conjunctivae and secretions  clear            Ears- Hearing, canals-normal            Nose- Clear, no-Septal dev, mucus, polyps, erosion, perforation             Throat- +S/P UPPP, Mallampati II , mucosa clear , drainage- none, tonsils- atrophic Neck- flexible , trachea midline, no stridor , thyroid nl, carotid no bruit Chest - symmetrical excursion , unlabored           Heart/CV- RRR , no murmur , no gallop  , no rub, nl s1 s2                           - JVD- none , edema- none, stasis changes- none, varices- none           Lung- + Shallow, clear to P&A, wheeze- none, cough- none , dullness-none, rub- none           Chest wall-  Abd-  Br/ Gen/ Rectal- Not done, not indicated Extrem- cyanosis- none, clubbing, none, atrophy- none, strength- nl Neuro-

## 2022-09-12 ENCOUNTER — Encounter: Payer: Self-pay | Admitting: Urology

## 2022-09-12 ENCOUNTER — Ambulatory Visit (INDEPENDENT_AMBULATORY_CARE_PROVIDER_SITE_OTHER): Payer: Medicare HMO | Admitting: Urology

## 2022-09-12 VITALS — BP 98/62 | HR 89 | Ht 68.0 in | Wt 262.0 lb

## 2022-09-12 DIAGNOSIS — R32 Unspecified urinary incontinence: Secondary | ICD-10-CM | POA: Diagnosis not present

## 2022-09-12 LAB — URINALYSIS, COMPLETE
Bilirubin, UA: NEGATIVE
Ketones, UA: NEGATIVE
Leukocytes,UA: NEGATIVE
Nitrite, UA: NEGATIVE
Protein,UA: NEGATIVE
RBC, UA: NEGATIVE
Specific Gravity, UA: 1.01 (ref 1.005–1.030)
Urobilinogen, Ur: 0.2 mg/dL (ref 0.2–1.0)
pH, UA: 6 (ref 5.0–7.5)

## 2022-09-12 LAB — BLADDER SCAN AMB NON-IMAGING

## 2022-09-12 LAB — MICROSCOPIC EXAMINATION

## 2022-09-12 MED ORDER — MIRABEGRON ER 50 MG PO TB24: 50.0000 mg | ORAL_TABLET | Freq: Every day | ORAL | 11 refills | Status: DC

## 2022-09-12 NOTE — Patient Instructions (Signed)

## 2022-09-12 NOTE — Progress Notes (Signed)
09/12/2022 1:45 PM   Carl Melton 10/16/1955 IN:[id number]  Referring provider: Kirk Ruths, MD [facility] [place],  [place] [address]  No chief complaint on file.   HPI: I was consulted to assess the patient's lower urinary tract symptoms.  He has urge incontinence sometimes associated with discomfort.  He changes underwear or pads on a bad day 4 times a day damp to moderately wet.  This last week he did not have to change his pad system.  He sometimes has mild bedwetting but not every night.  He can void every hour and cannot hold it for 2 hours.  He gets up 4 times at night with no ankle edema  His flow was slow.  He does not feel empty.  He feels an urge and only voids a small amount.  His med list said Flomax but I do not think he is currently taking it  He is on oral hypoglycemics.  He may have had 1 bladder infection in the past  No previous bladder surgery kidney stones.  Movements normal   PMH: Past Medical History:  Diagnosis Date   Bipolar 1 disorder ([place])    Depression    Diabetes mellitus without complication ([place])    type 2   Dysrhythmia    Hyperlipidemia    Hypertension    Hypothyroidism    LVH (left ventricular hypertrophy)    Morbid obesity ([facility])    Sleep apnea    on CPAP   Steatohepatitis     Surgical History: Past Surgical History:  Procedure Laterality Date   APPENDECTOMY     COLONOSCOPY  01/04/2013, 08/04/2009   COLONOSCOPY WITH PROPOFOL N/A 08/06/2018   Procedure: COLONOSCOPY WITH PROPOFOL;  Surgeon: Lollie Sails, MD;  Location: [facility] ENDOSCOPY;  Service: Endoscopy;  Laterality: N/A;   COLONOSCOPY WITH PROPOFOL N/A 05/11/2020   Procedure: COLONOSCOPY WITH PROPOFOL;  Surgeon: [name], MD;  Location: [facility] ENDOSCOPY;  Service: Endoscopy;  Laterality: N/A;   ESOPHAGOGASTRODUODENOSCOPY  03/21/2002   KNEE SURGERY     UVULOPALATOPHARYNGOPLASTY  1997    Home Medications:  Allergies as of  09/12/2022       Reactions   Erythromycin Ethylsuccinate Rash   Simvastatin Other (See Comments), Rash        Medication List        Accurate as of September 12, 2022  1:45 PM. If you have any questions, ask your nurse or doctor.          felodipine 5 MG 24 hr tablet Commonly known as: PLENDIL 5 mg daily.   levothyroxine 88 MCG tablet Commonly known as: SYNTHROID Take 100 mcg by mouth daily.   lisinopril 20 MG tablet Commonly known as: ZESTRIL Take 20 mg by mouth daily.   lithium carbonate 450 MG ER tablet Commonly known as: ESKALITH 1 AND 1/2 TABLETS IN THE MORNING AND 2 TABLETS AT NIGHT   loratadine 10 MG tablet Commonly known as: CLARITIN Take by mouth.   LORazepam 0.5 MG tablet Commonly known as: ATIVAN TAKE 3 TABLETS (1.5 MG TOTAL) BY MOUTH AT BEDTIME.   metFORMIN 500 MG 24 hr tablet Commonly known as: GLUCOPHAGE-XR Take 1,500 mg by mouth daily.   methocarbamol 500 MG tablet Commonly known as: ROBAXIN take 1 tablet at bed time as needed   risperiDONE 1 MG tablet Commonly known as: RISPERDAL TAKE 1 TABLET BY MOUTH EVERYDAY AT BEDTIME   tamsulosin 0.4 MG Caps capsule Commonly  known as: FLOMAX Take 0.4 mg by mouth.   TRULICITY [place] Inject into the skin.   Xigduo XR 04-999 MG Tb24 Generic drug: Dapagliflozin Pro-metFORMIN ER Take 1 tablet by mouth every morning.   zolpidem 10 MG tablet Commonly known as: AMBIEN Take 1 tablet (10 mg total) by mouth at bedtime as needed for sleep.        Allergies:  Allergies  Allergen Reactions   Erythromycin Ethylsuccinate Rash   Simvastatin Other (See Comments) and Rash    Family History: No family history on file.  Social History:  reports that he quit smoking about 42 years ago. His smoking use included cigarettes. He started smoking about 48 years ago. He has a 12.00 pack-year smoking history. He has never used smokeless tobacco. He reports current alcohol use of about 4.0 standard drinks of alcohol per  week. He reports that he does not use drugs.  ROS:                                        Physical Exam: There were no vitals taken for this visit.  Constitutional:  Alert and oriented, No acute distress. HEENT: [place] AT, moist mucus membranes.  Trachea midline, no masses. Cardiovascular: No clubbing, cyanosis, or edema. Respiratory: Normal respiratory effort, no increased work of breathing. GI: Abdomen is soft, nontender, nondistended, no abdominal masses GU: Concealed penis with obesity and a negative cough test.  Normal genitalia otherwise.  Small 40 g benign prostate Skin: No rashes, bruises or suspicious lesions. Lymph: No cervical or inguinal adenopathy. Neurologic: Grossly intact, no focal deficits, moving all 4 extremities. Psychiatric: Normal mood and affect.  Laboratory Data: No results found for: "WBC", "HGB", "HCT", "MCV", "PLT"  Lab Results  Component Value Date   CREATININE 1.06 03/31/2022    No results found for: "PSA"  No results found for: "TESTOSTERONE"  No results found for: "HGBA1C"  Urinalysis No results found for: "COLORURINE", "APPEARANCEUR", "LABSPEC", "PHURINE", "GLUCOSEU", "HGBUR", "BILIRUBINUR", "KETONESUR", "PROTEINUR", "UROBILINOGEN", "NITRITE", "LEUKOCYTESUR"  Pertinent Imaging: Urine negative but sent for culture.  Chart reviewed  Postvoid residual 0 mL  Assessment & Plan: Patient has urge incontinence poor flow and nocturia.  He has been on and off Flomax.  I thought I would try him on Myrbetriq and have him come back for a residual and cystoscopy.  Likely he will need urodynamics in the future.  Call if urine culture positive.  I mention urodynamics but did not go through it in detail    There are no diagnoses linked to this encounter.  No follow-ups on file.  Reece Packer, MD  [place] [address], [place],  [phone]

## 2022-09-13 ENCOUNTER — Encounter: Payer: Self-pay | Admitting: Internal Medicine

## 2022-09-13 ENCOUNTER — Ambulatory Visit (INDEPENDENT_AMBULATORY_CARE_PROVIDER_SITE_OTHER): Payer: Medicare HMO | Admitting: Internal Medicine

## 2022-09-13 VITALS — BP 110/74 | HR 87 | Ht 68.0 in | Wt 266.0 lb

## 2022-09-13 DIAGNOSIS — G4734 Idiopathic sleep related nonobstructive alveolar hypoventilation: Secondary | ICD-10-CM | POA: Diagnosis not present

## 2022-09-13 DIAGNOSIS — F5101 Primary insomnia: Secondary | ICD-10-CM

## 2022-09-13 DIAGNOSIS — G4733 Obstructive sleep apnea (adult) (pediatric): Secondary | ICD-10-CM | POA: Diagnosis not present

## 2022-09-13 MED ORDER — ESZOPICLONE 3 MG PO TABS: 3.0000 mg | ORAL_TABLET | Freq: Every day | ORAL | 5 refills | Status: DC

## 2022-09-13 NOTE — Patient Instructions (Signed)
Script sent to try Lunesta 3 mg at bedtime for sleep  Try again adding oxygen 2L through your BIPAP machine. You can try moving the oxygen concentrator out of the room to reduce the noise.  We can continue BIPAP 18/14

## 2022-09-15 LAB — CULTURE, URINE COMPREHENSIVE

## 2022-09-18 ENCOUNTER — Other Ambulatory Visit: Payer: Self-pay | Admitting: Psychiatry

## 2022-09-18 DIAGNOSIS — F319 Bipolar disorder, unspecified: Secondary | ICD-10-CM

## 2022-09-18 NOTE — Telephone Encounter (Signed)
Has appt 3/13

## 2022-09-21 ENCOUNTER — Ambulatory Visit (INDEPENDENT_AMBULATORY_CARE_PROVIDER_SITE_OTHER): Payer: Medicare HMO | Admitting: Psychiatry

## 2022-09-21 ENCOUNTER — Encounter: Payer: Self-pay | Admitting: Psychiatry

## 2022-09-21 DIAGNOSIS — F319 Bipolar disorder, unspecified: Secondary | ICD-10-CM | POA: Diagnosis not present

## 2022-09-21 DIAGNOSIS — G4733 Obstructive sleep apnea (adult) (pediatric): Secondary | ICD-10-CM

## 2022-09-21 DIAGNOSIS — E538 Deficiency of other specified B group vitamins: Secondary | ICD-10-CM | POA: Diagnosis not present

## 2022-09-21 DIAGNOSIS — F5105 Insomnia due to other mental disorder: Secondary | ICD-10-CM

## 2022-09-21 DIAGNOSIS — G4734 Idiopathic sleep related nonobstructive alveolar hypoventilation: Secondary | ICD-10-CM

## 2022-09-21 MED ORDER — RISPERIDONE 1 MG PO TABS: 1.0000 mg | ORAL_TABLET | Freq: Every day | ORAL | 1 refills | Status: DC

## 2022-09-21 MED ORDER — LITHIUM CARBONATE ER 450 MG PO TBCR: EXTENDED_RELEASE_TABLET | ORAL | 1 refills | Status: DC

## 2022-09-21 NOTE — Progress Notes (Signed)
Carl Melton Carl Melton Melton IN:[id number] 1955/09/15 67 y.o.  Subjective:   Patient ID:  Carl Melton Carl Melton Melton is a 67 y.o. (DOB 10-06-1955) male.  Chief Complaint:  Chief Complaint  Patient presents with   Follow-up    HPI Carl Melton Carl Melton Melton presents to the office today for follow-up of bipolar disorder and sleep.    seen December 2020.  No meds were changed.  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    01/06/2020 appointment with the following noted: Continues lithium 450 mg 3 and 1/2 tablets daily, lorazepam 1.5 mg HS, Risperdal 1 mg HS. Vaccinated. OK overall.  Doing job well with good reports but getting harder to manage multiple pieces.  Want to work 2 more years after this one.  Has good assistants.  Has good rapport with coworkers.  Mood been OK.  Only thing he notices is tremor with fine motor things at times.  OK typing.  Done well with work.  Has been able to keep going forward.  Would like to retire sooner than 66 but not sure.  Would stay busy with retirement.   Sleep is pretty good and naps after work.  OK during the day.  Has to get up anyway bc nocturia and now having trouble going back too sleep for the last 3-4 mos. 2 nights out of the week. Most of the time feeling good.  No significant mood swings No unusual fear.  Sleep is better witn increase in lorazepam without awakening.  Active.  Patient reports stable mood and denies depressed.  Patient denies difficulty with sleep initiation. Denies appetite disturbance.  Patient reports that energy and motivation have been good.  Patient denies any difficulty with concentration.  Patient denies any suicidal ideation.  Physical health stable. Plan; check lithium level. No med changes  07/07/2020 appointment with the following noted:  Still undecided about retirement but probably a couple of years more.  Job is going OK. Occ a little agitated but not manic and no problems with it.  Dealing with  personnel sometimes can be stressful.  More of it in the last year, bc of a couple of key people leaving but it's better now. No mood swings.   Not sleeping great.  DM is a problem causing nocturia 4-5 times nightly. No sig SE lithium. Patient reports stable mood and denies depressed or irritable moods.  Patient denies any recent difficulty with anxiety. Denies appetite disturbance.  Patient reports that energy and motivation have been good.  Patient denies any difficulty with concentration.  Patient denies any suicidal ideation. Plan: for nocutria switch lithium to 900 mg AM and  1 and 1/2 of 450 mg tablets at night.  12/30/2020 appointment with the following noted: Fine with meds but some trouble sleeping but thinks it's stress.  [place] and [place] up once to urinate.  Home up 2-3 times nightly. Wonders if he has beginning PD with tremor and slower and stiffer. Worries too much.  Patient reports stable mood and denies depressed or irritable moods.  Patient denies any recent difficulty with anxiety.  Denies appetite disturbance.  Patient reports that energy and motivation have been good.  Patient denies any difficulty with concentration.  Patient denies any suicidal ideation. Plan:  No change indicated except for nocutria switch lithium to 900 mg AM and  1 and 1/2 of 450 mg tablets at night. Disc diagnosing PD vs EPS from risperidone.  Disc risk of lowering the dose.  Reduce risperidone to 1/2 of 1 mg HS to see if Parkinsonism is better  03/29/21 appt noted: For 2 weeks on lower risperidone OK and then depressed.  Increased to 1 mg HS and in 2 days he felt better. Tremor, slowness did not improve when reduced risperidone. Saw PCP thinks it's not PD Postional vertigo with head movment.  Facial paresthesias. Nocturia unchanged.  Asks about meds for it. No mood swings.   Patient reports stable mood and denies depressed or irritable moods.  Patient denies any recent difficulty with  anxiety.  Patient denies difficulty with sleep initiation or maintenance. Denies appetite disturbance.  Patient reports that energy and motivation have been good.  Patient denies any difficulty with concentration.  Patient denies any suicidal ideation. Tremor unchanged. Plan: Check lithium level Check B12 for paresthesias. Continue lithium 900 mg every morning and 1-1/2 of the 450 mg tablets at night Continue risperidone 1 mg nightly  08/31/21 appt .   Pain in neck interfering.  On 3rd doc now.  Pending MRI.  Aggrivatiing. PT didn't help nor chiropracter.  Seeing ortho now. Sleeping problem.  To bed 830 and awake 0200 and back to bed after 30 min. Mood is ok.  No fear nor anxiety. Some daytime drowsiness.  Checking into new CPAP 12/17/21 will retire. [place]aan: DC lorazepam  Alprazolam 0.5 mg HS Reduced risperidone to 1/2 of 1 mg HS to see if Parkinsonism is better failed but more depressed. Therefore continue risperidone '1mg'$  HS  12/13/21 TC:  getting new bipap machine and on 2 liters O2 at night and appt with pulm Dr. Janee Morn office 01/04/22 and 02/19/22  06/22/22 appt noted: FU with pulm 05/24/22 Dr. Annamaria Boots with further rec changes including O2 at night and get a new Bipap machine. Not using O2 at night bc noise of the machine.  [name] fusses at him for not using.   Psych meds: lithium 450 mg 1 and 1/2 tablets HS and 2 AM, risperidone 1 mg HS, lorazepam 0.5  mg 3 tabs HS Effort to try different sleep meds Xanax and clonazepam failed.  Lorazepam lasts about 3 hours.   6-7 hours of broken sleep and thinks it is the bladder need to urinate. Mood has been ok overall except about sleep. More easily anxious than in the past. Plan: Repeat lithium level.  He took ith this am about 545 AM and will check level about noon today. Then consider reduction.    06/22/22 lab: lihtium level is too high (1.8 but it was not a trough, but about 6 hours after dose).  Tell him to reduce lithium to 1 in the Am and 2 at  night of the 450 mg tablets..  09/21/2022 appointment noted: Some interrupted sleep cycles but [place] didn't make much difference. Mood is pretty steady without mania.  [name] Reichmann is not complaining.   Sees Dr. Annamaria Boots re: OSA with O2 at night.  More compliant with CPAP.  Doesn't bother him as much. Continues meds.  No changes with reduction of lithium to 450 AM and 900 pM unless more focused.   Lost wt from 315 to 260#.  Past Psychiatric Medication Trials: Risperidone 1,  Geodon sedation, haloperidol, Prolixin, Trileptal, gabapentin, sertraline, Wellbutrin,  lithium, amiloride,  lorazepam, zolpidem and Lunesta NR Xanax and clonazepam HS ineffective  He has been under our care since December 1994.  He has had psychiatric hospitalization once.  He has been stable on lithium and risperidone  since about 2007  Review of Systems:  Review of Systems  Constitutional:  Positive for fatigue.  HENT:  Positive for hearing loss and tinnitus.   Cardiovascular:  Negative for chest pain.  Genitourinary:  Positive for frequency.  Musculoskeletal:  Positive for neck pain.  Neurological:  Positive for dizziness and tremors. Negative for weakness.       Balance issues Facial paresthesia  Psychiatric/Behavioral:  Positive for sleep disturbance. Negative for agitation, behavioral problems, confusion, decreased concentration, dysphoric mood, hallucinations, self-injury and suicidal ideas. The patient is not nervous/anxious and is not hyperactive.     Medications: I have reviewed the patient's current medications.  Current Outpatient Medications  Medication Sig Dispense Refill   Dulaglutide (TRULICITY [place]) Inject into the skin.     felodipine (PLENDIL) 5 MG 24 hr tablet 5 mg daily.   4   levothyroxine (SYNTHROID, LEVOTHROID) 88 MCG tablet Take 100 mcg by mouth daily.  5   lisinopril (PRINIVIL,ZESTRIL) 20 MG tablet Take 20 mg by mouth daily.  5   loratadine (CLARITIN) 10 MG tablet Take by mouth.      metFORMIN (GLUCOPHAGE-XR) 500 MG 24 hr tablet Take 1,500 mg by mouth daily.  5   methocarbamol (ROBAXIN) 500 MG tablet take 1 tablet at bed time as needed     mirabegron ER (MYRBETRIQ) 50 MG TB24 tablet Take 1 tablet (50 mg total) by mouth daily. 30 tablet 11   tamsulosin (FLOMAX) 0.4 MG CAPS capsule Take 0.4 mg by mouth.     XIGDUO XR 04-999 MG TB24 Take 1 tablet by mouth every morning.     lithium carbonate (ESKALITH) 450 MG ER tablet 1 TABLETS IN THE MORNING AND 2 TABLETS AT NIGHT 270 tablet 1   risperiDONE (RISPERDAL) 1 MG tablet Take 1 tablet (1 mg total) by mouth at bedtime. 90 tablet 1   Current Facility-Administered Medications  Medication Dose Route Frequency Provider Last Rate Last Admin   acetaminophen (TYLENOL) tablet 500 mg  500 mg Oral Once Ratcliffe, Heather R, PA-C        Medication Side Effects: None  Allergies:  Allergies  Allergen Reactions   Erythromycin Ethylsuccinate Rash   Simvastatin Other (See Comments) and Rash    Past Medical History:  Diagnosis Date   Bipolar 1 disorder ([place])    Depression    Diabetes mellitus without complication ([place])    type 2   Dysrhythmia    Hyperlipidemia    Hypertension    Hypothyroidism    LVH (left ventricular hypertrophy)    Morbid obesity ([facility])    Sleep apnea    on CPAP   Steatohepatitis     History reviewed. No pertinent family history.  Social History   Socioeconomic History   Marital status: Married    Spouse name: Not on file   Number of children: Not on file   Years of education: Not on file   Highest education level: Not on file  Occupational History   Not on file  Tobacco Use   Smoking status: Former    Packs/day: 2.00    Years: 6.00    Total pack years: 12.00    Types: Cigarettes    Start date: 12/11/1973    Quit date: 12/12/1979    Years since quitting: 42.8    Passive exposure: Past   Smokeless tobacco: Never  Vaping Use   Vaping Use: Never used  Substance and Sexual Activity   Alcohol  use: Yes    Alcohol/week: 4.0  standard drinks of alcohol    Types: 4 Cans of beer per week   Drug use: Never   Sexual activity: Not on file  Other Topics Concern   Not on file  Social History Narrative   Not on file   Social Determinants of Health   Financial Resource Strain: Not on file  Food Insecurity: Not on file  Transportation Needs: Not on file  Physical Activity: Not on file  Stress: Not on file  Social Connections: Not on file  Intimate Partner Violence: Not on file    Past Medical History, Surgical history, Social history, and Family history were reviewed and updated as appropriate.   Please see review of systems for further details on the patient's review from today.   Objective:   Physical Exam:  There were no vitals taken for this visit.  Physical Exam Constitutional:      General: He is not in acute distress.    Appearance: He is obese.  Musculoskeletal:        General: No deformity.  Neurological:     Mental Status: He is alert and oriented to person, place, and time.     Motor: Abnormal muscle tone present.     Coordination: Coordination normal.     Gait: Gait abnormal.     Comments: A little smaller step length in gait. Mild-mod increase motor tone No sig tremor noticed  Psychiatric:        Attention and Perception: Attention and perception normal.        Mood and Affect: Mood is anxious. Mood is not depressed. Affect is not labile, blunt, angry or tearful.        Speech: Speech normal. Speech is not rapid and pressured.        Behavior: Behavior normal.        Thought Content: Thought content normal. Thought content is not paranoid or delusional. Thought content does not include homicidal or suicidal ideation. Thought content does not include suicidal plan.        Cognition and Memory: Cognition normal.        Judgment: Judgment normal.     Comments: Insight intact. No auditory or visual hallucinations. No delusions.  Mild drooling and head  forward     Lab Review:     Component Value Date/Time   NA 138 03/31/2022 1140   K 4.5 03/31/2022 1140   CL 102 03/31/2022 1140   CO2 17 (L) 01/06/2020 0724   GLUCOSE 175 (H) 03/31/2022 1140   BUN 16 03/31/2022 1140   CREATININE 1.06 03/31/2022 1140   CALCIUM 9.7 03/31/2022 1140   PROT 7.4 03/31/2022 1140   ALBUMIN 5.0 (H) 03/31/2022 1140   AST 8 03/31/2022 1140   ALKPHOS 57 03/31/2022 1140   BILITOT 0.3 03/31/2022 1140   GFRNONAA 73 01/06/2020 0724   GFRAA 84 01/06/2020 0724    No results found for: "WBC", "RBC", "HGB", "HCT", "PLT", "MCV", "MCH", "MCHC", "RDW", "LYMPHSABS", "MONOABS", "EOSABS", "BASOSABS"  Lithium Lvl  Date Value Ref Range Status  06/22/2022 1.8 (HH) 0.5 - 1.2 mmol/L Final    Comment:    A concentration of 0.5-0.8 mmol/L is advised for long-term use; concentrations of up to 1.2 mmol/L may be necessary during acute treatment. **Verified by repeat analysis**                                  Detection Limit = 0.1                           <  0.1 indicates None Detected Patient drug level exceeds published reference range.  Evaluate clinically for signs of potential toxicity.     10/01/21 Lithium 1.2 on 450 mg tablet 1 and 1/2 at night  No results found for: "PHENYTOIN", "PHENOBARB", "VALPROATE", "CBMZ"   BMP was January 31, 2018 and was within normal limits except for glucose 227, TSH was 4.64, and lithium level was 0.8 which is stable.  .res Assessment: Plan:    Navid was seen today for follow-up.  Diagnoses and all orders for this visit:  Bipolar I disorder ([place]) -     Lithium level -     lithium carbonate (ESKALITH) 450 MG ER tablet; 1 TABLETS IN THE MORNING AND 2 TABLETS AT NIGHT -     risperiDONE (RISPERDAL) 1 MG tablet; Take 1 tablet (1 mg total) by mouth at bedtime.  Obstructive sleep apnea syndrome, severe  Insomnia due to mental condition  Nocturnal hypoxemia  Low serum vitamin B12   Greater than 50% of 30 min face to face time with  patient was spent on counseling and coordination of care. We discussed the following: Long term benefit lithium and risperidone and is stable with past history of psychotic features if not on antipsychotic.  We discussed the short-term risks associated with benzodiazepines including sedation and increased fall risk among others.  Discussed long-term side effect risk including dependence, potential withdrawal symptoms, and the potential eventual dose-related risk of dementia.  But recent studies from 2020 dispute this association between benzodiazepines and dementia risk. Newer studies in 2020 do not support an association with dementia.  Insomnia with broken sleep and failed alternatives.  Likely at part related to poorly treated OSA. continue lorazepam 1.5 mg HS  continue lithium to 450 mg AM and 2 of 450 mg tablets at night.   Repeat lithium level before next app Counseled patient regarding potential benefits, risks, and side effects of lithium to include potential risk of lithium affecting thyroid and renal function.  Discussed need for periodic lab monitoring to determine drug level and to assess for potential adverse effects.  Counseled patient regarding signs and symptoms of lithium toxicity and advised that they notify office immediately or seek urgent medical attention if experiencing these signs and symptoms.  Patient advised to contact office with any questions or concerns.  Disc diagnosing PD vs EPS from risperidone.  Disc risk of lowering the dose.  Reduced risperidone to 1/2 of 1 mg HS to see if Parkinsonism is better failed but more depressed. Therefore continue risperidone '1mg'$  HS  Extensive disc of severe sleep apnea and not using O2 as rec.  Suspect some cognitive problems and other sx related.[name]  keep working on it.  FU 4-6 mos  Lynder Parents, MD, DFAPA    Please see After Visit Summary for patient specific instructions.  Future Appointments  Date Time Provider [place]  10/31/2022  2:30 PM Bjorn Loser, MD [facility] None  03/16/2023 10:00 AM Deneise Lever, MD [facility] None     Orders Placed This Encounter  Procedures   Lithium level       -------------------------------

## 2022-09-27 DIAGNOSIS — F319 Bipolar disorder, unspecified: Secondary | ICD-10-CM | POA: Diagnosis not present

## 2022-09-28 LAB — LITHIUM LEVEL: Lithium Lvl: 1.4 mmol/L (ref 0.5–1.2)

## 2022-10-04 NOTE — Progress Notes (Signed)
LVM to RC 

## 2022-10-05 ENCOUNTER — Encounter: Payer: Self-pay | Admitting: Internal Medicine

## 2022-10-05 NOTE — Assessment & Plan Note (Signed)
Since he does have home oxygen, I suggested he move noisy concentrator out of bedroom.  O2 2 L for sleep through CPAP.

## 2022-10-05 NOTE — Assessment & Plan Note (Signed)
Discussed options.  Do not want to interfere with bladder needs at night. Plan-try Lunesta 3 mg as discussed.

## 2022-10-05 NOTE — Assessment & Plan Note (Signed)
From BiPAP with improved sleep. Plan-continue BiPAP 18/14 pressure support 4

## 2022-10-06 DIAGNOSIS — H903 Sensorineural hearing loss, bilateral: Secondary | ICD-10-CM | POA: Diagnosis not present

## 2022-10-12 ENCOUNTER — Other Ambulatory Visit: Payer: Self-pay

## 2022-10-12 ENCOUNTER — Emergency Department
Admission: EM | Admit: 2022-10-12 | Discharge: 2022-10-12 | Disposition: A | Discharge status: Home / Self Care | Payer: Medicare HMO | Attending: Emergency Medicine | Admitting: Emergency Medicine

## 2022-10-12 DIAGNOSIS — I10 Essential (primary) hypertension: Secondary | ICD-10-CM | POA: Insufficient documentation

## 2022-10-12 DIAGNOSIS — E876 Hypokalemia: Secondary | ICD-10-CM | POA: Insufficient documentation

## 2022-10-12 DIAGNOSIS — E860 Dehydration: Secondary | ICD-10-CM | POA: Diagnosis not present

## 2022-10-12 DIAGNOSIS — E119 Type 2 diabetes mellitus without complications: Secondary | ICD-10-CM | POA: Insufficient documentation

## 2022-10-12 DIAGNOSIS — N179 Acute kidney failure, unspecified: Secondary | ICD-10-CM | POA: Insufficient documentation

## 2022-10-12 DIAGNOSIS — R197 Diarrhea, unspecified: Secondary | ICD-10-CM | POA: Insufficient documentation

## 2022-10-12 DIAGNOSIS — E871 Hypo-osmolality and hyponatremia: Secondary | ICD-10-CM | POA: Insufficient documentation

## 2022-10-12 LAB — COMPREHENSIVE METABOLIC PANEL
ALT: 10 U/L (ref 0–44)
AST: 10 U/L — ABNORMAL LOW (ref 15–41)
Albumin: 3.6 g/dL (ref 3.5–5.0)
Alkaline Phosphatase: 44 U/L (ref 38–126)
Anion gap: 13 (ref 5–15)
BUN: 33 mg/dL — ABNORMAL HIGH (ref 8–23)
CO2: 16 mmol/L — ABNORMAL LOW (ref 22–32)
Calcium: 8.8 mg/dL — ABNORMAL LOW (ref 8.9–10.3)
Chloride: 100 mmol/L (ref 98–111)
Creatinine, Ser: 1.55 mg/dL — ABNORMAL HIGH (ref 0.61–1.24)
GFR, Estimated: 49 mL/min — ABNORMAL LOW (ref >60)
Glucose, Bld: 172 mg/dL — ABNORMAL HIGH (ref 70–99)
Potassium: 3.3 mmol/L — ABNORMAL LOW (ref 3.5–5.1)
Sodium: 129 mmol/L — ABNORMAL LOW (ref 135–145)
Total Bilirubin: 1 mg/dL (ref 0.3–1.2)
Total Protein: 7.1 g/dL (ref 6.5–8.1)

## 2022-10-12 LAB — URINALYSIS, ROUTINE W REFLEX MICROSCOPIC
Bacteria, UA: NONE SEEN
Bilirubin Urine: NEGATIVE
Glucose, UA: >=500 mg/dL — AB
Ketones, ur: NEGATIVE mg/dL
Leukocytes,Ua: NEGATIVE
Nitrite: NEGATIVE
Protein, ur: NEGATIVE mg/dL
Specific Gravity, Urine: 1.005 (ref 1.005–1.030)
pH: 6 (ref 5.0–8.0)

## 2022-10-12 LAB — CBC
HCT: 39.9 % (ref 39.0–52.0)
Hemoglobin: 13.2 g/dL (ref 13.0–17.0)
MCH: 29.7 pg (ref 26.0–34.0)
MCHC: 33.1 g/dL (ref 30.0–36.0)
MCV: 89.7 fL (ref 80.0–100.0)
Platelets: 212 10*3/uL (ref 150–400)
RBC: 4.45 MIL/uL (ref 4.22–5.81)
RDW: 13.1 % (ref 11.5–15.5)
WBC: 7 10*3/uL (ref 4.0–10.5)
nRBC: 0 % (ref 0.0–0.2)

## 2022-10-12 LAB — LIPASE, BLOOD: Lipase: 36 U/L (ref 11–51)

## 2022-10-12 MED ORDER — SODIUM CHLORIDE 0.9 % IV BOLUS
1000.0000 mL | Freq: Once | INTRAVENOUS | Status: AC
  Administered 2022-10-12: 1000 mL via INTRAVENOUS

## 2022-10-12 MED ORDER — POTASSIUM CHLORIDE CRYS ER 20 MEQ PO TBCR
40.0000 meq | EXTENDED_RELEASE_TABLET | Freq: Once | ORAL | Status: AC
  Administered 2022-10-12: 40 meq via ORAL
  Filled 2022-10-12: qty 2

## 2022-10-12 NOTE — Discharge Instructions (Addendum)
Your exam and labs showed some mild dehydration secondary to your diarrhea. Continue to push electrolyte water, and your Imodium as needed.  You should follow-up with primary provider for ongoing symptoms.  Return to the ED if necessary.

## 2022-10-12 NOTE — ED Provider Notes (Signed)
[facility] Emergency Department Provider Note     Event Date/Time   First MD Initiated Contact with Patient 10/12/22 1115     (approximate)   History   Diarrhea   HPI  Carl Melton is a 67 y.o. male with a history of bipolar, HTN, HLD, diabetes, morbid obesity, and steatohepatitis, presents to the ED for evaluation of diarrhea with onset on Sunday. He notes a single episode of emesis, and no current symptoms. He denies FCS or abdominal pain. He denies sick contacts, bad food, or recent travel.     Physical Exam   Triage Vital Signs: ED Triage Vitals  Enc Vitals Group     BP 10/12/22 1010 (!) 115/59     Pulse Rate 10/12/22 1010 91     Resp 10/12/22 1010 16     Temp 10/12/22 1010 98.2 F (36.8 C)     Temp Source 10/12/22 1010 Oral     SpO2 10/12/22 1010 94 %     Weight 10/12/22 1011 250 lb (113.4 kg)     Height 10/12/22 1011 5\' 8"  (1.727 m)     Head Circumference --      Peak Flow --      Pain Score 10/12/22 1010 0     Pain Loc --      Pain Edu? --      Excl. in [place]? --     Most recent vital signs: Vitals:   10/12/22 1010 10/12/22 1424  BP: (!) 115/59 (!) 114/55  Pulse: 91 92  Resp: 16 16  Temp: 98.2 F (36.8 C) 98.2 F (36.8 C)  SpO2: 94% 94%    General Awake, no distress. NAD HEENT NCAT. PERRL. EOMI. No rhinorrhea. Mucous membranes are moist.  CV:  Good peripheral perfusion.  RESP:  Normal effort.  ABD:  No distention. Soft, nontender. Normal bowel sounds. Normal active bowel sounds x 4   ED Results / Procedures / Treatments   Labs (all labs ordered are listed, but only abnormal results are displayed) Labs Reviewed  COMPREHENSIVE METABOLIC PANEL - Abnormal; Notable for the following components:      Result Value   Sodium 129 (*)    Potassium 3.3 (*)    CO2 16 (*)    Glucose, Bld 172 (*)    BUN 33 (*)    Creatinine, Ser 1.55 (*)    Calcium 8.8 (*)    AST 10 (*)    GFR, Estimated 49 (*)    All other components  within normal limits  URINALYSIS, ROUTINE W REFLEX MICROSCOPIC - Abnormal; Notable for the following components:   Color, Urine STRAW (*)    APPearance CLEAR (*)    Glucose, UA >=500 (*)    Hgb urine dipstick SMALL (*)    All other components within normal limits  GASTROINTESTINAL PANEL BY PCR, STOOL (REPLACES STOOL CULTURE)  C DIFFICILE QUICK SCREEN W PCR REFLEX    CBC  LIPASE, BLOOD     EKG   RADIOLOGY  No results found.   PROCEDURES:  Critical Care performed: No  Procedures   MEDICATIONS ORDERED IN ED: Medications  sodium chloride 0.9 % bolus 1,000 mL (0 mLs Intravenous Stopped 10/12/22 1324)  potassium chloride SA (KLOR-CON M) CR tablet 40 mEq (40 mEq Oral Given 10/12/22 1149)     IMPRESSION / MDM / ASSESSMENT AND PLAN / ED COURSE  I reviewed the triage vital signs and the nursing notes.  Differential diagnosis includes, but is not limited to, colitis, viral gastroenteritis  Patient's presentation is most consistent with acute complicated illness / injury requiring diagnostic workup.  Patient's diagnosis is consistent with diarrhea of a presumed infectious etiology.  Patient stable during his course in the ED.  No acute abdominal pain, nausea, or intractable diarrhea.  Patient labs revealed mild dehydration and mild hyponatremia with sodium at 129 and potassium mildly decreased at 3.3.  No acute leukocytosis or critical anemia noted.  AKI due to his excessive fluid loss and dehydration.  Patient given fluid bolus and potassium supplementation in the ED.  He did not have any ongoing diarrhea, therefore no stool sample was submitted.  Patient requesting discharge at this time, noting that his symptoms feel improved and he does not want to induce any diarrhea to provide a sample.  Patient will be discharged home with directions to take OTC Imodium and continue to hydrate with electrolyte water.  Patient is to follow up with his primary provider as  needed or otherwise directed. Patient is given ED precautions to return to the ED for any worsening or new symptoms.   FINAL CLINICAL IMPRESSION(S) / ED DIAGNOSES   Final diagnoses:  Diarrhea in adult patient     Rx / DC Orders   ED Discharge Orders     None        Note:  This document was prepared using Dragon voice recognition software and may include unintentional dictation errors.    Melvenia Needles, PA-C 10/12/22 1459    Carrie Mew, MD 10/12/22 [phone]

## 2022-10-12 NOTE — ED Triage Notes (Signed)
Pt states diarrhea that began on Sunday. Pt states had one episode of emesis when diarrhea began. Pt denies abd pain or known fever.

## 2022-10-13 DIAGNOSIS — H903 Sensorineural hearing loss, bilateral: Secondary | ICD-10-CM | POA: Diagnosis not present

## 2022-10-17 ENCOUNTER — Telehealth: Payer: Self-pay

## 2022-10-17 ENCOUNTER — Encounter: Payer: Self-pay | Admitting: Internal Medicine

## 2022-10-17 DIAGNOSIS — D2272 Melanocytic nevi of left lower limb, including hip: Secondary | ICD-10-CM | POA: Diagnosis not present

## 2022-10-17 DIAGNOSIS — D2262 Melanocytic nevi of left upper limb, including shoulder: Secondary | ICD-10-CM | POA: Diagnosis not present

## 2022-10-17 DIAGNOSIS — L570 Actinic keratosis: Secondary | ICD-10-CM | POA: Diagnosis not present

## 2022-10-17 DIAGNOSIS — L821 Other seborrheic keratosis: Secondary | ICD-10-CM | POA: Diagnosis not present

## 2022-10-17 DIAGNOSIS — D2271 Melanocytic nevi of right lower limb, including hip: Secondary | ICD-10-CM | POA: Diagnosis not present

## 2022-10-17 DIAGNOSIS — D2261 Melanocytic nevi of right upper limb, including shoulder: Secondary | ICD-10-CM | POA: Diagnosis not present

## 2022-10-17 DIAGNOSIS — D225 Melanocytic nevi of trunk: Secondary | ICD-10-CM | POA: Diagnosis not present

## 2022-10-17 NOTE — Telephone Encounter (Signed)
        Patient  visited [facility] on 4/3   Telephone encounter attempt :  1st  A HIPAA compliant voice message was left requesting a return call.  Instructed patient to call bac.    Lenard Forth [facility] Guide, [facility] [phone] [address] [place], [place], [place] [address] Phone: [phone] Email: [email or web address]

## 2022-10-17 NOTE — Telephone Encounter (Signed)
        Patient  visited [facility] on 4/3   Telephone encounter attempt : 2nd    Unable to leave a message    Lenard Forth [facility] Guide, [facility] [phone] [address] [place], [place], [place] [address] Phone: [phone] Email: [email or web address]

## 2022-10-18 NOTE — Telephone Encounter (Signed)
Received the following message from patient's wife Arline Asp:   "I am noticing my husband is constantly clearing his throat while using his Bipap machine and also when he is sitting on the sofa falling asleep. It is keeping me awake and I didn't know if you have any insight on why he is doing this? He has never done this before in all his years on cpap or bipap therapy. If he clears his throat at night, he is very hoarse in the morning. Do we need to send him to an ENT for acid reflux or does he need someone else.    Thank you CindyDimont"  Dr. Maple Hudson, can you please advise? Thanks!

## 2022-10-18 NOTE — Telephone Encounter (Signed)
I think he may be having reflux, since this is new.   Plan- try otc pepcid twice daily before breakfast and before supper. Do this for at least 2 weeks to see if it helps.  If he doesn't get better we can refer him to ENT.

## 2022-10-20 DIAGNOSIS — E7800 Pure hypercholesterolemia, unspecified: Secondary | ICD-10-CM | POA: Diagnosis not present

## 2022-10-20 DIAGNOSIS — E1169 Type 2 diabetes mellitus with other specified complication: Secondary | ICD-10-CM | POA: Diagnosis not present

## 2022-10-31 ENCOUNTER — Encounter: Payer: Self-pay | Admitting: Urology

## 2022-10-31 ENCOUNTER — Ambulatory Visit: Payer: Medicare HMO | Admitting: Urology

## 2022-10-31 VITALS — BP 147/77 | HR 89 | Ht 68.0 in | Wt 250.0 lb

## 2022-10-31 DIAGNOSIS — R32 Unspecified urinary incontinence: Secondary | ICD-10-CM | POA: Diagnosis not present

## 2022-10-31 DIAGNOSIS — N3941 Urge incontinence: Secondary | ICD-10-CM

## 2022-10-31 LAB — URINALYSIS, COMPLETE
Bilirubin, UA: NEGATIVE
Ketones, UA: NEGATIVE
Leukocytes,UA: NEGATIVE
Nitrite, UA: NEGATIVE
Protein,UA: NEGATIVE
RBC, UA: NEGATIVE
Specific Gravity, UA: 1.01 (ref 1.005–1.030)
Urobilinogen, Ur: 0.2 mg/dL (ref 0.2–1.0)
pH, UA: 6.5 (ref 5.0–7.5)

## 2022-10-31 LAB — MICROSCOPIC EXAMINATION

## 2022-10-31 NOTE — Progress Notes (Signed)
10/31/2022 2:33 PM   Carl Melton September 01, 1955 [id number]  Referring provider: Lauro Regulus, MD [address] [facility] [place],  [place] [address]  Chief Complaint  Patient presents with   Cysto    HPI: I was consulted to assess the patient's lower urinary tract symptoms.  He has urge incontinence sometimes associated with discomfort.  He changes underwear or pads on a bad day 4 times a day damp to moderately wet.  This last week he did not have to change his pad system.  He sometimes has mild bedwetting but not every night.  He can void every hour and cannot hold it for 2 hours.  He gets up 4 times at night with no ankle edema   His flow was slow.  He does not feel empty.  He feels an urge and only voids a small amount.   His med list said Flomax but I do not think he is currently taking it   He is on oral hypoglycemics.  He may have had 1 bladder infection in the past    concealed penis with obesity and a negative cough test. Normal genitalia otherwise. Small 40 g benign prostate   Patient has urge incontinence poor flow and nocturia.  He has been on and off Flomax.  I thought I would try him on Myrbetriq and have him come back for a residual and cystoscopy.  Likely he will need urodynamics in the future.  Call if urine culture positive.  I mention urodynamics but did not go through it in detail  Today Frequency stable.  Last culture negative Patient immediately became continent and very happy.  Getting up once or less at night.  No infections Cystoscopy: Patient underwent flexible cystoscopy.  Penile bulbar urethra normal.  He had mild bilobar enlargement of the prostate.  Bladder close and trigone normal.  No cystitis.  Well-tolerated     PMH: Past Medical History:  Diagnosis Date   Bipolar 1 disorder    Depression    Diabetes mellitus without complication    type 2   Dysrhythmia    Hyperlipidemia    Hypertension    Hypothyroidism    LVH  (left ventricular hypertrophy)    Morbid obesity    Sleep apnea    on CPAP   Steatohepatitis     Surgical History: Past Surgical History:  Procedure Laterality Date   APPENDECTOMY     COLONOSCOPY  01/04/2013, 08/04/2009   COLONOSCOPY WITH PROPOFOL N/A 08/06/2018   Procedure: COLONOSCOPY WITH PROPOFOL;  Surgeon: Christena Deem, MD;  Location: [facility] ENDOSCOPY;  Service: Endoscopy;  Laterality: N/A;   COLONOSCOPY WITH PROPOFOL N/A 05/11/2020   Procedure: COLONOSCOPY WITH PROPOFOL;  Surgeon: Regis Bill, MD;  Location: [facility] ENDOSCOPY;  Service: Endoscopy;  Laterality: N/A;   ESOPHAGOGASTRODUODENOSCOPY  03/21/2002   KNEE SURGERY     UVULOPALATOPHARYNGOPLASTY  1997    Home Medications:  Allergies as of 10/31/2022       Reactions   Erythromycin Ethylsuccinate Rash   Simvastatin Other (See Comments), Rash        Medication List        Accurate as of October 31, 2022  2:33 PM. If you have any questions, ask your nurse or doctor.          atorvastatin 80 MG tablet Commonly known as: LIPITOR Take 1 tablet by mouth daily.   Eszopiclone 3 MG Tabs Take 3 mg by mouth at bedtime.  felodipine 5 MG 24 hr tablet Commonly known as: PLENDIL 5 mg daily.   levothyroxine 100 MCG tablet Commonly known as: SYNTHROID Take 100 mcg by mouth daily before breakfast. What changed: Another medication with the same name was removed. Continue taking this medication, and follow the directions you see here. Changed by: Martina Sinner, MD   lisinopril 20 MG tablet Commonly known as: ZESTRIL Take 20 mg by mouth daily.   lithium carbonate 450 MG ER tablet Commonly known as: ESKALITH 1 TABLETS IN THE MORNING AND 2 TABLETS AT NIGHT   loratadine 10 MG tablet Commonly known as: CLARITIN Take by mouth.   metFORMIN 500 MG 24 hr tablet Commonly known as: GLUCOPHAGE-XR Take 1,500 mg by mouth daily.   methocarbamol 500 MG tablet Commonly known as: ROBAXIN take 1 tablet at bed time  as needed   mirabegron ER 50 MG Tb24 tablet Commonly known as: Myrbetriq Take 1 tablet (50 mg total) by mouth daily.   risperiDONE 1 MG tablet Commonly known as: RISPERDAL Take 1 tablet (1 mg total) by mouth at bedtime.   tamsulosin 0.4 MG Caps capsule Commonly known as: FLOMAX Take 0.4 mg by mouth.   TRULICITY [place] Inject into the skin.   Xigduo XR 04-999 MG Tb24 Generic drug: Dapagliflozin Pro-metFORMIN ER Take 1 tablet by mouth every morning.        Allergies:  Allergies  Allergen Reactions   Erythromycin Ethylsuccinate Rash   Simvastatin Other (See Comments) and Rash    Family History: No family history on file.  Social History:  reports that he quit smoking about 42 years ago. His smoking use included cigarettes. He started smoking about 48 years ago. He has a 12.00 pack-year smoking history. He has been exposed to tobacco smoke. He has never used smokeless tobacco. He reports current alcohol use of about 4.0 standard drinks of alcohol per week. He reports that he does not use drugs.  ROS:                                        Physical Exam: BP (!) 147/77   Pulse 89   Ht  (1.727 m)   Wt 113.4 kg   BMI 38.01 kg/m   Constitutional:  Alert and oriented, No acute distress. HEENT: [place] AT, moist mucus membranes.  Trachea midline, no masses.   Laboratory Data: Lab Results  Component Value Date   WBC 7.0 10/12/2022   HGB 13.2 10/12/2022   HCT 39.9 10/12/2022   MCV 89.7 10/12/2022   PLT 212 10/12/2022    Lab Results  Component Value Date   CREATININE 1.55 (H) 10/12/2022    No results found for: "PSA"  No results found for: "TESTOSTERONE"  No results found for: "HGBA1C"  Urinalysis    Component Value Date/Time   COLORURINE STRAW (A) 10/12/2022 1226   APPEARANCEUR CLEAR (A) 10/12/2022 1226   APPEARANCEUR Clear 09/12/2022 1350   LABSPEC 1.005 10/12/2022 1226   PHURINE 6.0 10/12/2022 1226   GLUCOSEU >=500 (A)  10/12/2022 1226   HGBUR SMALL (A) 10/12/2022 1226   BILIRUBINUR NEGATIVE 10/12/2022 1226   BILIRUBINUR Negative 09/12/2022 1350   KETONESUR NEGATIVE 10/12/2022 1226   PROTEINUR NEGATIVE 10/12/2022 1226   NITRITE NEGATIVE 10/12/2022 1226   LEUKOCYTESUR NEGATIVE 10/12/2022 1226    Pertinent Imaging:   Assessment & Plan: Reassess durability with a postvoid residual on Myrbetriq  in 4 months  1. Urinary incontinence, unspecified type  - BLADDER SCAN AMB NON-IMAGING - Urinalysis, Complete   No follow-ups on file.  Martina Sinner, MD  [facility] [address], [address] [place], [place] [address] [phone]

## 2022-11-11 DIAGNOSIS — E039 Hypothyroidism, unspecified: Secondary | ICD-10-CM | POA: Diagnosis not present

## 2022-11-11 DIAGNOSIS — E118 Type 2 diabetes mellitus with unspecified complications: Secondary | ICD-10-CM | POA: Diagnosis not present

## 2022-11-17 DIAGNOSIS — N1831 Chronic kidney disease, stage 3a: Secondary | ICD-10-CM | POA: Diagnosis not present

## 2022-11-17 DIAGNOSIS — Z6841 Body Mass Index (BMI) 40.0 and over, adult: Secondary | ICD-10-CM | POA: Diagnosis not present

## 2022-11-17 DIAGNOSIS — E7800 Pure hypercholesterolemia, unspecified: Secondary | ICD-10-CM | POA: Diagnosis not present

## 2022-11-17 DIAGNOSIS — E1159 Type 2 diabetes mellitus with other circulatory complications: Secondary | ICD-10-CM | POA: Diagnosis not present

## 2022-11-17 DIAGNOSIS — F319 Bipolar disorder, unspecified: Secondary | ICD-10-CM | POA: Diagnosis not present

## 2022-11-17 DIAGNOSIS — E039 Hypothyroidism, unspecified: Secondary | ICD-10-CM | POA: Diagnosis not present

## 2022-11-17 DIAGNOSIS — E1169 Type 2 diabetes mellitus with other specified complication: Secondary | ICD-10-CM | POA: Diagnosis not present

## 2022-11-17 DIAGNOSIS — E785 Hyperlipidemia, unspecified: Secondary | ICD-10-CM | POA: Diagnosis not present

## 2022-11-17 DIAGNOSIS — E1165 Type 2 diabetes mellitus with hyperglycemia: Secondary | ICD-10-CM | POA: Diagnosis not present

## 2022-11-17 DIAGNOSIS — G4733 Obstructive sleep apnea (adult) (pediatric): Secondary | ICD-10-CM | POA: Diagnosis not present

## 2022-11-17 DIAGNOSIS — I152 Hypertension secondary to endocrine disorders: Secondary | ICD-10-CM | POA: Diagnosis not present

## 2022-11-17 DIAGNOSIS — E1122 Type 2 diabetes mellitus with diabetic chronic kidney disease: Secondary | ICD-10-CM | POA: Diagnosis not present

## 2022-11-21 DIAGNOSIS — E7800 Pure hypercholesterolemia, unspecified: Secondary | ICD-10-CM | POA: Diagnosis not present

## 2022-11-21 DIAGNOSIS — N1831 Chronic kidney disease, stage 3a: Secondary | ICD-10-CM | POA: Diagnosis not present

## 2022-11-21 DIAGNOSIS — F319 Bipolar disorder, unspecified: Secondary | ICD-10-CM | POA: Diagnosis not present

## 2022-11-21 DIAGNOSIS — E118 Type 2 diabetes mellitus with unspecified complications: Secondary | ICD-10-CM | POA: Diagnosis not present

## 2022-11-23 ENCOUNTER — Telehealth: Payer: Self-pay | Admitting: Internal Medicine

## 2022-11-23 DIAGNOSIS — G4733 Obstructive sleep apnea (adult) (pediatric): Secondary | ICD-10-CM

## 2022-11-23 NOTE — Telephone Encounter (Signed)
Wife calling because ins changed and [employer] is now supplier of hardware. [employer] needs the following in order to fill this RX:  Face to face notes Sleep Study results Prescription   FAX # is : [phone]  If needed wife's # is [phone]  Mask is ready to fall apart so please hurry,

## 2022-11-24 NOTE — Telephone Encounter (Signed)
Spoke with the pt's spouse to confirm the msg  BIPAP supply order placed  Nothing further needed

## 2022-12-12 ENCOUNTER — Telehealth: Payer: Self-pay | Admitting: Internal Medicine

## 2022-12-12 NOTE — Telephone Encounter (Signed)
Carl Melton wife states patient needs BIPAP machine order sent to [employer], Fax number is [phone]. Carl Melton phone number is [phone].

## 2022-12-13 ENCOUNTER — Telehealth: Payer: Self-pay | Admitting: Internal Medicine

## 2022-12-13 NOTE — Telephone Encounter (Signed)
Patients wife called back and wanted to let us know that she spoke with [employer] and they are going to have to start over. [employer] did not get the documentation needed so they are going to be faxing what they need. Informed patient's wife we will be on the look out. Patient is going out of town soon and they really need to get this straightened out.

## 2022-12-13 NOTE — Telephone Encounter (Signed)
[facility] please advise on pt biapap order being sent to her insurance

## 2022-12-13 NOTE — Telephone Encounter (Signed)
Left message for the wife

## 2022-12-13 NOTE — Telephone Encounter (Signed)
I spoke to pt's wife and told her we do not do any of the insurance filing - this is all done thru dme.  She states she got a letter from [employer] about the bipap machine her husband currently has.  She is changed to [employer] due to now has [facility].  She verified pt had been using [facility] and I told her supply order has been sent to [employer].  I'm not even sure who [facility] is.  She was confused by the whole thing.  I told her she should call [facility] to start with and they should be able to help her with the transition to [employer].  They are aware pt's are having to change due to [facility] and they should be able to help her.  She stated she would.  Nothing further needed.

## 2022-12-14 NOTE — Telephone Encounter (Signed)
I have left a message for Aram Beecham to call me back so I can let her know what I have found out

## 2022-12-14 NOTE — Telephone Encounter (Signed)
I have sent urgent message to [employer] to see if the can find out what is needed

## 2022-12-15 NOTE — Telephone Encounter (Signed)
Do I need to place order for bipap?

## 2022-12-15 NOTE — Telephone Encounter (Signed)
Have you been able to reach patients wife?

## 2022-12-15 NOTE — Telephone Encounter (Signed)
I have spoke with Aram Beecham and tried to explain what she already new. The patient started out with [facility] which uses [facility] to pick DMEs. Patient then switched to [facility] and was using [facility] and had to switch to [employer].  [employer] will need to have new Bipap setup start in order to get the patient approved. I suggested to the wife that until we can get him a new Bipap would they be willing to pay out of pocket for a mask since the patients is in bad shape. I gave her the address to [employer] in [place] to see if they had anything close to the mask that he currently uses.  Christoper Allegra will pick up there machine once the patient gets setup with [employer].

## 2022-12-15 NOTE — Telephone Encounter (Signed)
I would say yes that is my understanding but I could be wrong when dealing with the insurance we sometimes have to make it up as we go along

## 2022-12-18 ENCOUNTER — Other Ambulatory Visit: Payer: Self-pay | Admitting: Psychiatry

## 2022-12-18 DIAGNOSIS — F319 Bipolar disorder, unspecified: Secondary | ICD-10-CM

## 2022-12-18 NOTE — Telephone Encounter (Signed)
I don't see that a PA has been done previously. Verified with pharmacy that needs PA. He has [employer].

## 2022-12-20 DIAGNOSIS — N1831 Chronic kidney disease, stage 3a: Secondary | ICD-10-CM | POA: Diagnosis not present

## 2022-12-20 DIAGNOSIS — E7800 Pure hypercholesterolemia, unspecified: Secondary | ICD-10-CM | POA: Diagnosis not present

## 2022-12-20 DIAGNOSIS — E118 Type 2 diabetes mellitus with unspecified complications: Secondary | ICD-10-CM | POA: Diagnosis not present

## 2022-12-20 DIAGNOSIS — F319 Bipolar disorder, unspecified: Secondary | ICD-10-CM | POA: Diagnosis not present

## 2022-12-21 ENCOUNTER — Other Ambulatory Visit: Payer: Self-pay

## 2022-12-21 DIAGNOSIS — G4733 Obstructive sleep apnea (adult) (pediatric): Secondary | ICD-10-CM

## 2022-12-21 NOTE — Telephone Encounter (Signed)
Prior Approval received for quantity of #270/90 DAY for Lithium ER 450 MG, PA# [id number]

## 2022-12-21 NOTE — Telephone Encounter (Signed)
PA approved through 07/11/2023 

## 2022-12-21 NOTE — Telephone Encounter (Signed)
Order has been placed for Bipap machine

## 2022-12-21 NOTE — Telephone Encounter (Signed)
PA initiated with [facility] for Lithium Carbonate 450 mg ER 3 tablets daily/#270

## 2022-12-22 ENCOUNTER — Telehealth: Payer: Self-pay | Admitting: Psychiatry

## 2022-12-22 MED ORDER — LITHIUM CARBONATE ER 450 MG PO TBCR
EXTENDED_RELEASE_TABLET | ORAL | 1 refills | Status: DC
Start: 2022-12-22 — End: 2023-02-08

## 2022-12-22 NOTE — Telephone Encounter (Signed)
[employer] approved LITHIUM CARBONATE ER 450mg  from 12/21/22-07/11/2023

## 2022-12-23 ENCOUNTER — Other Ambulatory Visit: Payer: Self-pay | Admitting: Psychiatry

## 2022-12-23 DIAGNOSIS — F319 Bipolar disorder, unspecified: Secondary | ICD-10-CM

## 2023-01-03 ENCOUNTER — Encounter: Payer: Self-pay | Admitting: Internal Medicine

## 2023-01-03 DIAGNOSIS — H2513 Age-related nuclear cataract, bilateral: Secondary | ICD-10-CM | POA: Diagnosis not present

## 2023-01-03 DIAGNOSIS — E119 Type 2 diabetes mellitus without complications: Secondary | ICD-10-CM | POA: Diagnosis not present

## 2023-01-08 NOTE — Telephone Encounter (Signed)
I will be happy to see Mr and Mrs Varnadore in a held-spot after I get back.

## 2023-01-11 ENCOUNTER — Telehealth: Payer: Self-pay

## 2023-01-11 NOTE — Telephone Encounter (Signed)
Left pt a voicemail about reducing his Lithium to 900 mg at bedtime, then repeat his "trough" lithium level with BMP in 10 days. If pt does not call back today will try to reach him again Friday, July 5th upon returning back to office.

## 2023-01-23 ENCOUNTER — Telehealth: Payer: Self-pay | Admitting: Psychiatry

## 2023-01-23 ENCOUNTER — Other Ambulatory Visit: Payer: Self-pay

## 2023-01-23 DIAGNOSIS — F319 Bipolar disorder, unspecified: Secondary | ICD-10-CM | POA: Diagnosis not present

## 2023-01-23 NOTE — Telephone Encounter (Signed)
Pt called in. He has adjusted his dose of lithium and needs to get a lab drawn. He said that today would be 10 days since he's been on it and that he was told to get labs done. I don't see a lab order and [facility] doesn't have one on file. He needs to try to get lab today. Please order lab and fax to : Natale Lay  [phone].

## 2023-01-23 NOTE — Telephone Encounter (Signed)
Lab order placed and printed.

## 2023-01-24 DIAGNOSIS — N1831 Chronic kidney disease, stage 3a: Secondary | ICD-10-CM | POA: Diagnosis not present

## 2023-01-24 DIAGNOSIS — E7800 Pure hypercholesterolemia, unspecified: Secondary | ICD-10-CM | POA: Diagnosis not present

## 2023-01-24 DIAGNOSIS — E118 Type 2 diabetes mellitus with unspecified complications: Secondary | ICD-10-CM | POA: Diagnosis not present

## 2023-01-24 DIAGNOSIS — F319 Bipolar disorder, unspecified: Secondary | ICD-10-CM | POA: Diagnosis not present

## 2023-01-24 LAB — LITHIUM LEVEL: Lithium Lvl: 1 mmol/L (ref 0.5–1.2)

## 2023-01-26 NOTE — Progress Notes (Signed)
Lithium level better down to 1.0 from 1.4

## 2023-01-31 DIAGNOSIS — I10 Essential (primary) hypertension: Secondary | ICD-10-CM | POA: Diagnosis not present

## 2023-01-31 DIAGNOSIS — E668 Other obesity: Secondary | ICD-10-CM | POA: Diagnosis not present

## 2023-01-31 DIAGNOSIS — G4733 Obstructive sleep apnea (adult) (pediatric): Secondary | ICD-10-CM | POA: Diagnosis not present

## 2023-01-31 DIAGNOSIS — E785 Hyperlipidemia, unspecified: Secondary | ICD-10-CM | POA: Diagnosis not present

## 2023-01-31 DIAGNOSIS — F319 Bipolar disorder, unspecified: Secondary | ICD-10-CM | POA: Diagnosis not present

## 2023-01-31 DIAGNOSIS — E1122 Type 2 diabetes mellitus with diabetic chronic kidney disease: Secondary | ICD-10-CM | POA: Diagnosis not present

## 2023-02-07 ENCOUNTER — Other Ambulatory Visit: Payer: Self-pay | Admitting: Psychiatry

## 2023-02-07 DIAGNOSIS — F319 Bipolar disorder, unspecified: Secondary | ICD-10-CM

## 2023-02-14 DIAGNOSIS — N1831 Chronic kidney disease, stage 3a: Secondary | ICD-10-CM | POA: Diagnosis not present

## 2023-02-14 DIAGNOSIS — E118 Type 2 diabetes mellitus with unspecified complications: Secondary | ICD-10-CM | POA: Diagnosis not present

## 2023-02-20 DIAGNOSIS — E1169 Type 2 diabetes mellitus with other specified complication: Secondary | ICD-10-CM | POA: Diagnosis not present

## 2023-02-20 DIAGNOSIS — I152 Hypertension secondary to endocrine disorders: Secondary | ICD-10-CM | POA: Diagnosis not present

## 2023-02-20 DIAGNOSIS — E785 Hyperlipidemia, unspecified: Secondary | ICD-10-CM | POA: Diagnosis not present

## 2023-02-20 DIAGNOSIS — E1122 Type 2 diabetes mellitus with diabetic chronic kidney disease: Secondary | ICD-10-CM | POA: Diagnosis not present

## 2023-02-20 DIAGNOSIS — E1165 Type 2 diabetes mellitus with hyperglycemia: Secondary | ICD-10-CM | POA: Diagnosis not present

## 2023-02-20 DIAGNOSIS — E039 Hypothyroidism, unspecified: Secondary | ICD-10-CM | POA: Diagnosis not present

## 2023-02-20 DIAGNOSIS — E1159 Type 2 diabetes mellitus with other circulatory complications: Secondary | ICD-10-CM | POA: Diagnosis not present

## 2023-02-20 DIAGNOSIS — N1831 Chronic kidney disease, stage 3a: Secondary | ICD-10-CM | POA: Diagnosis not present

## 2023-02-21 DIAGNOSIS — N183 Chronic kidney disease, stage 3 unspecified: Secondary | ICD-10-CM | POA: Diagnosis not present

## 2023-02-21 DIAGNOSIS — G4733 Obstructive sleep apnea (adult) (pediatric): Secondary | ICD-10-CM | POA: Diagnosis not present

## 2023-02-21 DIAGNOSIS — E039 Hypothyroidism, unspecified: Secondary | ICD-10-CM | POA: Diagnosis not present

## 2023-02-21 DIAGNOSIS — E7800 Pure hypercholesterolemia, unspecified: Secondary | ICD-10-CM | POA: Diagnosis not present

## 2023-02-21 DIAGNOSIS — Z6841 Body Mass Index (BMI) 40.0 and over, adult: Secondary | ICD-10-CM | POA: Diagnosis not present

## 2023-02-21 DIAGNOSIS — E1122 Type 2 diabetes mellitus with diabetic chronic kidney disease: Secondary | ICD-10-CM | POA: Diagnosis not present

## 2023-02-21 DIAGNOSIS — R202 Paresthesia of skin: Secondary | ICD-10-CM | POA: Diagnosis not present

## 2023-02-27 ENCOUNTER — Encounter: Payer: Self-pay | Admitting: Urology

## 2023-02-27 ENCOUNTER — Ambulatory Visit: Payer: Medicare HMO | Admitting: Urology

## 2023-03-06 ENCOUNTER — Ambulatory Visit: Payer: Medicare HMO | Admitting: Urology

## 2023-03-06 VITALS — BP 132/71 | HR 91 | Ht 68.0 in | Wt 250.0 lb

## 2023-03-06 DIAGNOSIS — R32 Unspecified urinary incontinence: Secondary | ICD-10-CM

## 2023-03-06 LAB — URINALYSIS, COMPLETE
Bilirubin, UA: NEGATIVE
Ketones, UA: NEGATIVE
Leukocytes,UA: NEGATIVE
Nitrite, UA: NEGATIVE
Protein,UA: NEGATIVE
RBC, UA: NEGATIVE
Specific Gravity, UA: 1.01 (ref 1.005–1.030)
Urobilinogen, Ur: 0.2 mg/dL (ref 0.2–1.0)
pH, UA: 6.5 (ref 5.0–7.5)

## 2023-03-06 LAB — MICROSCOPIC EXAMINATION: RBC, Urine: NONE SEEN /hpf (ref 0–2)

## 2023-03-06 LAB — BLADDER SCAN AMB NON-IMAGING: Scan Result: 165

## 2023-03-06 MED ORDER — MIRABEGRON ER 50 MG PO TB24
50.0000 mg | ORAL_TABLET | Freq: Every day | ORAL | 11 refills | Status: DC
Start: 2023-03-06 — End: 2023-12-19

## 2023-03-06 NOTE — Progress Notes (Signed)
03/06/2023 3:31 PM   Carl Melton Samples [birth year] [id number]  Referring provider: Lauro Regulus, MD [address] [facility] [place] [address] [place],  [place] [address]  No chief complaint on file.   HPI: I was consulted to assess the patient's lower urinary tract symptoms.  He has urge incontinence sometimes associated with discomfort.  He changes underwear or pads on a bad day 4 times a day damp to moderately wet.  This last week he did not have to change his pad system.  He sometimes has mild bedwetting but not every night.  He can void every hour and cannot hold it for 2 hours.  He gets up 4 times at night with no ankle edema   His flow was slow.  He does not feel empty.  He feels an urge and only voids a small amount.   His med list said Flomax but I do not think he is currently taking it   He is on oral hypoglycemics.  He may have had 1 bladder infection in the past     concealed penis with obesity and a negative cough test. Normal genitalia otherwise. Small 40 g benign prostate    Patient has urge incontinence poor flow and nocturia.  He has been on and off Flomax.  I thought I would try him on Myrbetriq and have him come back for a residual and cystoscopy.  Likely he will need urodynamics in the future.  Call if urine culture positive.  I mention urodynamics but did not go through it in detail   Last culture negative Patient immediately became continent and very happy.  Getting up once or less at night.  No infections Cystoscopy: Patient underwent flexible cystoscopy.  Penile bulbar urethra normal.  He had mild bilobar enlargement of the prostate.  Bladder close and trigone normal.  No cystitis.  Well-tolerated   Reassess durability with a postvoid residual on Myrbetriq in 4 months   Today Urgency stable.  Postvoid residual today 165 mL Patient was doing great but lost his prescription.  For last 2 weeks he still been dry.    PMH: Past Medical History:  Diagnosis  Date   Bipolar 1 disorder (HCC)    Depression    Diabetes mellitus without complication (HCC)    type 2   Dysrhythmia    Hyperlipidemia    Hypertension    Hypothyroidism    LVH (left ventricular hypertrophy)    Morbid obesity (HCC)    Sleep apnea    on CPAP   Steatohepatitis     Surgical History: Past Surgical History:  Procedure Laterality Date   APPENDECTOMY     COLONOSCOPY  01/04/2013, 08/04/2009   COLONOSCOPY WITH PROPOFOL N/A 08/06/2018   Procedure: COLONOSCOPY WITH PROPOFOL;  Surgeon: Christena Deem, MD;  Location: [facility] ENDOSCOPY;  Service: Endoscopy;  Laterality: N/A;   COLONOSCOPY WITH PROPOFOL N/A 05/11/2020   Procedure: COLONOSCOPY WITH PROPOFOL;  Surgeon: Regis Bill, MD;  Location: [facility] ENDOSCOPY;  Service: Endoscopy;  Laterality: N/A;   ESOPHAGOGASTRODUODENOSCOPY  03/21/2002   KNEE SURGERY     UVULOPALATOPHARYNGOPLASTY  1997    Home Medications:  Allergies as of 03/06/2023       Reactions   Erythromycin Ethylsuccinate Rash   Simvastatin Other (See Comments), Rash        Medication List        Accurate as of March 06, 2023  3:31 PM. If you have any questions, ask your nurse or  doctor.          atorvastatin 80 MG tablet Commonly known as: LIPITOR Take 1 tablet by mouth daily.   Eszopiclone 3 MG Tabs Take 3 mg by mouth at bedtime.   felodipine 5 MG 24 hr tablet Commonly known as: PLENDIL 5 mg daily.   levothyroxine 100 MCG tablet Commonly known as: SYNTHROID Take 100 mcg by mouth daily before breakfast.   lisinopril 20 MG tablet Commonly known as: ZESTRIL Take 20 mg by mouth daily.   lithium carbonate 450 MG ER tablet Commonly known as: ESKALITH Take 2 tablets (900 mg total) by mouth at bedtime.   loratadine 10 MG tablet Commonly known as: CLARITIN Take by mouth.   metFORMIN 500 MG 24 hr tablet Commonly known as: GLUCOPHAGE-XR Take 1,500 mg by mouth daily.   methocarbamol 500 MG tablet Commonly known as: ROBAXIN take  1 tablet at bed time as needed   mirabegron ER 50 MG Tb24 tablet Commonly known as: Myrbetriq Take 1 tablet (50 mg total) by mouth daily.   risperiDONE 1 MG tablet Commonly known as: RISPERDAL TAKE 1 TABLET BY MOUTH AT BEDTIME.   tamsulosin 0.4 MG Caps capsule Commonly known as: FLOMAX Take 0.4 mg by mouth.   TRULICITY [place] Inject into the skin.   Xigduo XR 04-999 MG Tb24 Generic drug: Dapagliflozin Pro-metFORMIN ER Take 1 tablet by mouth every morning.        Allergies:  Allergies  Allergen Reactions   Erythromycin Ethylsuccinate Rash   Simvastatin Other (See Comments) and Rash    Family History: No family history on file.  Social History:  reports that he quit smoking about 43 years ago. His smoking use included cigarettes. He started smoking about 49 years ago. He has a 12 pack-year smoking history. He has been exposed to tobacco smoke. He has never used smokeless tobacco. He reports current alcohol use of about 4.0 standard drinks of alcohol per week. He reports that he does not use drugs.  ROS:                                        Physical Exam: There were no vitals taken for this visit.  Constitutional:  Alert and oriented, No acute distress. HEENT: [place] AT, moist mucus membranes.  Trachea midline, no masses.  Laboratory Data: Lab Results  Component Value Date   WBC 7.0 10/12/2022   HGB 13.2 10/12/2022   HCT 39.9 10/12/2022   MCV 89.7 10/12/2022   PLT 212 10/12/2022    Lab Results  Component Value Date   CREATININE 1.55 (H) 10/12/2022    No results found for: "PSA"  No results found for: "TESTOSTERONE"  No results found for: "HGBA1C"  Urinalysis    Component Value Date/Time   COLORURINE STRAW (A) 10/12/2022 1226   APPEARANCEUR Clear 10/31/2022 1414   LABSPEC 1.005 10/12/2022 1226   PHURINE 6.0 10/12/2022 1226   GLUCOSEU 2+ (A) 10/31/2022 1414   HGBUR SMALL (A) 10/12/2022 1226   BILIRUBINUR Negative 10/31/2022 1414    KETONESUR NEGATIVE 10/12/2022 1226   PROTEINUR Negative 10/31/2022 1414   PROTEINUR NEGATIVE 10/12/2022 1226   NITRITE Negative 10/31/2022 1414   NITRITE NEGATIVE 10/12/2022 1226   LEUKOCYTESUR Negative 10/31/2022 1414   LEUKOCYTESUR NEGATIVE 10/12/2022 1226    Pertinent Imaging:   Assessment & Plan: He was given a prescription of Myrbetriq only to fill if symptoms  return.  Otherwise see him as needed.  If he goes back on it he will see me in a year  1. Urinary incontinence, unspecified type  - Urinalysis, Complete   No follow-ups on file.  Martina Sinner, MD  [facility] [address], [address] [place], [place] [address] [phone]

## 2023-03-14 NOTE — Progress Notes (Unsigned)
HPI M former smoker followed for OSA, Insomnia, complicated by HTN, LVH, Steatohepatitis, Hypercholesterolemia, Hypothyroid, DM2, Bipolar 1, Depression, Morbid Obesity,  PFT 05/24/22- WNL preliminary read ONOX on BIPAP with O2 2L done by [facility] on 07/18/22 showed sustained O2 sat in mid-80's. Doesn't qualify per Medicare guideline because not done during witnessed test on BIPAP. Split Sleep Study ( Sleep Med 05/23/17)- AHI 105.7/ hr, desaturation to 72 %, CPAP to 16, body weight 300 lbs =====================================================================================    09/13/22- 66 yoM former smoker followed for OSA, hx UPPP,  Insomnia, complicated by HTN, LVH, Steatohepatitis, Hypercholesterolemia, Hypothyroid, DM2, Bipolar 1, Depression, Morbid Obesity,  -lorazepam 0.5 mg x 3 hs          -note lithium, Risperdal, Ativan,  BIPAP 18/14, PS 4, O2 2L. Replacement machine ordered  01/26/22/ [facility]     Not wearing oxygen for sleep "too loud".                          Download compliance- 100%, AHI 4.1/ hr ONOX on BIPAP 07/19/22- 2 hr 39 minutes </= 88% ( Doesn't qualify for Rx O2 because done at home, not in-center) Body weight today-266 lbs Covid vax-3 Moderna                                    Wife here Flu vax-had Download reviewed recognizing good compliance and control with BiPAP. He has home oxygen but not wearing it because concentrator is too loud.  I suggested moving it out of the bedroom. We discussed sleep aids.  Sleep is interrupted by bladder spasms being evaluated by urology but he would like something to help consolidate sleep otherwise.  We discussed this.  Previously failed Ambien and lorazepam.  03/16/23- 66 yoM former smoker followed for OSA, hx UPPP,  Insomnia, complicated by HTN, LVH, Steatohepatitis, Hypercholesterolemia, Hypothyroid, DM2, Bipolar 1, Depression, Morbid Obesity,  -lorazepam 0.5 mg x 3 hs          -note lithium, Risperdal, Ativan,  BIPAP 18/14, PS  4, O2 2L. Replacement machine ordered  01/26/22/ [facility]     Not wearing oxygen for sleep "too loud".                          Download compliance-  Body weight today-266 lbs   ROS-see HPI  + = positive Constitutional:    +weight loss, night sweats, fevers, chills, fatigue, lassitude. HEENT:    headaches, difficulty swallowing, tooth/dental problems, sore throat,       sneezing, itching, ear ache, nasal congestion, post nasal drip, snoring CV:    chest pain, orthopnea, PND, swelling in lower extremities, anasarca,                dizziness, palpitations Resp:   +shortness of breath with exertion or at rest.                productive cough,   non-productive cough, coughing up of blood.              change in color of mucus.  wheezing.   Skin:    rash or lesions. GI:  No-   heartburn, indigestion, abdominal pain, nausea, vomiting, diarrhea,                 change in bowel habits, loss of appetite GU: dysuria,  change in color of urine, no urgency or frequency.   flank pain. MS:   joint pain, stiffness, decreased range of motion, back pain. Neuro-     nothing unusual Psych:  change in mood or affect.  depression or anxiety.   memory loss.  OBJ- Physical Exam General- Alert, Oriented, Affect-appropriate, Distress- none acute, +obese Skin- rash-none, lesions- none, excoriation- none Lymphadenopathy- none Head- atraumatic            Eyes- Gross vision intact, PERRLA, conjunctivae and secretions clear            Ears- Hearing, canals-normal            Nose- Clear, no-Septal dev, mucus, polyps, erosion, perforation             Throat- +S/P UPPP, Mallampati II , mucosa clear , drainage- none, tonsils- atrophic Neck- flexible , trachea midline, no stridor , thyroid nl, carotid no bruit Chest - symmetrical excursion , unlabored           Heart/CV- RRR , no murmur , no gallop  , no rub, nl s1 s2                           - JVD- none , edema- none, stasis changes- none, varices- none            Lung- + Shallow, clear to P&A, wheeze- none, cough- none , dullness-none, rub- none           Chest wall-  Abd-  Br/ Gen/ Rectal- Not done, not indicated Extrem- cyanosis- none, clubbing, none, atrophy- none, strength- nl Neuro-

## 2023-03-16 ENCOUNTER — Ambulatory Visit: Payer: Medicare HMO | Admitting: Internal Medicine

## 2023-03-16 ENCOUNTER — Encounter: Payer: Self-pay | Admitting: Internal Medicine

## 2023-03-16 VITALS — BP 122/64 | HR 92 | Ht 68.0 in | Wt 254.2 lb

## 2023-03-16 DIAGNOSIS — G4733 Obstructive sleep apnea (adult) (pediatric): Secondary | ICD-10-CM | POA: Diagnosis not present

## 2023-03-16 DIAGNOSIS — G4734 Idiopathic sleep related nonobstructive alveolar hypoventilation: Secondary | ICD-10-CM

## 2023-03-16 DIAGNOSIS — F5101 Primary insomnia: Secondary | ICD-10-CM | POA: Diagnosis not present

## 2023-03-16 MED ORDER — CLONAZEPAM 0.5 MG PO TABS: ORAL_TABLET | ORAL | 2 refills | Status: DC

## 2023-03-16 NOTE — Assessment & Plan Note (Signed)
Benefits from BiPAP with good compliance and control Plan-continue/14

## 2023-03-16 NOTE — Assessment & Plan Note (Signed)
We discussed trial of a longer acting sleep medication with emphasis on safety issues and avoiding morning carryover. Plan-try clonazepam 0.5 mg 1 or 2 at bedtime.

## 2023-03-16 NOTE — Assessment & Plan Note (Signed)
Using nighttime oxygen is finding it too disruptive.  Says he sleeps fine without it.

## 2023-03-16 NOTE — Patient Instructions (Signed)
Instead of lunesta, try clonazepam 1 or 2 tabs for sleep as needed  You are doing well with your BIPAP- we can continue 18/14

## 2023-03-27 ENCOUNTER — Encounter: Payer: Self-pay | Admitting: Psychiatry

## 2023-03-27 ENCOUNTER — Ambulatory Visit (INDEPENDENT_AMBULATORY_CARE_PROVIDER_SITE_OTHER): Payer: Medicare HMO | Admitting: Psychiatry

## 2023-03-27 DIAGNOSIS — G4733 Obstructive sleep apnea (adult) (pediatric): Secondary | ICD-10-CM

## 2023-03-27 DIAGNOSIS — E538 Deficiency of other specified B group vitamins: Secondary | ICD-10-CM | POA: Diagnosis not present

## 2023-03-27 DIAGNOSIS — F319 Bipolar disorder, unspecified: Secondary | ICD-10-CM

## 2023-03-27 DIAGNOSIS — G4734 Idiopathic sleep related nonobstructive alveolar hypoventilation: Secondary | ICD-10-CM

## 2023-03-27 DIAGNOSIS — F5105 Insomnia due to other mental disorder: Secondary | ICD-10-CM

## 2023-03-27 MED ORDER — SCOPOLAMINE 1 MG/3DAYS TD PT72: 1.0000 | MEDICATED_PATCH | TRANSDERMAL | 0 refills | Status: DC

## 2023-03-27 NOTE — Progress Notes (Signed)
Carl Melton [id number] 02/23/1956 67 y.o.  Subjective:   Patient ID:  Carl Melton is a 67 y.o. (DOB 08/22/1955) male.  Chief Complaint:  Chief Complaint  Patient presents with   Follow-up    Mood, anxiety , sleep, meds    HPI Carl Melton presents to the office today for follow-up of bipolar disorder and sleep.    seen December 2020.  No meds were changed.  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    01/06/2020 appointment with the following noted: Continues lithium 450 mg 3 and 1/2 tablets daily, lorazepam 1.5 mg HS, Risperdal 1 mg HS. Vaccinated. OK overall.  Doing job well with good reports but getting harder to manage multiple pieces.  Want to work 2 more years after this one.  Has good assistants.  Has good rapport with coworkers.  Mood been OK.  Only thing he notices is tremor with fine motor things at times.  OK typing.  Done well with work.  Has been able to keep going forward.  Would like to retire sooner than 66 but not sure.  Would stay busy with retirement.   Sleep is pretty good and naps after work.  OK during the day.  Has to get up anyway bc nocturia and now having trouble going back too sleep for the last 3-4 mos. 2 nights out of the week. Most of the time feeling good.  No significant mood swings No unusual fear.  Sleep is better witn increase in lorazepam without awakening.  Active.  Patient reports stable mood and denies depressed.  Patient denies difficulty with sleep initiation. Denies appetite disturbance.  Patient reports that energy and motivation have been good.  Patient denies any difficulty with concentration.  Patient denies any suicidal ideation.  Physical health stable. Plan; check lithium level. No med changes  07/07/2020 appointment with the following noted:  Still undecided about retirement but probably a couple of years more.  Job is going OK. Occ a little agitated but not manic and no  problems with it.  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  More of it in the last year, bc of a couple of key people leaving but it's better now. No mood swings.   Not sleeping great.  DM is a problem causing nocturia 4-5 times nightly. No sig SE lithium. Patient reports stable mood and denies depressed or irritable moods.  Patient denies any recent difficulty with anxiety. Denies appetite disturbance.  Patient reports that energy and motivation have been good.  Patient denies any difficulty with concentration.  Patient denies any suicidal ideation. Plan: for nocutria switch lithium to 900 mg AM and  1 and 1/2 of 450 mg tablets at night.  12/30/2020 appointment with the following noted: Fine with meds but some trouble sleeping but thinks it's stress.  [place] and [place] up once to urinate.  Home up 2-3 times nightly. Wonders if he has beginning PD with tremor and slower and stiffer. Worries too much.  Patient reports stable mood and denies depressed or irritable moods.  Patient denies any recent difficulty with anxiety.  Denies appetite disturbance.  Patient reports that energy and motivation have been good.  Patient denies any difficulty with concentration.  Patient denies any suicidal ideation. Plan:  No change indicated except for nocutria switch lithium to 900 mg AM and  1 and 1/2 of 450 mg tablets at  night. Disc diagnosing PD vs EPS from risperidone.  Disc risk of lowering the dose.  Reduce risperidone to 1/2 of 1 mg HS to see if Parkinsonism is better  03/29/21 appt noted: For 2 weeks on lower risperidone OK and then depressed.  Increased to 1 mg HS and in 2 days he felt better. Tremor, slowness did not improve when reduced risperidone. Saw PCP thinks it's not PD Postional vertigo with head movment.  Facial paresthesias. Nocturia unchanged.  Asks about meds for it. No mood swings.   Patient reports stable mood and denies depressed or irritable moods.  Patient  denies any recent difficulty with anxiety.  Patient denies difficulty with sleep initiation or maintenance. Denies appetite disturbance.  Patient reports that energy and motivation have been good.  Patient denies any difficulty with concentration.  Patient denies any suicidal ideation. Tremor unchanged. Plan: Check lithium level Check B12 for paresthesias. Continue lithium 900 mg every morning and 1-1/2 of the 450 mg tablets at night Continue risperidone 1 mg nightly  08/31/21 appt .   Pain in neck interfering.  On 3rd doc now.  Pending MRI.  Aggrivatiing. PT didn't help nor chiropracter.  Seeing ortho now. Sleeping problem.  To bed 830 and awake 0200 and back to bed after 30 min. Mood is ok.  No fear nor anxiety. Some daytime drowsiness.  Checking into new CPAP 12/17/21 will retire. Plaan: DC lorazepam  Alprazolam 0.5 mg HS Reduced risperidone to 1/2 of 1 mg HS to see if Parkinsonism is better failed but more depressed. Therefore continue risperidone 1mg  HS  12/13/21 TC:  getting new bipap machine and on 2 liters O2 at night and appt with pulm Dr. Roxy Melton office 01/04/22 and 02/19/22  06/22/22 appt noted: FU with pulm 05/24/22 Carl Melton with further rec changes including O2 at night and get a new Bipap machine. Not using O2 at night bc noise of the machine.  Carl Melton fusses at him for not using.   Psych meds: lithium 450 mg 1 and 1/2 tablets HS and 2 AM, risperidone 1 mg HS, lorazepam 0.5  mg 3 tabs HS Effort to try different sleep meds Xanax and clonazepam failed.  Lorazepam lasts about 3 hours.   6-7 hours of broken sleep and thinks it is the bladder need to urinate. Mood has been ok overall except about sleep. More easily anxious than in the past. Plan: Repeat lithium level.  He took ith this am about 545 AM and will check level about noon today. Then consider reduction.    06/22/22 lab: lihtium level is too high (1.8 but it was not a trough, but about 6 hours after dose).  Tell him to  reduce lithium to 1 in the Am and 2 at night of the 450 mg tablets..  09/21/2022 appointment noted: Some interrupted sleep cycles but Carl Melton didn't make much difference. Mood is pretty steady without mania.  Arline Asp is not complaining.   Sees Carl Melton re: OSA with O2 at night.  More compliant with CPAP.  Doesn't bother him as much. Continues meds.  No changes with reduction of lithium to 450 AM and 900 pM unless more focused.   Lost wt from 315 to 260#. Plan: continue lithium to 450 mg AM and 2 of 450 mg tablets at night.   Repeat lithium level before next app   03/27/23 appt noted: Dr. Cheryll Cockayne Rx clonazepam 0.5 mg 1-2 mg HS for sleep. Psych med: lithium ER 450 BID, risperidone 1  mg HS. Mood has been good. Still some trouble with sleep. Helps with mother 41 yo.  Busier than he wants to be.   No problems with meds.    No SE with less lithium.   Off O2 at night bc it was irritating and noisy.  Saw Carl Melton last week.  Who said ok to stop it.   Using BIPAP.  Past Psychiatric Medication Trials: Risperidone 1,  Geodon sedation, haloperidol, Prolixin, Trileptal, gabapentin, sertraline, Wellbutrin,  lithium, amiloride,  lorazepam, zolpidem and Lunesta NR Xanax and clonazepam HS ineffective  He has been under our care since December 1994.  He has had psychiatric hospitalization once.  He has been stable on lithium and risperidone since about 2007  Review of Systems:  Review of Systems  Constitutional:  Positive for fatigue.  HENT:  Positive for hearing loss and tinnitus.   Genitourinary:  Positive for frequency.  Musculoskeletal:  Positive for neck pain.  Neurological:  Negative for dizziness, tremors and weakness.       Balance issues Facial paresthesia  Psychiatric/Behavioral:  Positive for sleep disturbance. Negative for agitation, behavioral problems, confusion, decreased concentration, dysphoric mood, hallucinations, self-injury and suicidal ideas. The patient is not  nervous/anxious and is not hyperactive.     Medications: I have reviewed the patient's current medications.  Current Outpatient Medications  Medication Sig Dispense Refill   atorvastatin (LIPITOR) 80 MG tablet Take 1 tablet by mouth daily.     clonazePAM (KLONOPIN) 0.5 MG tablet 1 or 2 tabs for sleep as needed 30 tablet 2   Dulaglutide (TRULICITY Harper) Inject into the skin.     felodipine (PLENDIL) 5 MG 24 hr tablet 5 mg daily.   4   JARDIANCE 25 MG TABS tablet Take 25 mg by mouth daily.     levothyroxine (SYNTHROID) 100 MCG tablet Take 100 mcg by mouth daily before breakfast.     lisinopril (PRINIVIL,ZESTRIL) 20 MG tablet Take 20 mg by mouth daily.  5   lithium carbonate (ESKALITH) 450 MG ER tablet Take 2 tablets (900 mg total) by mouth at bedtime. 60 tablet 0   loratadine (CLARITIN) 10 MG tablet Take by mouth.     metFORMIN (GLUCOPHAGE-XR) 500 MG 24 hr tablet Take 1,500 mg by mouth daily.  5   methocarbamol (ROBAXIN) 500 MG tablet take 1 tablet at bed time as needed     mirabegron ER (MYRBETRIQ) 50 MG TB24 tablet Take 1 tablet (50 mg total) by mouth daily. 30 tablet 11   risperiDONE (RISPERDAL) 1 MG tablet TAKE 1 TABLET BY MOUTH AT BEDTIME. 90 tablet 1   scopolamine (TRANSDERM-SCOP) 1 MG/3DAYS Place 1 patch (1.5 mg total) onto the skin every 3 (three) days. 3 patch 0   tamsulosin (FLOMAX) 0.4 MG CAPS capsule Take 0.4 mg by mouth.     XIGDUO XR 04-999 MG TB24 Take 1 tablet by mouth every morning.     Current Facility-Administered Medications  Medication Dose Route Frequency Provider Last Rate Last Admin   acetaminophen (TYLENOL) tablet 500 mg  500 mg Oral Once Ratcliffe, Heather R, PA-C        Medication Side Effects: None  Allergies:  Allergies  Allergen Reactions   Erythromycin Ethylsuccinate Rash   Simvastatin Other (See Comments) and Rash    Past Medical History:  Diagnosis Date   Bipolar 1 disorder (HCC)    Depression    Diabetes mellitus without complication (HCC)     type 2   Dysrhythmia  Hyperlipidemia    Hypertension    Hypothyroidism    LVH (left ventricular hypertrophy)    Morbid obesity (HCC)    Sleep apnea    on CPAP   Steatohepatitis     History reviewed. No pertinent family history.  Social History   Socioeconomic History   Marital status: Married    Spouse name: Not on file   Number of children: Not on file   Years of education: Not on file   Highest education level: Not on file  Occupational History   Not on file  Tobacco Use   Smoking status: Former    Current packs/day: 0.00    Average packs/day: 2.0 packs/day for 6.0 years (12.0 ttl pk-yrs)    Types: Cigarettes    Start date: 12/11/1973    Quit date: 12/12/1979    Years since quitting: 43.3    Passive exposure: Past   Smokeless tobacco: Never  Vaping Use   Vaping status: Never Used  Substance and Sexual Activity   Alcohol use: Yes    Alcohol/week: 4.0 standard drinks of alcohol    Types: 4 Cans of beer per week   Drug use: Never   Sexual activity: Not on file  Other Topics Concern   Not on file  Social History Narrative   Not on file   Social Determinants of Health   Financial Resource Strain: Not on file  Food Insecurity: Not on file  Transportation Needs: Not on file  Physical Activity: Not on file  Stress: Not on file  Social Connections: Not on file  Intimate Partner Violence: Not on file    Past Medical History, Surgical history, Social history, and Family history were reviewed and updated as appropriate.   Please see review of systems for further details on the patient's review from today.   Objective:   Physical Exam:  There were no vitals taken for this visit.  Physical Exam Constitutional:      General: He is not in acute distress.    Appearance: He is obese.  Musculoskeletal:        General: No deformity.  Neurological:     Mental Status: He is alert and oriented to person, place, and time.     Motor: Abnormal muscle tone present.      Coordination: Coordination normal.     Gait: Gait abnormal.     Comments: A little smaller step length in gait. Mild-mod increase motor tone No sig tremor noticed  Psychiatric:        Attention and Perception: Attention and perception normal.        Mood and Affect: Mood is anxious. Mood is not depressed. Affect is not labile, blunt, angry or tearful.        Speech: Speech normal. Speech is not rapid and pressured.        Behavior: Behavior normal.        Thought Content: Thought content normal. Thought content is not paranoid or delusional. Thought content does not include homicidal or suicidal ideation. Thought content does not include suicidal plan.        Cognition and Memory: Cognition normal.        Judgment: Judgment normal.     Comments: Insight intact. No auditory or visual hallucinations. No delusions.  no drooling and alert    Lab Review:     Component Value Date/Time   NA 129 (L) 10/12/2022 1012   NA 138 03/31/2022 1140   K 3.3 (L) 10/12/2022 1012  CL 100 10/12/2022 1012   CO2 16 (L) 10/12/2022 1012   GLUCOSE 172 (H) 10/12/2022 1012   BUN 33 (H) 10/12/2022 1012   BUN 16 03/31/2022 1140   CREATININE 1.55 (H) 10/12/2022 1012   CALCIUM 8.8 (L) 10/12/2022 1012   PROT 7.1 10/12/2022 1012   PROT 7.4 03/31/2022 1140   ALBUMIN 3.6 10/12/2022 1012   ALBUMIN 5.0 (H) 03/31/2022 1140   AST 10 (L) 10/12/2022 1012   ALT 10 10/12/2022 1012   ALKPHOS 44 10/12/2022 1012   BILITOT 1.0 10/12/2022 1012   BILITOT 0.3 03/31/2022 1140   GFRNONAA 49 (L) 10/12/2022 1012   GFRAA 84 01/06/2020 0724       Component Value Date/Time   WBC 7.0 10/12/2022 1012   RBC 4.45 10/12/2022 1012   HGB 13.2 10/12/2022 1012   HCT 39.9 10/12/2022 1012   PLT 212 10/12/2022 1012   MCV 89.7 10/12/2022 1012   MCH 29.7 10/12/2022 1012   MCHC 33.1 10/12/2022 1012   RDW 13.1 10/12/2022 1012    Lithium Lvl  Date Value Ref Range Status  01/23/2023 1.0 0.5 - 1.2 mmol/L Final    Comment:    A  concentration of 0.5-0.8 mmol/L is advised for long-term use; concentrations of up to 1.2 mmol/L may be necessary during acute treatment.                                  Detection Limit = 0.1                           <0.1 indicates None Detected              Component Ref Range & Units 2 mo ago (01/23/23) 6 mo ago (09/27/22) 9 mo ago (06/22/22) 1 yr ago (10/01/21) 1 yr ago (03/30/21) 3 yr ago (01/06/20) 3 yr ago (07/18/19)  Lithium Lvl 0.5 - 1.2 mmol/L 1.0 1.4 High Panic  CM 1.8 High Panic  CM 1.2 CM 1.4 High Panic  CM 1.1 CM 1.2 R, CM    01/23/23 Lithium level 1.0 on 900 mg daily.  10/01/21 Lithium 1.2 on 450 mg tablet 1 and 1/2 at night  No results found for: "PHENYTOIN", "PHENOBARB", "VALPROATE", "CBMZ"   BMP was January 31, 2018 and was within normal limits except for glucose 227, TSH was 4.64, and lithium level was 0.8 which is stable.  .res Assessment: Plan:    Bohannon was seen today for follow-up.  Diagnoses and all orders for this visit:  Bipolar I disorder (HCC) -     Lithium level  Obstructive sleep apnea syndrome, severe  Insomnia due to mental condition  Nocturnal hypoxemia  Low serum vitamin B12  Other orders -     scopolamine (TRANSDERM-SCOP) 1 MG/3DAYS; Place 1 patch (1.5 mg total) onto the skin every 3 (three) days.    30 min face to face time with patient was spent on counseling and coordination of care. We discussed the following: Long term benefit lithium and risperidone and is stable with past history of psychotic features if not on antipsychotic.  We discussed the short-term risks associated with benzodiazepines including sedation and increased fall risk among others.  Discussed long-term side effect risk including dependence, potential withdrawal symptoms, and the potential eventual dose-related risk of dementia.  But recent studies from 2020 dispute this association between benzodiazepines and dementia risk. Newer  studies in 2020 do not support an  association with dementia.  Insomnia with broken sleep and failed alternatives.  Likely at part related to poorly treated OSA. continue lorazepam 1.5 mg HS  continue lithium to 450 mg BID with level 1.0 better.   Repeat lithium level before next app Counseled patient regarding potential benefits, risks, and side effects of lithium to include potential risk of lithium affecting thyroid and renal function.  Discussed need for periodic lab monitoring to determine drug level and to assess for potential adverse effects.  Counseled patient regarding signs and symptoms of lithium toxicity and advised that they notify office immediately or seek urgent medical attention if experiencing these signs and symptoms.  Patient advised to contact office with any questions or concerns.  Less movement problems.  Reduced risperidone to 1/2 of 1 mg HS to see if Parkinsonism is better failed but more depressed.   Therefore continue risperidone 1mg  HS  Extensive disc of severe sleep apnea importance of treating it for brain health.  FU 4-6 mos  Meredith Staggers, MD, DFAPA    Please see After Visit Summary for patient specific instructions.  Future Appointments  Date Time Provider Department Center  07/17/2023  9:30 AM Waymon Budge, MD [facility] None     Orders Placed This Encounter  Procedures   Lithium level       -------------------------------

## 2023-04-18 DIAGNOSIS — G4733 Obstructive sleep apnea (adult) (pediatric): Secondary | ICD-10-CM | POA: Diagnosis not present

## 2023-04-18 DIAGNOSIS — E1122 Type 2 diabetes mellitus with diabetic chronic kidney disease: Secondary | ICD-10-CM | POA: Diagnosis not present

## 2023-04-18 DIAGNOSIS — E785 Hyperlipidemia, unspecified: Secondary | ICD-10-CM | POA: Diagnosis not present

## 2023-04-18 DIAGNOSIS — F319 Bipolar disorder, unspecified: Secondary | ICD-10-CM | POA: Diagnosis not present

## 2023-04-18 DIAGNOSIS — E669 Obesity, unspecified: Secondary | ICD-10-CM | POA: Diagnosis not present

## 2023-04-18 DIAGNOSIS — I10 Essential (primary) hypertension: Secondary | ICD-10-CM | POA: Diagnosis not present

## 2023-04-20 ENCOUNTER — Other Ambulatory Visit: Payer: Self-pay | Admitting: Nephrology

## 2023-04-20 DIAGNOSIS — E1122 Type 2 diabetes mellitus with diabetic chronic kidney disease: Secondary | ICD-10-CM

## 2023-05-02 ENCOUNTER — Ambulatory Visit
Admission: RE | Admit: 2023-05-02 | Discharge: 2023-05-02 | Disposition: A | Discharge status: Home / Self Care | Payer: Medicare HMO | Source: Ambulatory Visit | Attending: Nephrology | Admitting: Nephrology

## 2023-05-02 DIAGNOSIS — N281 Cyst of kidney, acquired: Secondary | ICD-10-CM | POA: Diagnosis not present

## 2023-05-02 DIAGNOSIS — E1122 Type 2 diabetes mellitus with diabetic chronic kidney disease: Secondary | ICD-10-CM | POA: Insufficient documentation

## 2023-05-02 DIAGNOSIS — N189 Chronic kidney disease, unspecified: Secondary | ICD-10-CM | POA: Diagnosis not present

## 2023-06-22 DIAGNOSIS — E1122 Type 2 diabetes mellitus with diabetic chronic kidney disease: Secondary | ICD-10-CM | POA: Diagnosis not present

## 2023-06-22 DIAGNOSIS — N183 Chronic kidney disease, stage 3 unspecified: Secondary | ICD-10-CM | POA: Diagnosis not present

## 2023-06-22 DIAGNOSIS — I10 Essential (primary) hypertension: Secondary | ICD-10-CM | POA: Diagnosis not present

## 2023-06-22 DIAGNOSIS — E7800 Pure hypercholesterolemia, unspecified: Secondary | ICD-10-CM | POA: Diagnosis not present

## 2023-06-28 ENCOUNTER — Other Ambulatory Visit: Payer: Self-pay

## 2023-07-12 ENCOUNTER — Other Ambulatory Visit: Payer: Self-pay | Admitting: Psychiatry

## 2023-07-12 DIAGNOSIS — F319 Bipolar disorder, unspecified: Secondary | ICD-10-CM

## 2023-07-15 NOTE — Progress Notes (Deleted)
 Download reviewed.   HPI M former smoker followed for OSA, Insomnia, complicated by HTN, LVH, Steatohepatitis, Hypercholesterolemia, Hypothyroid, DM2, Bipolar 1, Depression, Morbid Obesity,  PFT 05/24/22- WNL preliminary read ONOX on BIPAP with O2 2L done by [facility] on 07/18/22 showed sustained O2 sat in mid-80's. Doesn't qualify per Medicare guideline because not done during witnessed test on BIPAP. Split Sleep Study ([facility] 05/23/17)- AHI 105.7/ hr, desaturation to 72 %, CPAP to 16, body weight 300 lbs =====================================================================================    03/16/23- 66 yoM former smoker followed for OSA, hx UPPP,  Insomnia, complicated by HTN, LVH, Steatohepatitis, Hypercholesterolemia, Hypothyroid, DM2, Bipolar 1, Depression, Morbid Obesity,   failed Lunesta         -note lithium , Risperdal , Ativan ,  BIPAP 18/14, PS 4, (O2 2L). Replacement machine ordered  01/26/22/ [facility]     Not wearing oxygen  for sleep too loud.                          Download compliance- 100%, AHI 4/hr Body weight today-254 lbs Download reviewed.  He stopped wearing supplemental oxygen  because it was too noisy and disruptive. Bedtime around 9:00.  Wakes routinely around 2 AM and cannot get back to sleep until about 6 AM.  Needs a lorazepam  or Lunesta  seem to help this.  We discussed potential role of sleep medicine, emphasize safety and discussed extent to which urinary urgency is waking him.  We agreed to try clonazepam .  07/16/22- 67 yoM former smoker followed for OSA, hx UPPP,  Insomnia, complicated by HTN, LVH, Steatohepatitis, Hypercholesterolemia, Hypothyroid, DM2, Bipolar 1, Depression, Morbid Obesity,   failed Lunesta         -note lithium , Risperdal , Ativan ,  BIPAP 18/14, PS 4, (O2 2L). Replacement machine ordered  01/26/22/ [facility]     Not wearing oxygen  for sleep too loud.                          Download compliance- 100%, AHI 4/hr Body weight today-  ROS-see HPI   + = positive Constitutional:    +weight loss, night sweats, fevers, chills, fatigue, lassitude. HEENT:    headaches, difficulty swallowing, tooth/dental problems, sore throat,       sneezing, itching, ear ache, nasal congestion, post nasal drip, snoring CV:    chest pain, orthopnea, PND, swelling in lower extremities, anasarca,                dizziness, palpitations Resp:   +shortness of breath with exertion or at rest.                productive cough,   non-productive cough, coughing up of blood.              change in color of mucus.  wheezing.   Skin:    rash or lesions. GI:  No-   heartburn, indigestion, abdominal pain, nausea, vomiting, diarrhea,                 change in bowel habits, loss of appetite GU: dysuria, change in color of urine, no urgency or frequency.   flank pain. MS:   joint pain, stiffness, decreased range of motion, back pain. Neuro-     nothing unusual Psych:  change in mood or affect.  depression or anxiety.   memory loss.  OBJ- Physical Exam General- Alert, Oriented, Affect-appropriate, Distress- none acute, +obese Skin- rash-none, lesions- none, excoriation- none Lymphadenopathy- none Head- atraumatic  Eyes- Gross vision intact, PERRLA, conjunctivae and secretions clear            Ears- Hearing, canals-normal            Nose- Clear, no-Septal dev, mucus, polyps, erosion, perforation             Throat- +S/P UPPP, Mallampati II , mucosa clear , drainage- none, tonsils- atrophic Neck- flexible , trachea midline, no stridor , thyroid  nl, carotid no bruit Chest - symmetrical excursion , unlabored           Heart/CV- RRR , no murmur , no gallop  , no rub, nl s1 s2                           - JVD- none , edema- none, stasis changes- none, varices- none           Lung- + Shallow, clear to P&A, wheeze- none, cough- none , dullness-none, rub- none           Chest wall-  Abd-  Br/ Gen/ Rectal- Not done, not indicated Extrem- cyanosis- none, clubbing, none,  atrophy- none, strength- nl Neuro-

## 2023-07-17 ENCOUNTER — Ambulatory Visit: Payer: Medicare HMO | Admitting: Internal Medicine

## 2023-07-19 NOTE — Progress Notes (Signed)
 Download reviewed.   HPI M former smoker followed for OSA, Insomnia, complicated by HTN, LVH, Steatohepatitis, Hypercholesterolemia, Hypothyroid, DM2, Bipolar 1, Depression, Morbid Obesity,  PFT 05/24/22- WNL preliminary read ONOX on BIPAP with O2 2L done by [facility] on 07/18/22 showed sustained O2 sat in mid-80's. Doesn't qualify per Medicare guideline because not done during witnessed test on BIPAP. Split Sleep Study ([facility] 05/23/17)- AHI 105.7/ hr, desaturation to 72 %, CPAP to 16, body weight 300 lbs =====================================================================================    03/16/23- 66 yoM former smoker followed for OSA, hx UPPP,  Insomnia, complicated by HTN, LVH, Steatohepatitis, Hypercholesterolemia, Hypothyroid, DM2, Bipolar 1, Depression, Morbid Obesity,   failed Lunesta         -note lithium , Risperdal , Ativan ,  BIPAP 18/14, PS 4, (O2 2L). Replacement machine ordered  01/26/22/ [facility]     Not wearing oxygen  for sleep too loud.                          Download compliance- 100%, AHI 4/hr Body weight today-254 lbs Download reviewed.  He stopped wearing supplemental oxygen  because it was too noisy and disruptive. Bedtime around 9:00.  Wakes routinely around 2 AM and cannot get back to sleep until about 6 AM.  Needs a lorazepam  or Lunesta  seem to help this.  We discussed potential role of sleep medicine, emphasize safety and discussed extent to which urinary urgency is waking him.  We agreed to try clonazepam .  07/20/23- 67 yoM former smoker followed for OSA, hx UPPP,  Insomnia, complicated by HTN, LVH, Steatohepatitis, Hypercholesterolemia, Hypothyroid, DM2, Bipolar 1, Depression, Morbid Obesity,   failed Lunesta         -note lithium , Risperdal , Ativan ,  BIPAP 18/14, PS 4, (O2 2L). Replacement machine ordered  01/26/22/ [facility]    AirCurve 10 VAuto Not wearing oxygen  for sleep too loud.                          Download compliance- 100%, AHI 2.7/hr Body weight  today- Failed clonazepam  0.5 1-2 for sleep, satisfied not to take anything. Bedtime 9:00, morning up around 8:00 AM. He is retired and comfortable with this.  Discussed the use of AI scribe software for clinical note transcription with the patient, who gave verbal consent to proceed.  History of Present Illness   The patient, with a history of sleep apnea managed with a BiPAP machine, presents with sleep disturbance and a nocturnal cough. He reports sleeping for three to four hours at a time before waking up, often due to the need to urinate. He then spends two to three hours awake on the couch watching TV before he can fall asleep again. During these periods of wakefulness at night, he notices a cough that produces a small amount of phlegm. Interestingly, this cough only occurs when he is sitting up at night and does not occur during the day or when he is lying down.  The patient was previously prescribed clonazepam  as a sleep aid, but he reports that it did not improve his sleep and he discontinued its use after about a week. He is comfortable with his current sleep pattern and does not wish to add more medications. He continues to use his BiPAP machine regularly, even during daytime naps, and feels that it is providing good control of his sleep apnea.       ROS-see HPI  + = positive Constitutional:    +weight loss,  night sweats, fevers, chills, fatigue, lassitude. HEENT:    headaches, difficulty swallowing, tooth/dental problems, sore throat,       sneezing, itching, ear ache, nasal congestion, post nasal drip, snoring CV:    chest pain, orthopnea, PND, swelling in lower extremities, anasarca,                dizziness, palpitations Resp:   +shortness of breath with exertion or at rest.                productive cough,   +non-productive cough, coughing up of blood.              change in color of mucus.  wheezing.   Skin:    rash or lesions. GI:  No-   heartburn, indigestion, abdominal pain,  nausea, vomiting, diarrhea,                 change in bowel habits, loss of appetite GU: dysuria, change in color of urine, no urgency or frequency.   flank pain. MS:   joint pain, stiffness, decreased range of motion, back pain. Neuro-     nothing unusual Psych:  change in mood or affect.  depression or anxiety.   memory loss.  OBJ- Physical Exam General- Alert, Oriented, Affect-appropriate, Distress- none acute, +obese Skin- rash-none, lesions- none, excoriation- none Lymphadenopathy- none Head- atraumatic            Eyes- Gross vision intact, PERRLA, conjunctivae and secretions clear            Ears- Hearing, canals-normal            Nose- Clear, no-Septal dev, mucus, polyps, erosion, perforation             Throat- +S/P UPPP, Mallampati II , mucosa clear , drainage- none, tonsils- atrophic Neck- flexible , trachea midline, no stridor , thyroid  nl, carotid no bruit Chest - symmetrical excursion , unlabored           Heart/CV- RRR , no murmur , no gallop  , no rub, nl s1 s2                           - JVD- none , edema- none, stasis changes- none, varices- none           Lung- + Shallow, clear to P&A, wheeze- none, cough- none , dullness-none, rub- none           Chest wall-  Abd-  Br/ Gen/ Rectal- Not done, not indicated Extrem- cyanosis- none, clubbing, none, atrophy- none, strength- nl Neuro-   Assessment and Plan    Sleep Apnea Patient reports good compliance with BiPAP machine, even during daytime naps. Reports interrupted sleep due to nocturia but does not feel excessively tired. Clonazepam  was not effective for sleep and has been discontinued. -Continue current BiPAP use. -No changes to sleep medications.  Nocturnal Cough Patient reports a minor cough with small amounts of phlegm only at night when sitting up. No cough during the day or when lying down. No other respiratory complaints. -Continue to monitor symptoms.   Obesity -Weight loss encouraged  Follow-up No  acute issues. Patient is stable on current management. -Schedule annual follow-up visit or sooner if any issues arise.

## 2023-07-20 ENCOUNTER — Other Ambulatory Visit: Payer: Self-pay

## 2023-07-20 ENCOUNTER — Encounter: Payer: Self-pay | Admitting: Internal Medicine

## 2023-07-20 ENCOUNTER — Ambulatory Visit: Payer: Medicare HMO | Admitting: Internal Medicine

## 2023-07-20 VITALS — BP 120/60 | HR 102 | Ht 68.0 in | Wt 264.8 lb

## 2023-07-20 DIAGNOSIS — G4733 Obstructive sleep apnea (adult) (pediatric): Secondary | ICD-10-CM

## 2023-07-20 NOTE — Patient Instructions (Signed)
 You are doing well with BIPAP 18/14, PS 4  Please call if we can help

## 2023-08-17 ENCOUNTER — Other Ambulatory Visit: Payer: Self-pay | Admitting: Psychiatry

## 2023-08-17 DIAGNOSIS — Z125 Encounter for screening for malignant neoplasm of prostate: Secondary | ICD-10-CM | POA: Diagnosis not present

## 2023-08-17 DIAGNOSIS — E7800 Pure hypercholesterolemia, unspecified: Secondary | ICD-10-CM | POA: Diagnosis not present

## 2023-08-17 DIAGNOSIS — F319 Bipolar disorder, unspecified: Secondary | ICD-10-CM

## 2023-08-17 DIAGNOSIS — N183 Chronic kidney disease, stage 3 unspecified: Secondary | ICD-10-CM | POA: Diagnosis not present

## 2023-08-17 DIAGNOSIS — E039 Hypothyroidism, unspecified: Secondary | ICD-10-CM | POA: Diagnosis not present

## 2023-08-17 DIAGNOSIS — R202 Paresthesia of skin: Secondary | ICD-10-CM | POA: Diagnosis not present

## 2023-08-17 DIAGNOSIS — E1122 Type 2 diabetes mellitus with diabetic chronic kidney disease: Secondary | ICD-10-CM | POA: Diagnosis not present

## 2023-08-22 DIAGNOSIS — N182 Chronic kidney disease, stage 2 (mild): Secondary | ICD-10-CM | POA: Diagnosis not present

## 2023-08-22 DIAGNOSIS — N281 Cyst of kidney, acquired: Secondary | ICD-10-CM | POA: Diagnosis not present

## 2023-08-22 DIAGNOSIS — E1122 Type 2 diabetes mellitus with diabetic chronic kidney disease: Secondary | ICD-10-CM | POA: Diagnosis not present

## 2023-08-22 DIAGNOSIS — E785 Hyperlipidemia, unspecified: Secondary | ICD-10-CM | POA: Diagnosis not present

## 2023-08-22 DIAGNOSIS — F319 Bipolar disorder, unspecified: Secondary | ICD-10-CM | POA: Diagnosis not present

## 2023-08-22 DIAGNOSIS — N183 Chronic kidney disease, stage 3 unspecified: Secondary | ICD-10-CM | POA: Diagnosis not present

## 2023-08-22 DIAGNOSIS — I10 Essential (primary) hypertension: Secondary | ICD-10-CM | POA: Diagnosis not present

## 2023-08-22 DIAGNOSIS — G4733 Obstructive sleep apnea (adult) (pediatric): Secondary | ICD-10-CM | POA: Diagnosis not present

## 2023-08-22 DIAGNOSIS — E669 Obesity, unspecified: Secondary | ICD-10-CM | POA: Diagnosis not present

## 2023-08-24 DIAGNOSIS — N183 Chronic kidney disease, stage 3 unspecified: Secondary | ICD-10-CM | POA: Diagnosis not present

## 2023-08-24 DIAGNOSIS — F319 Bipolar disorder, unspecified: Secondary | ICD-10-CM | POA: Diagnosis not present

## 2023-08-24 DIAGNOSIS — Z6841 Body Mass Index (BMI) 40.0 and over, adult: Secondary | ICD-10-CM | POA: Diagnosis not present

## 2023-08-24 DIAGNOSIS — E7800 Pure hypercholesterolemia, unspecified: Secondary | ICD-10-CM | POA: Diagnosis not present

## 2023-08-24 DIAGNOSIS — G4733 Obstructive sleep apnea (adult) (pediatric): Secondary | ICD-10-CM | POA: Diagnosis not present

## 2023-08-24 DIAGNOSIS — E1122 Type 2 diabetes mellitus with diabetic chronic kidney disease: Secondary | ICD-10-CM | POA: Diagnosis not present

## 2023-08-24 DIAGNOSIS — E039 Hypothyroidism, unspecified: Secondary | ICD-10-CM | POA: Diagnosis not present

## 2023-08-24 DIAGNOSIS — Z0000 Encounter for general adult medical examination without abnormal findings: Secondary | ICD-10-CM | POA: Diagnosis not present

## 2023-09-14 DIAGNOSIS — F319 Bipolar disorder, unspecified: Secondary | ICD-10-CM | POA: Diagnosis not present

## 2023-09-15 LAB — LITHIUM LEVEL: Lithium Lvl: 1.1 mmol/L (ref 0.5–1.2)

## 2023-09-26 ENCOUNTER — Ambulatory Visit: Payer: Medicare HMO | Admitting: Psychiatry

## 2023-10-02 DIAGNOSIS — N1831 Chronic kidney disease, stage 3a: Secondary | ICD-10-CM | POA: Diagnosis not present

## 2023-10-02 DIAGNOSIS — E1165 Type 2 diabetes mellitus with hyperglycemia: Secondary | ICD-10-CM | POA: Diagnosis not present

## 2023-10-02 DIAGNOSIS — E1169 Type 2 diabetes mellitus with other specified complication: Secondary | ICD-10-CM | POA: Diagnosis not present

## 2023-10-02 DIAGNOSIS — E1159 Type 2 diabetes mellitus with other circulatory complications: Secondary | ICD-10-CM | POA: Diagnosis not present

## 2023-10-02 DIAGNOSIS — E039 Hypothyroidism, unspecified: Secondary | ICD-10-CM | POA: Diagnosis not present

## 2023-10-02 DIAGNOSIS — E1122 Type 2 diabetes mellitus with diabetic chronic kidney disease: Secondary | ICD-10-CM | POA: Diagnosis not present

## 2023-10-02 DIAGNOSIS — I152 Hypertension secondary to endocrine disorders: Secondary | ICD-10-CM | POA: Diagnosis not present

## 2023-10-02 DIAGNOSIS — E785 Hyperlipidemia, unspecified: Secondary | ICD-10-CM | POA: Diagnosis not present

## 2023-10-07 ENCOUNTER — Other Ambulatory Visit: Payer: Self-pay | Admitting: Psychiatry

## 2023-10-07 DIAGNOSIS — F319 Bipolar disorder, unspecified: Secondary | ICD-10-CM

## 2023-10-20 DIAGNOSIS — D044 Carcinoma in situ of skin of scalp and neck: Secondary | ICD-10-CM | POA: Diagnosis not present

## 2023-10-20 DIAGNOSIS — D2272 Melanocytic nevi of left lower limb, including hip: Secondary | ICD-10-CM | POA: Diagnosis not present

## 2023-10-20 DIAGNOSIS — D2271 Melanocytic nevi of right lower limb, including hip: Secondary | ICD-10-CM | POA: Diagnosis not present

## 2023-10-20 DIAGNOSIS — D2262 Melanocytic nevi of left upper limb, including shoulder: Secondary | ICD-10-CM | POA: Diagnosis not present

## 2023-10-20 DIAGNOSIS — L570 Actinic keratosis: Secondary | ICD-10-CM | POA: Diagnosis not present

## 2023-10-20 DIAGNOSIS — D2261 Melanocytic nevi of right upper limb, including shoulder: Secondary | ICD-10-CM | POA: Diagnosis not present

## 2023-10-20 DIAGNOSIS — D485 Neoplasm of uncertain behavior of skin: Secondary | ICD-10-CM | POA: Diagnosis not present

## 2023-10-20 DIAGNOSIS — D225 Melanocytic nevi of trunk: Secondary | ICD-10-CM | POA: Diagnosis not present

## 2023-10-20 DIAGNOSIS — L821 Other seborrheic keratosis: Secondary | ICD-10-CM | POA: Diagnosis not present

## 2023-11-22 ENCOUNTER — Ambulatory Visit: Admitting: Physician Assistant

## 2023-11-28 ENCOUNTER — Ambulatory Visit: Admitting: Physician Assistant

## 2023-11-29 ENCOUNTER — Other Ambulatory Visit: Payer: Self-pay | Admitting: Internal Medicine

## 2023-11-30 ENCOUNTER — Ambulatory Visit (INDEPENDENT_AMBULATORY_CARE_PROVIDER_SITE_OTHER): Admitting: Physician Assistant

## 2023-11-30 VITALS — BP 108/66 | HR 98

## 2023-11-30 DIAGNOSIS — N401 Enlarged prostate with lower urinary tract symptoms: Secondary | ICD-10-CM | POA: Diagnosis not present

## 2023-11-30 DIAGNOSIS — R32 Unspecified urinary incontinence: Secondary | ICD-10-CM | POA: Diagnosis not present

## 2023-11-30 DIAGNOSIS — N3941 Urge incontinence: Secondary | ICD-10-CM | POA: Diagnosis not present

## 2023-11-30 DIAGNOSIS — R3914 Feeling of incomplete bladder emptying: Secondary | ICD-10-CM

## 2023-11-30 LAB — URINALYSIS, COMPLETE
Bilirubin, UA: NEGATIVE
Ketones, UA: NEGATIVE
Leukocytes,UA: NEGATIVE
Nitrite, UA: NEGATIVE
Protein,UA: NEGATIVE
Specific Gravity, UA: 1.01 (ref 1.005–1.030)
Urobilinogen, Ur: 0.2 mg/dL (ref 0.2–1.0)
pH, UA: 6 (ref 5.0–7.5)

## 2023-11-30 LAB — MICROSCOPIC EXAMINATION: Bacteria, UA: NONE SEEN

## 2023-11-30 LAB — BLADDER SCAN AMB NON-IMAGING: Scan Result: 304

## 2023-11-30 MED ORDER — TAMSULOSIN HCL 0.4 MG PO CAPS
0.4000 mg | ORAL_CAPSULE | Freq: Every day | ORAL | 11 refills | Status: DC
Start: 2023-11-30 — End: 2023-12-19

## 2023-11-30 NOTE — Telephone Encounter (Signed)
**Note De-identified  [place] Obfuscation** Please advise 

## 2023-11-30 NOTE — Progress Notes (Unsigned)
 11/30/2023 3:07 PM   Carl Melton 1956-02-21 [id number]  CC: Chief Complaint  Patient presents with   Follow-up   HPI: Carl Melton is a 68 y.o. male with PMH OSA on CPAP, BPH, and LUTS including urge incontinence and nocturia on Myrbetriq  who presents today for ***.   Today he reports ***  In-office UA today positive for ***; urine microscopy with *** WBCs/HPF, *** RBCs/HPF, and ***. PVR ***mL.  PMH: Past Medical History:  Diagnosis Date   Bipolar 1 disorder (HCC)    Depression    Diabetes mellitus without complication (HCC)    type 2   Dysrhythmia    Hyperlipidemia    Hypertension    Hypothyroidism    LVH (left ventricular hypertrophy)    Morbid obesity (HCC)    Sleep apnea    on CPAP   Steatohepatitis     Surgical History: Past Surgical History:  Procedure Laterality Date   APPENDECTOMY     COLONOSCOPY  01/04/2013, 08/04/2009   COLONOSCOPY WITH PROPOFOL  N/A 08/06/2018   Procedure: COLONOSCOPY WITH PROPOFOL ;  Surgeon: Deveron Fly, MD;  Location[facility] ENDOSCOPY;  Service: Endoscopy;  Laterality: N/A;   COLONOSCOPY WITH PROPOFOL  N/A 05/11/2020   Procedure: COLONOSCOPY WITH PROPOFOL ;  Surgeon: Shane Darling, MD;  Location: [facility] ENDOSCOPY;  Service: Endoscopy;  Laterality: N/A;   ESOPHAGOGASTRODUODENOSCOPY  03/21/2002   KNEE SURGERY     UVULOPALATOPHARYNGOPLASTY  1997    Home Medications:  Allergies as of 11/30/2023       Reactions   Erythromycin Ethylsuccinate Rash   Simvastatin Other (See Comments), Rash        Medication List        Accurate as of Nov 30, 2023  3:07 PM. If you have any questions, ask your nurse or doctor.          STOP taking these medications    loratadine  10 MG tablet Commonly known as: CLARITIN    TRULICITY [place]       TAKE these medications    atorvastatin 80 MG tablet Commonly known as: LIPITOR Take 1 tablet by mouth daily.   clonazePAM  0.5 MG tablet Commonly known as: KLONOPIN  1 OR 2 TABS FOR  SLEEP AS NEEDED   felodipine 5 MG 24 hr tablet Commonly known as: PLENDIL 5 mg daily.   Jardiance 25 MG Tabs tablet Generic drug: empagliflozin Take 25 mg by mouth daily.   levothyroxine 100 MCG tablet Commonly known as: SYNTHROID Take 100 mcg by mouth daily before breakfast.   lisinopril 20 MG tablet Commonly known as: ZESTRIL Take 20 mg by mouth daily.   lithium  carbonate 450 MG ER tablet Commonly known as: ESKALITH  TAKE 2 TABLETS (900 MG TOTAL) BY MOUTH AT BEDTIME.   metFORMIN 500 MG 24 hr tablet Commonly known as: GLUCOPHAGE-XR Take 1,500 mg by mouth daily.   methocarbamol 500 MG tablet Commonly known as: ROBAXIN take 1 tablet at bed time as needed   mirabegron  ER 50 MG Tb24 tablet Commonly known as: Myrbetriq  Take 1 tablet (50 mg total) by mouth daily.   risperiDONE  1 MG tablet Commonly known as: RISPERDAL  TAKE 1 TABLET BY MOUTH EVERYDAY AT BEDTIME   Xigduo XR 04-999 MG Tb24 Generic drug: Dapagliflozin Pro-metFORMIN ER Take 1 tablet by mouth every morning.        Allergies:  Allergies  Allergen Reactions   Erythromycin Ethylsuccinate Rash   Simvastatin Other (See Comments) and Rash    Family History: No family history on  file.  Social History:   reports that he quit smoking about 43 years ago. His smoking use included cigarettes. He started smoking about 50 years ago. He has a 12 pack-year smoking history. He has been exposed to tobacco smoke. He has never used smokeless tobacco. He reports current alcohol use of about 4.0 standard drinks of alcohol per week. He reports that he does not use drugs.  Physical Exam: BP 108/66   Pulse 98   Constitutional:  Alert and oriented, no acute distress, nontoxic appearing HEENT: [place], AT Cardiovascular: No clubbing, cyanosis, or edema Respiratory: Normal respiratory effort, no increased work of breathing GI: Abdomen is soft, nontender, nondistended, no abdominal masses GU: No CVA tenderness Lymph: No  cervical or inguinal lymphadenopathy Skin: No rashes, bruises or suspicious lesions Neurologic: Grossly intact, no focal deficits, moving all 4 extremities Psychiatric: Normal mood and affect  Laboratory Data: Lab Results  Component Value Date   WBC 7.0 10/12/2022   HGB 13.2 10/12/2022   HCT 39.9 10/12/2022   MCV 89.7 10/12/2022   PLT 212 10/12/2022    Lab Results  Component Value Date   CREATININE 1.55 (H) 10/12/2022    CrCl cannot be calculated (Patient's most recent lab result is older than the maximum 21 days allowed.).  Results for orders placed or performed in visit on 11/30/23  BLADDER SCAN AMB NON-IMAGING   Collection Time: 11/30/23  2:48 PM  Result Value Ref Range   Scan Result 304 ml     Pertinent Imaging: KUB, ***: *** No results found for this or any previous visit.  No results found for this or any previous visit.  No results found for this or any previous visit.  No results found for this or any previous visit.  Results for orders placed during the hospital encounter of 05/02/23  US  RENAL  Narrative CLINICAL DATA:  type 2 diabetes w/CKD  EXAM: RENAL / URINARY TRACT ULTRASOUND COMPLETE  COMPARISON:  None Available.  FINDINGS: The right kidney measured 11.0 cm and the left kidney measured 12.0 cm. The kidneys demonstrate normal echogenicity. Right kidney midpole cyst measures 8 mm. Right kidney midpole cyst measures 12 mm. Renal cysts do not need imaging follow up. No shadowing stones are seen. No hydronephrosis.  Bladder:  Appears normal for degree of bladder distention.  IMPRESSION: Right kidney cysts.  Otherwise unremarkable study.   Electronically Signed By: Sydell Eva M.D. On: 05/26/2023 11:33  No results found for this or any previous visit.  No results found for this or any previous visit.  No results found for this or any previous visit.   I personally reviewed the images referenced above and note  ***.  Assessment & Plan:   1. Benign prostatic hyperplasia with incomplete bladder emptying (Primary) *** - BLADDER SCAN AMB NON-IMAGING - Urinalysis, Complete   No follow-ups on file.  Kathreen Pare, PA-C  [facility] [place] [address] [place], [place] [address] [phone]

## 2023-11-30 NOTE — Telephone Encounter (Signed)
 Clonazepam refilled

## 2023-11-30 NOTE — Patient Instructions (Signed)
 Cystoscopy Cystoscopy is a procedure that is used to help diagnose and sometimes treat conditions that affect the lower urinary tract. The lower urinary tract includes the bladder and the urethra. The urethra is the tube that drains urine from the bladder. Cystoscopy is done using a thin, tube-shaped instrument with a light and camera at the end (cystoscope). The cystoscope may be hard or flexible, depending on the goal of the procedure. The cystoscope is inserted through the urethra, into the bladder. Cystoscopy may be recommended if you have: Urinary tract infections that keep coming back. Blood in the urine (hematuria). An inability to control when you urinate (urinary incontinence) or an overactive bladder. Unusual cells found in a urine sample. A blockage in the urethra, such as a urinary stone. Painful urination. An abnormality in the bladder found during an intravenous pyelogram (IVP) or CT scan. What are the risks? Generally, this is a safe procedure. However, problems may occur, including: Infection. Bleeding.  What happens during the procedure?  You will be given one or more of the following: A medicine to numb the area (local anesthetic). The area around the opening of your urethra will be cleaned. The cystoscope will be passed through your urethra into your bladder. Germ-free (sterile) fluid will flow through the cystoscope to fill your bladder. The fluid will stretch your bladder so that your health care provider can clearly examine your bladder walls. Your doctor will look at the urethra and bladder. The cystoscope will be removed The procedure may vary among health care providers  What can I expect after the procedure? After the procedure, it is common to have: Some soreness or pain in your urethra. Urinary symptoms. These include: Mild pain or burning when you urinate. Pain should stop within a few minutes after you urinate. This may last for up to a few days after the  procedure. A small amount of blood in your urine for several days. Feeling like you need to urinate but producing only a small amount of urine. Follow these instructions at home: General instructions Return to your normal activities as told by your health care provider.  Drink plenty of fluids after the procedure. Keep all follow-up visits as told by your health care provider. This is important. Contact a health care provider if you: Have pain that gets worse or does not get better with medicine, especially pain when you urinate lasting longer than 72 hours after the procedure. Have trouble urinating. Get help right away if you: Have blood clots in your urine. Have a fever or chills. Are unable to urinate. Summary Cystoscopy is a procedure that is used to help diagnose and sometimes treat conditions that affect the lower urinary tract. Cystoscopy is done using a thin, tube-shaped instrument with a light and camera at the end. After the procedure, it is common to have some soreness or pain in your urethra. It is normal to have blood in your urine after the procedure.  If you were prescribed an antibiotic medicine, take it as told by your health care provider.  This information is not intended to replace advice given to you by your health care provider. Make sure you discuss any questions you have with your health care provider. Document Revised: 06/19/2018 Document Reviewed: 06/19/2018 Elsevier Patient Education  2020 [employer]vier

## 2023-12-07 DIAGNOSIS — L570 Actinic keratosis: Secondary | ICD-10-CM | POA: Diagnosis not present

## 2023-12-07 DIAGNOSIS — D044 Carcinoma in situ of skin of scalp and neck: Secondary | ICD-10-CM | POA: Diagnosis not present

## 2023-12-08 ENCOUNTER — Encounter: Admitting: Urology

## 2023-12-14 ENCOUNTER — Ambulatory Visit: Admitting: Psychiatry

## 2023-12-14 ENCOUNTER — Encounter: Payer: Self-pay | Admitting: Psychiatry

## 2023-12-14 DIAGNOSIS — F5105 Insomnia due to other mental disorder: Secondary | ICD-10-CM

## 2023-12-14 DIAGNOSIS — F319 Bipolar disorder, unspecified: Secondary | ICD-10-CM | POA: Diagnosis not present

## 2023-12-14 DIAGNOSIS — G20C Parkinsonism, unspecified: Secondary | ICD-10-CM | POA: Diagnosis not present

## 2023-12-14 DIAGNOSIS — G4733 Obstructive sleep apnea (adult) (pediatric): Secondary | ICD-10-CM

## 2023-12-14 DIAGNOSIS — G4734 Idiopathic sleep related nonobstructive alveolar hypoventilation: Secondary | ICD-10-CM

## 2023-12-14 DIAGNOSIS — E538 Deficiency of other specified B group vitamins: Secondary | ICD-10-CM | POA: Diagnosis not present

## 2023-12-14 DIAGNOSIS — R350 Frequency of micturition: Secondary | ICD-10-CM

## 2023-12-14 MED ORDER — RISPERIDONE 1 MG PO TABS
1.0000 mg | ORAL_TABLET | Freq: Every day | ORAL | 1 refills | Status: DC
Start: 2023-12-14 — End: 2024-05-17

## 2023-12-14 MED ORDER — CLONAZEPAM 0.5 MG PO TABS
0.7500 mg | ORAL_TABLET | Freq: Every day | ORAL | 2 refills | Status: DC
Start: 2023-12-14 — End: 2024-04-16

## 2023-12-14 MED ORDER — LITHIUM CARBONATE ER 450 MG PO TBCR
900.0000 mg | EXTENDED_RELEASE_TABLET | Freq: Every evening | ORAL | 0 refills | Status: DC
Start: 2023-12-14 — End: 2024-03-13

## 2023-12-14 NOTE — Progress Notes (Signed)
 Marrell Dicaprio Weyandt [id number] 06-08-1956 68 y.o.  Subjective:   Patient ID:  Carl Melton is a 68 y.o. (DOB 13-May-1956) male.  Chief Complaint:  Chief Complaint  Patient presents with   Follow-up    Mood and sleep    HPI Marcin Holte Cristobal presents to the office today for follow-up of bipolar disorder and sleep.    seen December 2020.  No meds were changed.  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    01/06/2020 appointment with the following noted: Continues lithium  450 mg 3 and 1/2 tablets daily, lorazepam  1.5 mg HS, Risperdal  1 mg HS. Vaccinated. OK overall.  Doing job well with good reports but getting harder to manage multiple pieces.  Want to work 2 more years after this one.  Has good assistants.  Has good rapport with coworkers.  Mood been OK.  Only thing he notices is tremor with fine motor things at times.  OK typing.  Done well with work.  Has been able to keep going forward.  Would like to retire sooner than 66 but not sure.  Would stay busy with retirement.   Sleep is pretty good and naps after work.  OK during the day.  Has to get up anyway bc nocturia and now having trouble going back too sleep for the last 3-4 mos. 2 nights out of the week. Most of the time feeling good.  No significant mood swings No unusual fear.  Sleep is better witn increase in lorazepam  without awakening.  Active.  Patient reports stable mood and denies depressed.  Patient denies difficulty with sleep initiation. Denies appetite disturbance.  Patient reports that energy and motivation have been good.  Patient denies any difficulty with concentration.  Patient denies any suicidal ideation.  Physical health stable. Plan; check lithium  level. No med changes  07/07/2020 appointment with the following noted:  Still undecided about retirement but probably a couple of years more.  Job is going OK. Occ a little agitated but not manic and no problems with  it.  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  More of it in the last year, bc of a couple of key people leaving but it's better now. No mood swings.   Not sleeping great.  DM is a problem causing nocturia 4-5 times nightly. No sig SE lithium . Patient reports stable mood and denies depressed or irritable moods.  Patient denies any recent difficulty with anxiety. Denies appetite disturbance.  Patient reports that energy and motivation have been good.  Patient denies any difficulty with concentration.  Patient denies any suicidal ideation. Plan: for nocutria switch lithium  to 900 mg AM and  1 and 1/2 of 450 mg tablets at night.  12/30/2020 appointment with the following noted: Fine with meds but some trouble sleeping but thinks it's stress.  [place] and [place] up once to urinate.  Home up 2-3 times nightly. Wonders if he has beginning PD with tremor and slower and stiffer. Worries too much.  Patient reports stable mood and denies depressed or irritable moods.  Patient denies any recent difficulty with anxiety.  Denies appetite disturbance.  Patient reports that energy and motivation have been good.  Patient denies any difficulty with concentration.  Patient denies any suicidal ideation. Plan:  No change indicated except for nocutria switch lithium  to 900 mg AM and  1 and 1/2 of 450 mg tablets at night. Disc  diagnosing PD vs EPS from risperidone .  Disc risk of lowering the dose.  Reduce risperidone  to 1/2 of 1 mg HS to see if Parkinsonism is better  03/29/21 appt noted: For 2 weeks on lower risperidone  OK and then depressed.  Increased to 1 mg HS and in 2 days he felt better. Tremor, slowness did not improve when reduced risperidone . Saw PCP thinks it's not PD Postional vertigo with head movment.  Facial paresthesias. Nocturia unchanged.  Asks about meds for it. No mood swings.   Patient reports stable mood and denies depressed or irritable moods.  Patient denies any  recent difficulty with anxiety.  Patient denies difficulty with sleep initiation or maintenance. Denies appetite disturbance.  Patient reports that energy and motivation have been good.  Patient denies any difficulty with concentration.  Patient denies any suicidal ideation. Tremor unchanged. Plan: Check lithium  level Check B12 for paresthesias. Continue lithium  900 mg every morning and 1-1/2 of the 450 mg tablets at night Continue risperidone  1 mg nightly  08/31/21 appt .   Pain in neck interfering.  On 3rd doc now.  Pending MRI.  Aggrivatiing. PT didn't help nor chiropracter.  Seeing ortho now. Sleeping problem.  To bed 830 and awake 0200 and back to bed after 30 min. Mood is ok.  No fear nor anxiety. Some daytime drowsiness.  Checking into new CPAP 12/17/21 will retire. Plaan: DC lorazepam   Alprazolam  0.5 mg HS Reduced risperidone  to 1/2 of 1 mg HS to see if Parkinsonism is better failed but more depressed. Therefore continue risperidone  1mg  HS  12/13/21 TC:  getting new bipap machine and on 2 liters O2 at night and appt with pulm Dr. Antonette Batters office 01/04/22 and 02/19/22  06/22/22 appt noted: FU with pulm 05/24/22 Dr. Linder Revere with further rec changes including O2 at night and get a new Bipap machine. Not using O2 at night bc noise of the machine.  [name] fusses at him for not using.   Psych meds: lithium  450 mg 1 and 1/2 tablets HS and 2 AM, risperidone  1 mg HS, lorazepam  0.5  mg 3 tabs HS Effort to try different sleep meds Xanax  and clonazepam  failed.  Lorazepam  lasts about 3 hours.   6-7 hours of broken sleep and thinks it is the bladder need to urinate. Mood has been ok overall except about sleep. More easily anxious than in the past. Plan: Repeat lithium  level.  He took ith this am about 545 AM and will check level about noon today. Then consider reduction.    06/22/22 lab: lihtium level is too high (1.8 but it was not a trough, but about 6 hours after dose).  Tell him to reduce lithium   to 1 in the Am and 2 at night of the 450 mg tablets..  09/21/2022 appointment noted: Some interrupted sleep cycles but ambien  and Lunesta  didn't make much difference. Mood is pretty steady without mania.  Carmelina Chinchilla is not complaining.   Sees Dr. Linder Revere re: OSA with O2 at night.  More compliant with CPAP.  Doesn't bother him as much. Continues meds.  No changes with reduction of lithium  to 450 AM and 900 pM unless more focused.   Lost wt from 315 to 260#. Plan: continue lithium  to 450 mg AM and 2 of 450 mg tablets at night.   Repeat lithium  level before next app   03/27/23 appt noted: Dr. Cato Cockayne Rx clonazepam  0.5 mg 1-2 mg HS for sleep. Psych med: lithium  ER 450 BID, risperidone  1 mg HS.  Mood has been good. Still some trouble with sleep. Helps with mother 74 yo.  Busier than he wants to be.   No problems with meds.    No SE with less lithium .   Off O2 at night bc it was irritating and noisy.  Saw Dr. Linder Revere last week.  Who said ok to stop it.   Using BIPAP.  12/14/23 appt noted: Psych med: lithium  ER 450 BID, risperidone  1 mg HS.clonazepam  0.5 mg  HS for sleep. Frequent nocturia.  Some prostatic hyperplasia and pending procedure.   Clonazepam  not working anymore.  To bed 9 and awake 1 and trouble going back to sleep.  Not as much urination frequency in the day but more than I should.  No mood swings or paranoia.   M living alone at 90 and he's running around caring for her.  Her mind is pretty good.   No other med concerns except sleep.     Past Psychiatric Medication Trials: Risperidone  1,  Geodon sedation, haloperidol, Prolixin, Trileptal, gabapentin, sertraline, Wellbutrin,  lithium , amiloride,  lorazepam , zolpidem  and Lunesta  NR Xanax  and clonazepam  HS ineffective  He has been under our care since December 1994.  He has had psychiatric hospitalization once.  He has been stable on lithium  and risperidone  since about 2007  Review of Systems:  Review of Systems  Constitutional:   Positive for fatigue.  HENT:  Positive for hearing loss and tinnitus.   Genitourinary:  Positive for frequency.  Musculoskeletal:  Positive for arthralgias, gait problem and neck pain.  Neurological:  Negative for dizziness, tremors and weakness.       Balance issues Facial paresthesia  Psychiatric/Behavioral:  Positive for sleep disturbance. Negative for agitation, behavioral problems, confusion, decreased concentration, dysphoric mood, hallucinations, self-injury and suicidal ideas. The patient is not nervous/anxious and is not hyperactive.     Medications: I have reviewed the patient's current medications.  Current Outpatient Medications  Medication Sig Dispense Refill   atorvastatin (LIPITOR) 80 MG tablet Take 1 tablet by mouth daily.     felodipine (PLENDIL) 5 MG 24 hr tablet 5 mg daily.   4   JARDIANCE 25 MG TABS tablet Take 25 mg by mouth daily.     levothyroxine (SYNTHROID) 100 MCG tablet Take 100 mcg by mouth daily before breakfast.     lisinopril (PRINIVIL,ZESTRIL) 20 MG tablet Take 20 mg by mouth daily.  5   metFORMIN (GLUCOPHAGE-XR) 500 MG 24 hr tablet Take 1,500 mg by mouth daily.  5   methocarbamol (ROBAXIN) 500 MG tablet take 1 tablet at bed time as needed     mirabegron  ER (MYRBETRIQ ) 50 MG TB24 tablet Take 1 tablet (50 mg total) by mouth daily. 30 tablet 11   tamsulosin  (FLOMAX ) 0.4 MG CAPS capsule Take 1 capsule (0.4 mg total) by mouth daily. 30 capsule 11   XIGDUO XR 04-999 MG TB24 Take 1 tablet by mouth every morning.     clonazePAM  (KLONOPIN ) 0.5 MG tablet Take 1.5 tablets (0.75 mg total) by mouth at bedtime. 45 tablet 2   lithium  carbonate (ESKALITH ) 450 MG ER tablet Take 2 tablets (900 mg total) by mouth at bedtime. 180 tablet 0   risperiDONE  (RISPERDAL ) 1 MG tablet Take 1 tablet (1 mg total) by mouth at bedtime. 90 tablet 1   Current Facility-Administered Medications  Medication Dose Route Frequency Provider Last Rate Last Admin   acetaminophen  (TYLENOL ) tablet  500 mg  500 mg Oral Once Ratcliffe, Gillermina Lacer, PA-C  Medication Side Effects: None  Allergies:  Allergies  Allergen Reactions   Erythromycin Ethylsuccinate Rash   Simvastatin Other (See Comments) and Rash    Past Medical History:  Diagnosis Date   Bipolar 1 disorder (HCC)    Depression    Diabetes mellitus without complication (HCC)    type 2   Dysrhythmia    Hyperlipidemia    Hypertension    Hypothyroidism    LVH (left ventricular hypertrophy)    Morbid obesity (HCC)    Sleep apnea    on CPAP   Steatohepatitis     History reviewed. No pertinent family history.  Social History   Socioeconomic History   Marital status: Married    Spouse name: Not on file   Number of children: Not on file   Years of education: Not on file   Highest education level: Not on file  Occupational History   Not on file  Tobacco Use   Smoking status: Former    Current packs/day: 0.00    Average packs/day: 2.0 packs/day for 6.0 years (12.0 ttl pk-yrs)    Types: Cigarettes    Start date: 12/11/1973    Quit date: 12/12/1979    Years since quitting: 44.0    Passive exposure: Past   Smokeless tobacco: Never  Vaping Use   Vaping status: Never Used  Substance and Sexual Activity   Alcohol use: Yes    Alcohol/week: 4.0 standard drinks of alcohol    Types: 4 Cans of beer per week   Drug use: Never   Sexual activity: Not on file  Other Topics Concern   Not on file  Social History Narrative   Not on file   Social Drivers of Health   Financial Resource Strain: Medium Risk (08/20/2023)   Received from [facility]   Overall Financial Resource Strain (CARDIA)    Difficulty of Paying Living Expenses: Somewhat hard  Food Insecurity: No Food Insecurity (08/20/2023)   Received from [facility]   Hunger Vital Sign    Worried About Running Out of Food in the Last Year: Never true    Ran Out of Food in the Last Year: Never true  Transportation Needs: No  Transportation Needs (08/20/2023)   Received from [facility] - Transportation    In the past 12 months, has lack of transportation kept you from medical appointments or from getting medications?: No    Lack of Transportation (Non-Medical): No  Physical Activity: Not on file  Stress: Not on file  Social Connections: Not on file  Intimate Partner Violence: Not on file    Past Medical History, Surgical history, Social history, and Family history were reviewed and updated as appropriate.   Please see review of systems for further details on the patient's review from today.   Objective:   Physical Exam:  There were no vitals taken for this visit.  Physical Exam Constitutional:      General: He is not in acute distress.    Appearance: He is obese.  Musculoskeletal:        General: No deformity.  Neurological:     Mental Status: He is alert and oriented to person, place, and time.     Motor: Abnormal muscle tone present.     Coordination: Coordination normal.     Gait: Gait abnormal.     Comments: A little smaller step length in gait. Mild-mod increase motor tone No sig tremor noticed  Psychiatric:  Attention and Perception: Attention and perception normal.        Mood and Affect: Mood is not anxious or depressed. Affect is not labile, blunt or angry.        Speech: Speech normal. Speech is not rapid and pressured.        Behavior: Behavior normal.        Thought Content: Thought content normal. Thought content is not paranoid or delusional. Thought content does not include homicidal or suicidal ideation. Thought content does not include suicidal plan.        Cognition and Memory: Cognition normal.        Judgment: Judgment normal.     Comments: Insight intact. No auditory or visual hallucinations. No delusions.  no drooling and alert     Lab Review:     Component Value Date/Time   NA 129 (L) 10/12/2022 1012   NA 138 03/31/2022 1140   K  3.3 (L) 10/12/2022 1012   CL 100 10/12/2022 1012   CO2 16 (L) 10/12/2022 1012   GLUCOSE 172 (H) 10/12/2022 1012   BUN 33 (H) 10/12/2022 1012   BUN 16 03/31/2022 1140   CREATININE 1.55 (H) 10/12/2022 1012   CALCIUM 8.8 (L) 10/12/2022 1012   PROT 7.1 10/12/2022 1012   PROT 7.4 03/31/2022 1140   ALBUMIN 3.6 10/12/2022 1012   ALBUMIN 5.0 (H) 03/31/2022 1140   AST 10 (L) 10/12/2022 1012   ALT 10 10/12/2022 1012   ALKPHOS 44 10/12/2022 1012   BILITOT 1.0 10/12/2022 1012   BILITOT 0.3 03/31/2022 1140   GFRNONAA 49 (L) 10/12/2022 1012   GFRAA 84 01/06/2020 0724       Component Value Date/Time   WBC 7.0 10/12/2022 1012   RBC 4.45 10/12/2022 1012   HGB 13.2 10/12/2022 1012   HCT 39.9 10/12/2022 1012   PLT 212 10/12/2022 1012   MCV 89.7 10/12/2022 1012   MCH 29.7 10/12/2022 1012   MCHC 33.1 10/12/2022 1012   RDW 13.1 10/12/2022 1012    Lithium  Lvl  Date Value Ref Range Status  09/14/2023 1.1 0.5 - 1.2 mmol/L Final    Comment:    A concentration of 0.5-0.8 mmol/L is advised for long-term use; concentrations of up to 1.2 mmol/L may be necessary during acute treatment.                                  Detection Limit = 0.1                           <0.1 indicates None Detected              Component Ref Range & Units 2 mo ago (01/23/23) 6 mo ago (09/27/22) 9 mo ago (06/22/22) 1 yr ago (10/01/21) 1 yr ago (03/30/21) 3 yr ago (01/06/20) 3 yr ago (07/18/19)  Lithium  Lvl 0.5 - 1.2 mmol/L 1.0 1.4 High Panic  CM 1.8 High Panic  CM 1.2 CM 1.4 High Panic  CM 1.1 CM 1.2 R, CM    09/14/23 lithium  1.0 on 900 mg. 08/17/23 normal BMP except gluocose 159, Cr 1.2  01/23/23 Lithium  level 1.0 on 900 mg daily.  10/01/21 Lithium  1.2 on 450 mg tablet 1 and 1/2 at night  No results found for: "PHENYTOIN", "PHENOBARB", "VALPROATE", "CBMZ"   BMP was January 31, 2018 and was within normal limits except for glucose 227,  TSH was 4.64, and lithium  level was 0.8 which is stable.  .res Assessment: Plan:     Savva was seen today for follow-up.  Diagnoses and all orders for this visit:  Bipolar I disorder (HCC) -     clonazePAM  (KLONOPIN ) 0.5 MG tablet; Take 1.5 tablets (0.75 mg total) by mouth at bedtime. -     lithium  carbonate (ESKALITH ) 450 MG ER tablet; Take 2 tablets (900 mg total) by mouth at bedtime. -     risperiDONE  (RISPERDAL ) 1 MG tablet; Take 1 tablet (1 mg total) by mouth at bedtime.  Obstructive sleep apnea syndrome, severe  Insomnia due to mental condition -     clonazePAM  (KLONOPIN ) 0.5 MG tablet; Take 1.5 tablets (0.75 mg total) by mouth at bedtime.  Nocturnal hypoxemia  Urinary frequency  Low serum vitamin B12  Parkinsonism, unspecified Parkinsonism type (HCC)   30 min face to face time with patient We discussed the following: Long term benefit lithium  and risperidone  and is stable with past history of psychotic features if not on antipsychotic.  We discussed the short-term risks associated with benzodiazepines including sedation and increased fall risk among others.  Discussed long-term side effect risk including dependence, potential withdrawal symptoms, and the potential eventual dose-related risk of dementia.  But recent studies from 2020 dispute this association between benzodiazepines and dementia risk. Newer studies in 2020 do not support an association with dementia.  Insomnia with broken sleep and failed alternatives.  Likely at part related to poorly treated OSA. Increase clonazepam  0.75 mg HS  continue lithium  to 450 mg BID with level 1.0 better.   Repeat lithium  level before next app Counseled patient regarding potential benefits, risks, and side effects of lithium  to include potential risk of lithium  affecting thyroid  and renal function.  Discussed need for periodic lab monitoring to determine drug level and to assess for potential adverse effects.  Counseled patient regarding signs and symptoms of lithium  toxicity and advised that they notify office  immediately or seek urgent medical attention if experiencing these signs and symptoms.  Patient advised to contact office with any questions or concerns.  Less movement problems.  Reduced risperidone  to 1/2 of 1 mg HS to see if Parkinsonism is better failed but more depressed.   Therefore continue risperidone  1mg  HS  Extensive disc of severe sleep apnea importance of treating it for brain health.  Option amiloride DT U frequency.  He wants to wait and see Uro first.  FU 4 mos  Nori Beat, MD, DFAPA    Please see After Visit Summary for patient specific instructions.  Future Appointments  Date Time Provider Department Center  12/19/2023  1:15 PM Lawerence Pressman, MD [facility] None  07/19/2024 11:00 AM Rosa College D, MD [facility] None     No orders of the defined types were placed in this encounter.      -------------------------------

## 2023-12-19 ENCOUNTER — Other Ambulatory Visit: Payer: Self-pay

## 2023-12-19 ENCOUNTER — Ambulatory Visit (INDEPENDENT_AMBULATORY_CARE_PROVIDER_SITE_OTHER): Admitting: Urology

## 2023-12-19 VITALS — BP 104/66 | HR 86 | Ht 68.0 in | Wt 263.0 lb

## 2023-12-19 DIAGNOSIS — K5909 Other constipation: Secondary | ICD-10-CM | POA: Diagnosis not present

## 2023-12-19 DIAGNOSIS — N401 Enlarged prostate with lower urinary tract symptoms: Secondary | ICD-10-CM | POA: Diagnosis not present

## 2023-12-19 DIAGNOSIS — R338 Other retention of urine: Secondary | ICD-10-CM

## 2023-12-19 DIAGNOSIS — R399 Unspecified symptoms and signs involving the genitourinary system: Secondary | ICD-10-CM | POA: Diagnosis not present

## 2023-12-19 DIAGNOSIS — R112 Nausea with vomiting, unspecified: Secondary | ICD-10-CM | POA: Diagnosis not present

## 2023-12-19 DIAGNOSIS — N3281 Overactive bladder: Secondary | ICD-10-CM | POA: Diagnosis not present

## 2023-12-19 DIAGNOSIS — N138 Other obstructive and reflux uropathy: Secondary | ICD-10-CM

## 2023-12-19 DIAGNOSIS — R197 Diarrhea, unspecified: Secondary | ICD-10-CM | POA: Diagnosis not present

## 2023-12-19 NOTE — Patient Instructions (Addendum)
 Nocturia refers to the need to wake up during the night to urinate, which can disrupt your sleep and impact your overall well-being. Fortunately, there are several strategies you can employ to help prevent or manage nocturia. It's important to consult with your healthcare provider before making any significant changes to your routine. Here are some helpful strategies to consider:  Limit Fluid Intake Before Bed: Avoid drinking large amounts of fluids in the evening, especially within a few hours of bedtime. Consume most of your daily fluid intake earlier in the day to reduce the need to urinate at night.  Monitor Your Diet: Limit your intake of caffeine and alcohol, as these substances can increase urine production and irritate the bladder.  Avoid diet, "zero calorie," and artificially sweetened drinks, especially sodas, in the afternoon or evening. Be mindful of consuming foods and drinks with high water content before bedtime, such as watermelon and herbal teas.  Time Your Medications: If you're taking medications that contribute to increased urination, consult your healthcare provider about adjusting the timing of these medications to minimize their impact during the night.  Practice Double Voiding: Before going to bed, make an effort to empty your bladder twice within a short period. This can help reduce the amount of urine left in your bladder before sleep.  Bladder Training: Gradually increase the time between bathroom visits during the day to train your bladder to hold larger volumes of urine. Over time, this can help reduce the frequency of nighttime awakenings to urinate.  Elevate Your Legs During the Day: Elevating your legs during the day can help minimize fluid retention in your lower extremities, which might reduce nighttime urination.  Pelvic Floor Exercises: Strengthening your pelvic floor muscles through Kegel exercises can help improve bladder control and potentially reduce  the urge to urinate at night.  Create a Relaxing Bedtime Routine: Stress and anxiety can exacerbate nocturia. Engage in calming activities before bed, such as reading, listening to soothing music, or practicing relaxation techniques.  Stay Active: Engage in regular physical activity, but avoid intense exercise close to bedtime, as this can increase your body's demand for fluids.  Maintain a Healthy Weight: Excess weight can compress the bladder and contribute to bladder and urinary issues. Aim to achieve and maintain a healthy weight through a balanced diet and regular exercise.  Remember that every individual is unique, and the effectiveness of these strategies may vary. It's important to work with your healthcare provider to develop a plan that suits your specific needs and addresses any underlying causes of nocturia.    Prostatic Urethral Lift(UroLift)  Prostatic urethral lift is a surgical procedure to treat symptoms of prostate gland enlargement that occurs with age (benign prostatic hypertrophy, BPH). The urethra passes between the two lobes of the prostate. The urethra is the part of the body that drains urine from the bladder. As the prostate enlarges, it can push on the urethra and cause problems with urinating. This procedure involves placing an implant that holds the prostate away from the urethra. The procedure is done using a thin device called a cystoscope. The device is inserted through the tip of the penis and moved up the urethra to the prostate. This is less invasive than other procedures that require an incision. You may have this procedure if: You have symptoms of BPH. Your prostate is not severely enlarged. Medicines to treat BPH are not working or not tolerated. You want to avoid possible sexual side effects from medicines or other procedures  that are used to treat BPH. Tell a health care provider about: Any allergies you have. All medicines you are taking, including  vitamins, herbs, eye drops, creams, and over-the-counter medicines. Any problems you or family members have had with anesthetic medicines. Any bleeding problems you have. Any surgeries you have had. Any medical conditions you have. What are the risks? Generally, this is a safe procedure. However, problems may occur, including: Bleeding. Infection. Leaking of urine (incontinence). Allergic reactions to medicines. Return of BPH symptoms after 2 years, requiring more treatment. What happens before the procedure? When to stop eating and drinking Follow instructions from your health care provider about what you may eat and drink before your procedure. These may include: 8 hours before your procedure Stop eating most foods. Do not eat meat, fried foods, or fatty foods. Eat only light foods, such as toast or crackers. All liquids are okay except energy drinks and alcohol. 6 hours before your procedure Stop eating. Drink only clear liquids, such as water, clear fruit juice, black coffee, plain tea, and sports drinks. Do not drink energy drinks or alcohol. 2 hours before your procedure Stop drinking all liquids. You may be allowed to take medicines with small sips of water. If you do not follow your health care provider's instructions, your procedure may be delayed or canceled. Medicines Ask your health care provider about: Changing or stopping your regular medicines. This is especially important if you are taking diabetes medicines or blood thinners. Taking medicines such as aspirin and ibuprofen. These medicines can thin your blood. Do not take these medicines unless your health care provider tells you to take them. Taking over-the-counter medicines, vitamins, herbs, and supplements. Surgery safety Ask your health care provider what steps will be taken to help prevent infection. These steps may include: Removing hair at the surgery site. Washing skin with a germ-killing soap. Taking  antibiotic medicine. General instructions Do not use any products that contain nicotine or tobacco for at least 4 weeks before the procedure. These products include cigarettes, chewing tobacco, and vaping devices, such as e-cigarettes. If you need help quitting, ask your health care provider. If you will be going home right after the procedure, plan to have a responsible adult: Take you home from the hospital or clinic. You will not be allowed to drive. Care for you for the time you are told. What happens during the procedure? An IV may be inserted into one of your veins. You will be given one or more of the following: A medicine to help you relax (sedative). A medicine that is injected into your urethra to numb the area (local anesthetic). A medicine to make you fall asleep (general anesthetic). A cystoscope will be inserted into your penis and moved through your urethra to your prostate. A device will be inserted through the cystoscope and used to press the lobes of your prostate away from your urethra. Implants will be inserted through the device to hold the lobes of your prostate in the widened position. The device and cystoscope will be removed. The procedure may vary among health care providers and hospitals. What happens after the procedure? Your blood pressure, heart rate, breathing rate, and blood oxygen  level be monitored until you leave the hospital or clinic. If you were given a sedative during the procedure, it can affect you for several hours. Do not drive or operate machinery until your health care provider says that it is safe. Summary Prostatic urethral lift is a surgical procedure to  relieve symptoms of prostate gland enlargement that occurs with age (benign prostatic hypertrophy, BPH). The procedure is performed with a thin device called a cystoscope. This device is inserted through the tip of the penis and moved up the urethra to reach the prostate. This is less invasive  than other procedures that require an incision. If you will be going home right after the procedure, plan to have a responsible adult take you home from the hospital or clinic. You will not be allowed to drive. This information is not intended to replace advice given to you by your health care provider. Make sure you discuss any questions you have with your health care provider. Document Revised: 01/22/2021 Document Reviewed: 01/22/2021 Elsevier Patient Education  2024 [employer].

## 2023-12-19 NOTE — Progress Notes (Signed)
 Cystoscopy Procedure Note:  Indication: Mixed urinary symptoms  Comorbid 68 year old male on lithium , poorly controlled diabetes with glucosuria with mixed urinary symptoms including urgency, frequency, nocturia 5x despite compliance with CPAP, and incomplete emptying with most recent PVR .  Reports no improvement on Myrbetriq  or Flomax  previously.  After informed consent and discussion of the procedure and its risks, Carl Melton was positioned and prepped in the standard fashion. Cystoscopy was performed with a flexible cystoscope. The urethra, bladder neck and entire bladder was visualized in a standard fashion. The prostate was short and narrow/tight. The ureteral orifices were visualized in their normal location and orientation.  Moderate bladder trabeculations, distended bladder despite just voiding, no abnormalities on retroflexion, no median lobe.  Imaging: Transrectal ultrasound was inserted into the rectum and measurements taken for a calculated prostate volume of 18g  Findings: Distended bladder with moderate trabeculations, tight and small prostate  ----------------------------------------------------------------------------  Assessment and Plan: 68 year old male on lithium , poorly controlled diabetes with glucosuria, mixed urinary symptoms with urgency, frequency, nocturia 5 times despite CPAP use, and incomplete emptying with PVR greater than .  Cystoscopy today shows distended bladder with moderate trabeculations and incomplete emptying despite just voiding, small but tight and narrow prostate.  Likely has a component of incomplete emptying from obstruction, in addition to some overactive symptoms from his lithium , diabetes/glucosuria, and intake of bladder irritants.  I think very reasonable to start with an outlet procedure with very low risk like UroLift especially with his small 18 g prostate to improve his emptying, and then work on his overactive symptoms.   Realistic expectations and behavioral strategies discussed extensively today.  Could also consider amiloride in the future to offset the lithium .  Schedule UroLift  Jay Meth, MD 12/19/2023

## 2023-12-19 NOTE — Progress Notes (Signed)
 Surgical Physician Order Form [facility]  Dr. Jay Meth, MD  * Scheduling expectation : Next Available  *Length of Case: 30 minutes  *Clearance needed: no  *Anticoagulation Instructions: Hold all anticoagulants  *Aspirin Instructions: Hold Aspirin  *Post-op visit Date/Instructions:  1 month follow up MD PVR  *Diagnosis: BPH w/urinary obstruction  *Procedure: UroLift   Additional orders: N/A  -Admit type: OUTpatient  -Anesthesia: General  -VTE Prophylaxis Standing Order SCD's       Other:   -Standing Lab Orders Per Anesthesia    Lab other: UA&Urine Culture  -Standing Test orders EKG/Chest x-ray per Anesthesia       Test other:   - Medications:  Ancef 2gm IV  -Other orders:  N/A

## 2023-12-21 DIAGNOSIS — E1122 Type 2 diabetes mellitus with diabetic chronic kidney disease: Secondary | ICD-10-CM | POA: Diagnosis not present

## 2023-12-21 DIAGNOSIS — N183 Chronic kidney disease, stage 3 unspecified: Secondary | ICD-10-CM | POA: Diagnosis not present

## 2023-12-21 DIAGNOSIS — E7800 Pure hypercholesterolemia, unspecified: Secondary | ICD-10-CM | POA: Diagnosis not present

## 2023-12-22 ENCOUNTER — Telehealth: Payer: Self-pay

## 2023-12-22 NOTE — Progress Notes (Signed)
   [facility] Surgical Posting Form  Surgery Date: Date: 01/05/2024  Surgeon: Dr. Jay Meth, MD  Inpt ( No  )   Outpt (Yes)   Obs ( No  )   Diagnosis: N40.1, N13.8 Benign Prostatic Hyperplasia with Urinary Obstruction  -CPT: 52441, [phone]  Surgery: Cystoscopy with Insertion of Urolift   Stop Anticoagulations: Yes and also hold ASA  Cardiac/Medical/Pulmonary Clearance needed: no  *Orders entered into EPIC  Date: 12/22/23   *Case booked in EPIC  Date: 12/20/23  *Notified pt of Surgery: Date: 12/20/2023  PRE-OP UA & CX: yes, will obtain in clinic on 12/26/23  *Placed into Prior Authorization Work [place] Date: 12/22/23  Assistant/laser/rep:No

## 2023-12-22 NOTE — Telephone Encounter (Signed)
 Per Dr. Estanislao Heimlich, Patient is to be scheduled for Cystoscopy with Insertion of Urolift   Carl Melton was contacted and possible surgical dates were discussed, Friday June 27th, 2025 was agreed upon for surgery.   Patient was instructed that Dr. Estanislao Heimlich will require them to provide a pre-op UA & CX prior to surgery. This was ordered and scheduled drop off appointment was made for 12/26/2023.    Patient was directed to call [phone] between 1-3pm the day before surgery to find out surgical arrival time.  Instructions were given not to eat or drink from midnight on the night before surgery and have a driver for the day of surgery. On the surgery day patient was instructed to enter through the Medical Mall entrance of [facility] report the Same Day Surgery desk.   Pre-Admit Testing will be in contact via phone to set up an interview with the anesthesia team to review your history and medications prior to surgery.   Reminder of this information was sent via MyChart to the patient.

## 2023-12-26 ENCOUNTER — Other Ambulatory Visit

## 2023-12-26 DIAGNOSIS — N138 Other obstructive and reflux uropathy: Secondary | ICD-10-CM

## 2023-12-26 DIAGNOSIS — N401 Enlarged prostate with lower urinary tract symptoms: Secondary | ICD-10-CM | POA: Diagnosis not present

## 2023-12-26 LAB — URINALYSIS, COMPLETE
Bilirubin, UA: NEGATIVE
Ketones, UA: NEGATIVE
Leukocytes,UA: NEGATIVE
Nitrite, UA: NEGATIVE
Protein,UA: NEGATIVE
RBC, UA: NEGATIVE
Specific Gravity, UA: 1.01 (ref 1.005–1.030)
Urobilinogen, Ur: 0.2 mg/dL (ref 0.2–1.0)
pH, UA: 6 (ref 5.0–7.5)

## 2023-12-26 LAB — MICROSCOPIC EXAMINATION: Bacteria, UA: NONE SEEN

## 2023-12-29 ENCOUNTER — Encounter
Admission: RE | Admit: 2023-12-29 | Discharge: 2023-12-29 | Disposition: A | Discharge status: Home / Self Care | Source: Ambulatory Visit | Attending: Urology | Admitting: Urology

## 2023-12-29 ENCOUNTER — Other Ambulatory Visit: Payer: Self-pay

## 2023-12-29 VITALS — Ht 68.0 in | Wt 266.0 lb

## 2023-12-29 DIAGNOSIS — K7581 Nonalcoholic steatohepatitis (NASH): Secondary | ICD-10-CM

## 2023-12-29 DIAGNOSIS — E119 Type 2 diabetes mellitus without complications: Secondary | ICD-10-CM

## 2023-12-29 DIAGNOSIS — I119 Hypertensive heart disease without heart failure: Secondary | ICD-10-CM

## 2023-12-29 DIAGNOSIS — E7800 Pure hypercholesterolemia, unspecified: Secondary | ICD-10-CM

## 2023-12-29 DIAGNOSIS — R0609 Other forms of dyspnea: Secondary | ICD-10-CM

## 2023-12-29 DIAGNOSIS — G4734 Idiopathic sleep related nonobstructive alveolar hypoventilation: Secondary | ICD-10-CM

## 2023-12-29 HISTORY — DX: Obstructive sleep apnea (adult) (pediatric): G47.33

## 2023-12-29 HISTORY — DX: Type 2 diabetes mellitus without complications: E11.9

## 2023-12-29 NOTE — Patient Instructions (Addendum)
 Your procedure is scheduled on: Friday 01/05/24 To find out your arrival time, please call [phone] between 1PM - 3PM on: Thursday 01/04/24   Report to the Registration Desk on the 1st floor of the Medical Mall. Free Valet parking is available.  If your arrival time is 6:00 am, do not arrive before that time as the Medical Mall entrance doors do not open until 6:00 am.  REMEMBER: Instructions that are not followed completely may result in serious medical risk, up to and including death; or upon the discretion of your surgeon and anesthesiologist your surgery may need to be rescheduled.  Do not eat food or drink any liquids after midnight the night before surgery.  No gum chewing or hard candies.  One week prior to surgery: Stop Anti-inflammatories (NSAIDS) such as Advil, Aleve, Ibuprofen, Motrin, Naproxen, Naprosyn and Aspirin based products such as Excedrin, Goody's Powder, BC Powder. You may however, continue to take Tylenol  if needed for pain up until the day of surgery.  Stop ANY OVER THE COUNTER supplements and vitamins for 7 days until after surgery.  Continue taking all prescribed medications with the exception of the following: Ozempic, skip Monday 01/01/24 dose (can restart after procedure). Jardiance hold dose starting Tuesday 01/02/24. Metformin hold dose starting Wednesday 01/03/24.  TAKE ONLY THESE MEDICATIONS THE MORNING OF SURGERY WITH A SIP OF WATER:  none  No Alcohol for 24 hours before or after surgery.  No Smoking including e-cigarettes for 24 hours before surgery.  No chewable tobacco products for at least 6 hours before surgery.  No nicotine patches on the day of surgery.  Do not use any recreational drugs for at least a week (preferably 2 weeks) before your surgery.  Please be advised that the combination of cocaine and anesthesia may have negative outcomes, up to and including death. If you test positive for cocaine, your surgery will be cancelled.  On the  morning of surgery brush your teeth with toothpaste and water, you may rinse your mouth with mouthwash if you wish. Do not swallow any toothpaste or mouthwash.  Shower before arrival for surgery  Do not wear lotions, powders, or perfumes.   Do not shave body hair from the neck down 48 hours before surgery.  Wear comfortable clothing (specific to your surgery type) to the hospital.  Do not wear jewelry, make-up, hairpins, clips or nail polish.  For welded (permanent) jewelry: bracelets, anklets, waist bands, etc.  Please have this removed prior to surgery.  If it is not removed, there is a chance that hospital personnel will need to cut it off on the day of surgery. Contact lenses, hearing aids and dentures may not be worn into surgery.  Bring your C-PAP/BiPap to the hospital in case you may have to spend the night.   Do not bring valuables to the hospital. [facility] is not responsible for any missing/lost belongings or valuables.   Notify your doctor if there is any change in your medical condition (cold, fever, infection).  If you are being discharged the day of surgery, you will not be allowed to drive home. You will need a responsible individual to drive you home and stay with you for 24 hours after surgery.   If you are taking public transportation, you will need to have a responsible individual with you.  If you are being admitted to the hospital overnight, leave your suitcase in the car. After surgery it may be brought to your room.  In case of  increased patient census, it may be necessary for you, the patient, to continue your postoperative care in the Same Day Surgery department.  After surgery, you can help prevent lung complications by doing breathing exercises.  Take deep breaths and cough every 1-2 hours. Your doctor may order a device called an Incentive Spirometer to help you take deep breaths  Surgery Visitation Policy:  Patients undergoing a surgery or procedure  may have two family members or support persons with them as long as the person is not COVID-19 positive or experiencing its symptoms.   Please call the Pre-admissions Testing Dept. at [phone] if you have any questions about these instructions.

## 2023-12-30 LAB — CULTURE, URINE COMPREHENSIVE

## 2024-01-01 ENCOUNTER — Encounter: Payer: Self-pay | Admitting: Urgent Care

## 2024-01-01 ENCOUNTER — Encounter
Admission: RE | Admit: 2024-01-01 | Discharge: 2024-01-01 | Disposition: A | Discharge status: Home / Self Care | Source: Ambulatory Visit | Attending: Urology | Admitting: Urology

## 2024-01-01 DIAGNOSIS — G4734 Idiopathic sleep related nonobstructive alveolar hypoventilation: Secondary | ICD-10-CM | POA: Diagnosis not present

## 2024-01-01 DIAGNOSIS — Z01818 Encounter for other preprocedural examination: Secondary | ICD-10-CM | POA: Diagnosis not present

## 2024-01-01 DIAGNOSIS — I119 Hypertensive heart disease without heart failure: Secondary | ICD-10-CM | POA: Insufficient documentation

## 2024-01-01 DIAGNOSIS — R0609 Other forms of dyspnea: Secondary | ICD-10-CM | POA: Diagnosis not present

## 2024-01-01 DIAGNOSIS — E7800 Pure hypercholesterolemia, unspecified: Secondary | ICD-10-CM | POA: Insufficient documentation

## 2024-01-01 DIAGNOSIS — R9431 Abnormal electrocardiogram [ECG] [EKG]: Secondary | ICD-10-CM | POA: Diagnosis not present

## 2024-01-01 DIAGNOSIS — E119 Type 2 diabetes mellitus without complications: Secondary | ICD-10-CM | POA: Insufficient documentation

## 2024-01-01 DIAGNOSIS — K7581 Nonalcoholic steatohepatitis (NASH): Secondary | ICD-10-CM | POA: Diagnosis not present

## 2024-01-01 DIAGNOSIS — Z01812 Encounter for preprocedural laboratory examination: Secondary | ICD-10-CM | POA: Diagnosis present

## 2024-01-01 DIAGNOSIS — Z01810 Encounter for preprocedural cardiovascular examination: Secondary | ICD-10-CM | POA: Diagnosis not present

## 2024-01-01 LAB — CBC
HCT: 41.7 % (ref 39.0–52.0)
Hemoglobin: 13.8 g/dL (ref 13.0–17.0)
MCH: 30.2 pg (ref 26.0–34.0)
MCHC: 33.1 g/dL (ref 30.0–36.0)
MCV: 91.2 fL (ref 80.0–100.0)
Platelets: 224 10*3/uL (ref 150–400)
RBC: 4.57 MIL/uL (ref 4.22–5.81)
RDW: 13.5 % (ref 11.5–15.5)
WBC: 6.8 10*3/uL (ref 4.0–10.5)
nRBC: 0 % (ref 0.0–0.2)

## 2024-01-01 LAB — COMPREHENSIVE METABOLIC PANEL WITH GFR
ALT: 16 U/L (ref 0–44)
AST: 16 U/L (ref 15–41)
Albumin: 4.4 g/dL (ref 3.5–5.0)
Alkaline Phosphatase: 46 U/L (ref 38–126)
Anion gap: 10 (ref 5–15)
BUN: 26 mg/dL — ABNORMAL HIGH (ref 8–23)
CO2: 20 mmol/L — ABNORMAL LOW (ref 22–32)
Calcium: 9.2 mg/dL (ref 8.9–10.3)
Chloride: 108 mmol/L (ref 98–111)
Creatinine, Ser: 1.14 mg/dL (ref 0.61–1.24)
GFR, Estimated: >60 mL/min (ref >60)
Glucose, Bld: 308 mg/dL — ABNORMAL HIGH (ref 70–99)
Potassium: 4.6 mmol/L (ref 3.5–5.1)
Sodium: 138 mmol/L (ref 135–145)
Total Bilirubin: 0.8 mg/dL (ref 0.0–1.2)
Total Protein: 7.7 g/dL (ref 6.5–8.1)

## 2024-01-02 ENCOUNTER — Ambulatory Visit: Payer: Self-pay | Admitting: Physician Assistant

## 2024-01-05 ENCOUNTER — Other Ambulatory Visit: Payer: Self-pay

## 2024-01-05 ENCOUNTER — Encounter: Payer: Self-pay | Admitting: Urology

## 2024-01-05 ENCOUNTER — Ambulatory Visit
Admission: RE | Admit: 2024-01-05 | Discharge: 2024-01-05 | Disposition: A | Discharge status: Home / Self Care | Attending: Urology | Admitting: Urology

## 2024-01-05 ENCOUNTER — Ambulatory Visit: Payer: Self-pay | Admitting: Urgent Care

## 2024-01-05 ENCOUNTER — Encounter
Admission: RE | Disposition: A | Discharge status: Home / Self Care | Payer: Self-pay | Source: Home / Self Care | Attending: Urology

## 2024-01-05 ENCOUNTER — Ambulatory Visit: Admitting: Anesthesiology

## 2024-01-05 DIAGNOSIS — N401 Enlarged prostate with lower urinary tract symptoms: Secondary | ICD-10-CM | POA: Diagnosis not present

## 2024-01-05 DIAGNOSIS — E1165 Type 2 diabetes mellitus with hyperglycemia: Secondary | ICD-10-CM | POA: Diagnosis not present

## 2024-01-05 DIAGNOSIS — I10 Essential (primary) hypertension: Secondary | ICD-10-CM | POA: Diagnosis not present

## 2024-01-05 DIAGNOSIS — Z794 Long term (current) use of insulin: Secondary | ICD-10-CM | POA: Insufficient documentation

## 2024-01-05 DIAGNOSIS — Z6839 Body mass index (BMI) 39.0-39.9, adult: Secondary | ICD-10-CM | POA: Diagnosis not present

## 2024-01-05 DIAGNOSIS — G4733 Obstructive sleep apnea (adult) (pediatric): Secondary | ICD-10-CM | POA: Diagnosis not present

## 2024-01-05 DIAGNOSIS — N3289 Other specified disorders of bladder: Secondary | ICD-10-CM | POA: Diagnosis not present

## 2024-01-05 DIAGNOSIS — Z79899 Other long term (current) drug therapy: Secondary | ICD-10-CM | POA: Insufficient documentation

## 2024-01-05 DIAGNOSIS — Z87891 Personal history of nicotine dependence: Secondary | ICD-10-CM | POA: Diagnosis not present

## 2024-01-05 DIAGNOSIS — N138 Other obstructive and reflux uropathy: Secondary | ICD-10-CM | POA: Diagnosis not present

## 2024-01-05 DIAGNOSIS — I119 Hypertensive heart disease without heart failure: Secondary | ICD-10-CM | POA: Diagnosis not present

## 2024-01-05 DIAGNOSIS — F319 Bipolar disorder, unspecified: Secondary | ICD-10-CM | POA: Insufficient documentation

## 2024-01-05 DIAGNOSIS — E119 Type 2 diabetes mellitus without complications: Secondary | ICD-10-CM

## 2024-01-05 HISTORY — PX: CYSTOSCOPY WITH INSERTION OF UROLIFT: SHX6678

## 2024-01-05 LAB — GLUCOSE, CAPILLARY
Glucose-Capillary: 257 mg/dL — ABNORMAL HIGH (ref 70–99)
Glucose-Capillary: 231 mg/dL — ABNORMAL HIGH (ref 70–99)

## 2024-01-05 SURGERY — CYSTOSCOPY WITH INSERTION OF UROLIFT
Anesthesia: General | Site: Prostate

## 2024-01-05 MED ORDER — SODIUM CHLORIDE 0.9 % IV SOLN
INTRAVENOUS | Status: DC
Start: 2024-01-05 — End: 2024-01-05

## 2024-01-05 MED ORDER — ACETAMINOPHEN 10 MG/ML IV SOLN
INTRAVENOUS | Status: AC
  Filled 2024-01-05: qty 100

## 2024-01-05 MED ORDER — ONDANSETRON HCL 4 MG/2ML IJ SOLN
INTRAMUSCULAR | Status: AC
  Filled 2024-01-05: qty 2

## 2024-01-05 MED ORDER — ACETAMINOPHEN 10 MG/ML IV SOLN
INTRAVENOUS | Status: DC | PRN
  Administered 2024-01-05: 1000 mg via INTRAVENOUS

## 2024-01-05 MED ORDER — CEFAZOLIN SODIUM-DEXTROSE 2-4 GM/100ML-% IV SOLN
INTRAVENOUS | Status: AC
  Filled 2024-01-05: qty 100

## 2024-01-05 MED ORDER — CHLORHEXIDINE GLUCONATE 0.12 % MT SOLN
OROMUCOSAL | Status: AC
  Filled 2024-01-05: qty 15

## 2024-01-05 MED ORDER — INSULIN ASPART 100 UNIT/ML IJ SOLN
INTRAMUSCULAR | Status: AC
  Filled 2024-01-05: qty 1

## 2024-01-05 MED ORDER — DEXAMETHASONE SODIUM PHOSPHATE 10 MG/ML IJ SOLN
INTRAMUSCULAR | Status: AC
Start: 2024-01-05 — End: 2024-01-05
  Filled 2024-01-05: qty 1

## 2024-01-05 MED ORDER — FENTANYL CITRATE (PF) 100 MCG/2ML IJ SOLN
INTRAMUSCULAR | Status: AC
  Filled 2024-01-05: qty 2

## 2024-01-05 MED ORDER — CEFAZOLIN SODIUM-DEXTROSE 2-4 GM/100ML-% IV SOLN
2.0000 g | INTRAVENOUS | Status: AC
  Administered 2024-01-05: 2 g via INTRAVENOUS

## 2024-01-05 MED ORDER — FENTANYL CITRATE (PF) 100 MCG/2ML IJ SOLN
INTRAMUSCULAR | Status: DC | PRN
  Administered 2024-01-05: 25 ug via INTRAVENOUS

## 2024-01-05 MED ORDER — ORAL CARE MOUTH RINSE: 15.0000 mL | Freq: Once | OROMUCOSAL | Status: AC

## 2024-01-05 MED ORDER — INSULIN ASPART 100 UNIT/ML IJ SOLN
7.0000 [IU] | Freq: Once | INTRAMUSCULAR | Status: AC
  Administered 2024-01-05: 7 [IU] via SUBCUTANEOUS

## 2024-01-05 MED ORDER — OXYCODONE HCL 5 MG PO TABS
5.0000 mg | ORAL_TABLET | Freq: Once | ORAL | Status: AC | PRN
  Administered 2024-01-05: 5 mg via ORAL

## 2024-01-05 MED ORDER — OXYCODONE HCL 5 MG/5ML PO SOLN: 5.0000 mg | Freq: Once | ORAL | Status: AC | PRN

## 2024-01-05 MED ORDER — OXYCODONE HCL 5 MG PO TABS
ORAL_TABLET | ORAL | Status: AC
  Filled 2024-01-05: qty 1

## 2024-01-05 MED ORDER — ONDANSETRON HCL 4 MG/2ML IJ SOLN
INTRAMUSCULAR | Status: DC | PRN
  Administered 2024-01-05: 4 mg via INTRAVENOUS

## 2024-01-05 MED ORDER — LIDOCAINE HCL (CARDIAC) PF 100 MG/5ML IV SOSY
PREFILLED_SYRINGE | INTRAVENOUS | Status: DC | PRN
  Administered 2024-01-05: 100 mg via INTRAVENOUS

## 2024-01-05 MED ORDER — PROPOFOL 10 MG/ML IV BOLUS
INTRAVENOUS | Status: AC
  Filled 2024-01-05: qty 20

## 2024-01-05 MED ORDER — TRAMADOL HCL 50 MG PO TABS: 25.0000 mg | ORAL_TABLET | Freq: Four times a day (QID) | ORAL | 0 refills | Status: AC | PRN

## 2024-01-05 MED ORDER — PROPOFOL 10 MG/ML IV BOLUS
INTRAVENOUS | Status: DC | PRN
  Administered 2024-01-05: 140 mg via INTRAVENOUS

## 2024-01-05 MED ORDER — CHLORHEXIDINE GLUCONATE 0.12 % MT SOLN
15.0000 mL | Freq: Once | OROMUCOSAL | Status: AC
  Administered 2024-01-05: 15 mL via OROMUCOSAL

## 2024-01-05 MED ORDER — LIDOCAINE HCL (PF) 2 % IJ SOLN
INTRAMUSCULAR | Status: AC
  Filled 2024-01-05: qty 5

## 2024-01-05 MED ORDER — ACETAMINOPHEN 10 MG/ML IV SOLN: 1000.0000 mg | Freq: Once | INTRAVENOUS | Status: DC | PRN

## 2024-01-05 MED ORDER — DROPERIDOL 2.5 MG/ML IJ SOLN: 0.6250 mg | Freq: Once | INTRAMUSCULAR | Status: DC | PRN

## 2024-01-05 MED ORDER — PROPOFOL 10 MG/ML IV BOLUS
INTRAVENOUS | Status: AC
Start: 2024-01-05 — End: 2024-01-05
  Filled 2024-01-05: qty 20

## 2024-01-05 MED ORDER — ROCURONIUM BROMIDE 10 MG/ML (PF) SYRINGE
PREFILLED_SYRINGE | INTRAVENOUS | Status: AC
  Filled 2024-01-05: qty 10

## 2024-01-05 MED ORDER — FENTANYL CITRATE (PF) 100 MCG/2ML IJ SOLN: 25.0000 ug | INTRAMUSCULAR | Status: DC | PRN

## 2024-01-05 MED ORDER — STERILE WATER FOR IRRIGATION IR SOLN
Status: DC | PRN
  Administered 2024-01-05: 500 mL
  Administered 2024-01-05: 3000 mL

## 2024-01-05 SURGICAL SUPPLY — 14 items
BAG DRAIN SIEMENS DORNER NS (MISCELLANEOUS) ×1 IMPLANT
BRUSH SCRUB EZ 4% CHG (MISCELLANEOUS) ×1 IMPLANT
GLOVE BIOGEL PI IND STRL 7.5 (GLOVE) ×1 IMPLANT
GOWN STRL REUS W/ TWL LRG LVL3 (GOWN DISPOSABLE) ×1 IMPLANT
GOWN STRL REUS W/ TWL XL LVL3 (GOWN DISPOSABLE) ×1 IMPLANT
KIT TURNOVER CYSTO (KITS) ×1 IMPLANT
PACK CYSTO AR (MISCELLANEOUS) ×1 IMPLANT
SET CYSTO W/LG BORE CLAMP LF (SET/KITS/TRAYS/PACK) ×1 IMPLANT
SOL .9 NS 3000ML IRR UROMATIC (IV SOLUTION) ×1 IMPLANT
SURGILUBE 2OZ TUBE FLIPTOP (MISCELLANEOUS) IMPLANT
SYSTEM UROLIFT 2 CART W/ HNDL (Male Continence) ×1 IMPLANT
SYSTEM UROLIFT 2 CARTRIDGE (Male Continence) IMPLANT
WATER STERILE IRR 1000ML POUR (IV SOLUTION) ×1 IMPLANT
WATER STERILE IRR 500ML POUR (IV SOLUTION) ×1 IMPLANT

## 2024-01-05 NOTE — Anesthesia Preprocedure Evaluation (Addendum)
 Anesthesia Evaluation  Patient identified by MRN, date of birth, ID band Patient awake    Reviewed: Allergy & Precautions, H&P , NPO status , Patient's Chart, lab work & pertinent test results  Airway Mallampati: III  TM Distance: >3 FB Neck ROM: full    Dental no notable dental hx.    Pulmonary sleep apnea , former smoker   Pulmonary exam normal        Cardiovascular hypertension, Normal cardiovascular exam  LVH   Neuro/Psych     Bipolar Disorder   negative neurological ROS  negative psych ROS   GI/Hepatic negative GI ROS, Neg liver ROS,,,  Endo/Other  diabetes, Type 2Hypothyroidism    Renal/GU      Musculoskeletal   Abdominal  (+) + obese  Peds  Hematology negative hematology ROS (+)   Anesthesia Other Findings Past Medical History: No date: Bipolar 1 disorder (HCC) No date: Depression No date: Dysrhythmia No date: Hyperlipidemia No date: Hypertension No date: Hypothyroidism No date: LVH (left ventricular hypertrophy) No date: Morbid obesity (HCC) No date: OSA on CPAP No date: Steatohepatitis No date: T2DM (type 2 diabetes mellitus) (HCC)  Past Surgical History: No date: APPENDECTOMY 01/04/2013, 08/04/2009: COLONOSCOPY 08/06/2018: COLONOSCOPY WITH PROPOFOL ; N/A     Comment:  Procedure: COLONOSCOPY WITH PROPOFOL ;  Surgeon:               Gaylyn Gladis PENNER, MD;  Location: [facility] ENDOSCOPY;                Service: Endoscopy;  Laterality: N/A; 05/11/2020: COLONOSCOPY WITH PROPOFOL ; N/A     Comment:  Procedure: COLONOSCOPY WITH PROPOFOL ;  Surgeon:               Maryruth Ole DASEN, MD;  Location: [facility] ENDOSCOPY;                Service: Endoscopy;  Laterality: N/A; 03/21/2002: ESOPHAGOGASTRODUODENOSCOPY No date: KNEE SURGERY; Right 1997: UVULOPALATOPHARYNGOPLASTY  BMI    Body Mass Index: 39.99 kg/m      Reproductive/Obstetrics negative OB ROS                              Anesthesia Physical Anesthesia Plan  ASA: 3  Anesthesia Plan: General   Post-op Pain Management:    Induction: Intravenous  PONV Risk Score and Plan: 2 and Ondansetron, Dexamethasone  and Midazolam   Airway Management Planned: LMA  Additional Equipment:   Intra-op Plan:   Post-operative Plan:   Informed Consent: I have reviewed the patients History and Physical, chart, labs and discussed the procedure including the risks, benefits and alternatives for the proposed anesthesia with the patient or authorized representative who has indicated his/her understanding and acceptance.     Dental Advisory Given  Plan Discussed with: CRNA and Surgeon  Anesthesia Plan Comments:         Anesthesia Quick Evaluation

## 2024-01-05 NOTE — Op Note (Signed)
 Date of procedure: 01/05/24  Preoperative diagnosis:  BPH with obstruction  Postoperative diagnosis:  Same  Procedure: UroLift(5 implants placed)  Surgeon: Redell Burnet, MD  Anesthesia: General  Complications: None  Intraoperative findings:  Small tight prostate with obstructive lateral lobes, moderate bladder trabeculations, ureteral orifices orthotopic bilaterally, no suspicious lesions Uncomplicated UroLift, excellent anterior channel  EBL: Minimal  Specimens: None  Drains: None  Indication: Carl Melton is a 68 y.o. patient with obstructive urinary symptoms, 18 g prostate, incomplete emptying with elevated PVRs who opted for UroLift.  Notably, has a number of other comorbidities including morbid obesity, diabetes, lithium , and high intake of bladder irritants that contribute to his urinary symptoms, and we discussed realistic expectations..  After reviewing the management options for treatment, they elected to proceed with the above surgical procedure(s). We have discussed the potential benefits and risks of the procedure, side effects of the proposed treatment, the likelihood of the patient achieving the goals of the procedure, and any potential problems that might occur during the procedure or recuperation. Informed consent has been obtained.  Description of procedure:  The patient was taken to the operating room and general anesthesia was induced. SCDs were placed for DVT prophylaxis. The patient was placed in the dorsal lithotomy position, prepped and draped in the usual sterile fashion, and preoperative antibiotics(Ancef) were administered. A preoperative time-out was performed.   The UroLift scope with visual obturator was inserted into the urethra and a normal-appearing urethra followed proximally to the bladder.  The prostate was short and tight with obstructive lateral lobes, and nodular protrusion into the fossa from the right lateral lobe.  Thorough cystoscopy  showed that the ureteral orifices were orthotopic bilaterally, and there were moderate trabeculations but no suspicious lesions.   I started by placing a UroLift implant at the left anterior lateral lobe 1 cm distal to the bladder neck.  An identical implant was placed on the right side.  I then reinserted the scope with the visual obturator and this appeared to open the anterior channel, but there was some residual tissue just proximal to the verumontanum.  The UroLift device was used to place another implant on the left side just lateral and anterior to the verumontanum, an identical implant placed on the right side.  I again looked with the visual obturator, and there was persistent small nodule of obstructive appearing tissue right anterior channel.  A final fifth UroLift implant was used to tack this to the side.   I reinserted the scope with the visual obturator and there was an excellent anterior channel and the prostatic fossa was wide open.  There is no evidence of any bleeding.  The bladder was left partially filled, and he will need to void prior to discharge.  Disposition: Stable to PACU  Plan: Must void prior to discharge RTC 4 weeks IPSS and PVR  Redell Burnet, MD

## 2024-01-05 NOTE — Anesthesia Postprocedure Evaluation (Signed)
 Anesthesia Post Note  Patient: Carl Melton  Procedure(s) Performed: CYSTOSCOPY WITH INSERTION OF UROLIFT (Prostate)  Patient location during evaluation: PACU Anesthesia Type: General Level of consciousness: awake and alert Pain management: pain level controlled Vital Signs Assessment: post-procedure vital signs reviewed and stable Respiratory status: spontaneous breathing, nonlabored ventilation and respiratory function stable Cardiovascular status: blood pressure returned to baseline and stable Postop Assessment: no apparent nausea or vomiting Anesthetic complications: no   No notable events documented.   Last Vitals:  Vitals:   01/05/24 0815 01/05/24 0837  BP: 138/79 (!) 158/89  Pulse: 96 77  Resp: 17 17  Temp:  (!) 36.1 C  SpO2: 95% 96%    Last Pain:  Vitals:   01/05/24 0837  TempSrc: Temporal  PainSc: 3                  Camellia Merilee Louder

## 2024-01-05 NOTE — H&P (Signed)
   01/05/24 7:09 AM   Carl Melton 04-Feb-1956 [id number]  CC: BPH and urinary symptoms  HPI: Comorbid 69 year old male with morbid obesity, on lithium , poorly controlled diabetes with glucosuria with mixed urinary symptoms including urgency, frequency, nocturia 5x despite compliance with CPAP, and incomplete emptying with most recent PVR .  Reports no improvement on Myrbetriq  or Flomax  previously.  Prostate measured 18 g and cystoscopy showed tight and obstructive prostate, he opted for UroLift.   PMH: Past Medical History:  Diagnosis Date   Bipolar 1 disorder (HCC)    Depression    Dysrhythmia    Hyperlipidemia    Hypertension    Hypothyroidism    LVH (left ventricular hypertrophy)    Morbid obesity (HCC)    OSA on CPAP    Steatohepatitis    T2DM (type 2 diabetes mellitus) (HCC)     Surgical History: Past Surgical History:  Procedure Laterality Date   APPENDECTOMY     COLONOSCOPY  01/04/2013, 08/04/2009   COLONOSCOPY WITH PROPOFOL  N/A 08/06/2018   Procedure: COLONOSCOPY WITH PROPOFOL ;  Surgeon: Gaylyn Gladis PENNER, MD;  Location: [facility] ENDOSCOPY;  Service: Endoscopy;  Laterality: N/A;   COLONOSCOPY WITH PROPOFOL  N/A 05/11/2020   Procedure: COLONOSCOPY WITH PROPOFOL ;  Surgeon: Maryruth Ole DASEN, MD;  Location: [facility] ENDOSCOPY;  Service: Endoscopy;  Laterality: N/A;   ESOPHAGOGASTRODUODENOSCOPY  03/21/2002   KNEE SURGERY Right    UVULOPALATOPHARYNGOPLASTY  1997     Family History: History reviewed. No pertinent family history.  Social History:  reports that he quit smoking about 44 years ago. His smoking use included cigarettes. He started smoking about 50 years ago. He has a 12 pack-year smoking history. He has been exposed to tobacco smoke. He has never used smokeless tobacco. He reports that he does not currently use alcohol after a past usage of about 4.0 standard drinks of alcohol per week. He reports that he does not use drugs.  Physical Exam: BP 125/62    Pulse 87   Temp 97.6 F (36.4 C) (Tympanic)   Resp 18   Ht 5' 8 (1.727 m)   Wt 119.3 kg   SpO2 97%   BMI 39.99 kg/m    Constitutional:  Alert and oriented, No acute distress. Cardiovascular: Regular rate and rhythm Respiratory: Clear to auscultation bilaterally GI: Abdomen is soft, nontender, nondistended, no abdominal masses   Laboratory Data: Urine culture 6/17 no growth   Assessment & Plan:   Extremely comorbid 68 year old male with obstructive urinary symptoms, failed OAB medications, and elevated PVRs in clinic, opted for UroLift with understanding that with his other comorbidities including morbid obesity, poorly controlled diabetes, high intake of bladder irritants, and lithium  may have persistent urinary symptoms requiring additional treatments in the future.  We discussed very low risk of bleeding, infection, incontinence.  UroLift today   Redell Burnet, MD 01/05/2024  [facility] [address], [address] [place], [place] [address] [phone]

## 2024-01-05 NOTE — Anesthesia Procedure Notes (Signed)
 Procedure Name: LMA Insertion Date/Time: 01/05/2024 7:39 AM  Performed by: Jackye Spanner, CRNAPre-anesthesia Checklist: Patient identified, Patient being monitored, Timeout performed, Emergency Drugs available and Suction available Patient Re-evaluated:Patient Re-evaluated prior to induction Oxygen  Delivery Method: Circle system utilized Preoxygenation: Pre-oxygenation with 100% oxygen  Induction Type: IV induction Ventilation: Mask ventilation without difficulty LMA: LMA inserted LMA Size: 5.0 Tube type: Oral Number of attempts: 1 Placement Confirmation: positive ETCO2 and breath sounds checked- equal and bilateral Tube secured with: Tape Dental Injury: Teeth and Oropharynx as per pre-operative assessment  Comments: Smooth atraumatic LMA placement, no complications noted.

## 2024-01-05 NOTE — Transfer of Care (Signed)
 Immediate Anesthesia Transfer of Care Note  Patient: Carl Melton  Procedure(s) Performed: CYSTOSCOPY WITH INSERTION OF UROLIFT (Prostate)  Patient Location: PACU  Anesthesia Type:General  Level of Consciousness: awake, alert , and drowsy  Airway & Oxygen  Therapy: Patient Spontanous Breathing and Patient connected to face mask oxygen   Post-op Assessment: Report given to RN and Post -op Vital signs reviewed and stable  Post vital signs: Reviewed and stable  Last Vitals:  Vitals Value Taken Time  BP 122/83 01/05/24 07:56  Temp 36.1 0756  Pulse 77 01/05/24 07:57  Resp 20 0756  SpO2 99 % 01/05/24 07:57  Vitals shown include unfiled device data.  Last Pain:  Vitals:   01/05/24 0615  TempSrc: Tympanic  PainSc: 6          Complications: No notable events documented.

## 2024-01-08 ENCOUNTER — Encounter: Payer: Self-pay | Admitting: Urology

## 2024-01-16 DIAGNOSIS — H2513 Age-related nuclear cataract, bilateral: Secondary | ICD-10-CM | POA: Diagnosis not present

## 2024-01-16 DIAGNOSIS — E119 Type 2 diabetes mellitus without complications: Secondary | ICD-10-CM | POA: Diagnosis not present

## 2024-02-06 DIAGNOSIS — M47816 Spondylosis without myelopathy or radiculopathy, lumbar region: Secondary | ICD-10-CM | POA: Diagnosis not present

## 2024-02-06 DIAGNOSIS — M25562 Pain in left knee: Secondary | ICD-10-CM | POA: Diagnosis not present

## 2024-02-06 DIAGNOSIS — M1711 Unilateral primary osteoarthritis, right knee: Secondary | ICD-10-CM | POA: Diagnosis not present

## 2024-02-06 DIAGNOSIS — M25552 Pain in left hip: Secondary | ICD-10-CM | POA: Diagnosis not present

## 2024-02-14 DIAGNOSIS — N183 Chronic kidney disease, stage 3 unspecified: Secondary | ICD-10-CM | POA: Diagnosis not present

## 2024-02-14 DIAGNOSIS — E1122 Type 2 diabetes mellitus with diabetic chronic kidney disease: Secondary | ICD-10-CM | POA: Diagnosis not present

## 2024-02-14 DIAGNOSIS — E039 Hypothyroidism, unspecified: Secondary | ICD-10-CM | POA: Diagnosis not present

## 2024-02-15 ENCOUNTER — Ambulatory Visit: Admitting: Urology

## 2024-02-21 ENCOUNTER — Ambulatory Visit: Admitting: Urology

## 2024-02-21 VITALS — BP 144/85 | HR 72 | Wt 270.0 lb

## 2024-02-21 DIAGNOSIS — N1831 Chronic kidney disease, stage 3a: Secondary | ICD-10-CM | POA: Diagnosis not present

## 2024-02-21 DIAGNOSIS — N138 Other obstructive and reflux uropathy: Secondary | ICD-10-CM

## 2024-02-21 DIAGNOSIS — Z6841 Body Mass Index (BMI) 40.0 and over, adult: Secondary | ICD-10-CM | POA: Diagnosis not present

## 2024-02-21 DIAGNOSIS — R351 Nocturia: Secondary | ICD-10-CM

## 2024-02-21 DIAGNOSIS — N401 Enlarged prostate with lower urinary tract symptoms: Secondary | ICD-10-CM

## 2024-02-21 DIAGNOSIS — R399 Unspecified symptoms and signs involving the genitourinary system: Secondary | ICD-10-CM

## 2024-02-21 DIAGNOSIS — N3281 Overactive bladder: Secondary | ICD-10-CM

## 2024-02-21 DIAGNOSIS — E1169 Type 2 diabetes mellitus with other specified complication: Secondary | ICD-10-CM | POA: Diagnosis not present

## 2024-02-21 DIAGNOSIS — Z794 Long term (current) use of insulin: Secondary | ICD-10-CM | POA: Diagnosis not present

## 2024-02-21 DIAGNOSIS — E1165 Type 2 diabetes mellitus with hyperglycemia: Secondary | ICD-10-CM | POA: Diagnosis not present

## 2024-02-21 DIAGNOSIS — E1122 Type 2 diabetes mellitus with diabetic chronic kidney disease: Secondary | ICD-10-CM | POA: Diagnosis not present

## 2024-02-21 DIAGNOSIS — I152 Hypertension secondary to endocrine disorders: Secondary | ICD-10-CM | POA: Diagnosis not present

## 2024-02-21 DIAGNOSIS — N183 Chronic kidney disease, stage 3 unspecified: Secondary | ICD-10-CM | POA: Diagnosis not present

## 2024-02-21 DIAGNOSIS — E785 Hyperlipidemia, unspecified: Secondary | ICD-10-CM | POA: Diagnosis not present

## 2024-02-21 DIAGNOSIS — E1159 Type 2 diabetes mellitus with other circulatory complications: Secondary | ICD-10-CM | POA: Diagnosis not present

## 2024-02-21 DIAGNOSIS — G4733 Obstructive sleep apnea (adult) (pediatric): Secondary | ICD-10-CM | POA: Diagnosis not present

## 2024-02-21 DIAGNOSIS — E039 Hypothyroidism, unspecified: Secondary | ICD-10-CM | POA: Diagnosis not present

## 2024-02-21 LAB — BLADDER SCAN AMB NON-IMAGING

## 2024-02-21 MED ORDER — AMILORIDE HCL 5 MG PO TABS: 5.0000 mg | ORAL_TABLET | Freq: Every day | ORAL | 11 refills | Status: AC

## 2024-02-21 NOTE — Progress Notes (Signed)
   02/21/2024 2:52 PM   Kate Sweetman Marmol 11-06-1955 [id number]  Reason for visit: Follow up urinary symptoms  HPI: Comorbid 68 year old male with morbid obesity and BMI of 41, diabetes, lithium  use, high intake of bladder irritants who presented with worsening nocturia 5-6 times per night and incomplete bladder emptying with PVRs greater than 300.  Prostate was small and measured 18 g and he opted for UroLift, underwent uncomplicated UroLift on 01/05/2024 with excellent anterior channel.  He denies any major change in his urinary symptoms since that time.  PVR today is improved to .  He continues to be minimally bothered during the day, and primary issue is nocturia 5-6 times at night.  We discussed avoiding bladder irritants, he is bothered enough to trial amiloride  to offset the effects of lithium .  Risk and benefits discussed extensively including hyperkalemia.  Trial of amiloride  5mg  daily, check potassium at 1 week in 1 month If no improvement could consider trial of desmopressin RTC 1 month PVR, BMP prior  Redell JAYSON Burnet, MD  [facility] [address] [place], [place] [address] [phone]

## 2024-02-21 NOTE — Patient Instructions (Signed)

## 2024-02-28 ENCOUNTER — Other Ambulatory Visit

## 2024-03-10 ENCOUNTER — Other Ambulatory Visit: Payer: Self-pay | Admitting: Psychiatry

## 2024-03-10 DIAGNOSIS — F319 Bipolar disorder, unspecified: Secondary | ICD-10-CM

## 2024-03-15 DIAGNOSIS — G8929 Other chronic pain: Secondary | ICD-10-CM | POA: Diagnosis not present

## 2024-03-15 DIAGNOSIS — M5442 Lumbago with sciatica, left side: Secondary | ICD-10-CM | POA: Diagnosis not present

## 2024-03-26 DIAGNOSIS — M5442 Lumbago with sciatica, left side: Secondary | ICD-10-CM | POA: Diagnosis not present

## 2024-03-26 DIAGNOSIS — G8929 Other chronic pain: Secondary | ICD-10-CM | POA: Diagnosis not present

## 2024-04-01 ENCOUNTER — Other Ambulatory Visit

## 2024-04-02 DIAGNOSIS — G8929 Other chronic pain: Secondary | ICD-10-CM | POA: Diagnosis not present

## 2024-04-02 DIAGNOSIS — M5442 Lumbago with sciatica, left side: Secondary | ICD-10-CM | POA: Diagnosis not present

## 2024-04-03 ENCOUNTER — Ambulatory Visit: Admitting: Urology

## 2024-04-08 DIAGNOSIS — E1159 Type 2 diabetes mellitus with other circulatory complications: Secondary | ICD-10-CM | POA: Diagnosis not present

## 2024-04-08 DIAGNOSIS — Z794 Long term (current) use of insulin: Secondary | ICD-10-CM | POA: Diagnosis not present

## 2024-04-08 DIAGNOSIS — E1165 Type 2 diabetes mellitus with hyperglycemia: Secondary | ICD-10-CM | POA: Diagnosis not present

## 2024-04-08 DIAGNOSIS — E1169 Type 2 diabetes mellitus with other specified complication: Secondary | ICD-10-CM | POA: Diagnosis not present

## 2024-04-08 DIAGNOSIS — E785 Hyperlipidemia, unspecified: Secondary | ICD-10-CM | POA: Diagnosis not present

## 2024-04-08 DIAGNOSIS — N1831 Chronic kidney disease, stage 3a: Secondary | ICD-10-CM | POA: Diagnosis not present

## 2024-04-08 DIAGNOSIS — I152 Hypertension secondary to endocrine disorders: Secondary | ICD-10-CM | POA: Diagnosis not present

## 2024-04-08 DIAGNOSIS — E1122 Type 2 diabetes mellitus with diabetic chronic kidney disease: Secondary | ICD-10-CM | POA: Diagnosis not present

## 2024-04-15 DIAGNOSIS — G8929 Other chronic pain: Secondary | ICD-10-CM | POA: Diagnosis not present

## 2024-04-15 DIAGNOSIS — M5442 Lumbago with sciatica, left side: Secondary | ICD-10-CM | POA: Diagnosis not present

## 2024-04-16 ENCOUNTER — Ambulatory Visit (INDEPENDENT_AMBULATORY_CARE_PROVIDER_SITE_OTHER): Admitting: Psychiatry

## 2024-04-16 ENCOUNTER — Encounter: Payer: Self-pay | Admitting: Psychiatry

## 2024-04-16 DIAGNOSIS — F5105 Insomnia due to other mental disorder: Secondary | ICD-10-CM

## 2024-04-16 DIAGNOSIS — R350 Frequency of micturition: Secondary | ICD-10-CM

## 2024-04-16 DIAGNOSIS — G4734 Idiopathic sleep related nonobstructive alveolar hypoventilation: Secondary | ICD-10-CM | POA: Diagnosis not present

## 2024-04-16 DIAGNOSIS — G4733 Obstructive sleep apnea (adult) (pediatric): Secondary | ICD-10-CM | POA: Diagnosis not present

## 2024-04-16 DIAGNOSIS — F319 Bipolar disorder, unspecified: Secondary | ICD-10-CM

## 2024-04-16 NOTE — Progress Notes (Signed)
 Carl Melton [id number] August 18, 1955 68 y.o.  Subjective:   Patient ID:  Carl Melton is a 68 y.o. (DOB March 03, 1956) male.  Chief Complaint:  No chief complaint on file.   HPI [name] presents to the office today for follow-up of bipolar disorder and sleep.    seen December 2020.  No meds were changed.  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    01/06/2020 appointment with the following noted: Continues lithium  450 mg 3 and 1/2 tablets daily, lorazepam  1.5 mg HS, Risperdal  1 mg HS. Vaccinated. OK overall.  Doing job well with good reports but getting harder to manage multiple pieces.  Want to work 2 more years after this one.  Has good assistants.  Has good rapport with coworkers.  Mood been OK.  Only thing he notices is tremor with fine motor things at times.  OK typing.  Done well with work.  Has been able to keep going forward.  Would like to retire sooner than 66 but not sure.  Would stay busy with retirement.   Sleep is pretty good and naps after work.  OK during the day.  Has to get up anyway bc nocturia and now having trouble going back too sleep for the last 3-4 mos. 2 nights out of the week. Most of the time feeling good.  No significant mood swings No unusual fear.  Sleep is better witn increase in lorazepam  without awakening.  Active.  Patient reports stable mood and denies depressed.  Patient denies difficulty with sleep initiation. Denies appetite disturbance.  Patient reports that energy and motivation have been good.  Patient denies any difficulty with concentration.  Patient denies any suicidal ideation.  Physical health stable. Plan; check lithium  level. No med changes  07/07/2020 appointment with the following noted:  Still undecided about retirement but probably a couple of years more.  Job is going OK. Occ a little agitated but not manic and no problems with it.  Dealing with personnel sometimes can be  stressful.  More of it in the last year, bc of a couple of key people leaving but it's better now. No mood swings.   Not sleeping great.  DM is a problem causing nocturia 4-5 times nightly. No sig SE lithium . Patient reports stable mood and denies depressed or irritable moods.  Patient denies any recent difficulty with anxiety. Denies appetite disturbance.  Patient reports that energy and motivation have been good.  Patient denies any difficulty with concentration.  Patient denies any suicidal ideation. Plan: for nocutria switch lithium  to 900 mg AM and  1 and 1/2 of 450 mg tablets at night.  12/30/2020 appointment with the following noted: Fine with meds but some trouble sleeping but thinks it's stress.  [place] and [place] up once to urinate.  Home up 2-3 times nightly. Wonders if he has beginning PD with tremor and slower and stiffer. Worries too much.  Patient reports stable mood and denies depressed or irritable moods.  Patient denies any recent difficulty with anxiety.  Denies appetite disturbance.  Patient reports that energy and motivation have been good.  Patient denies any difficulty with concentration.  Patient denies any suicidal ideation. Plan:  No change indicated except for nocutria switch lithium  to 900 mg AM and  1 and 1/2 of 450 mg tablets at night. Disc diagnosing PD vs EPS from risperidone .  Disc risk of lowering  the dose.  Reduce risperidone  to 1/2 of 1 mg HS to see if Parkinsonism is better  03/29/21 appt noted: For 2 weeks on lower risperidone  OK and then depressed.  Increased to 1 mg HS and in 2 days he felt better. Tremor, slowness did not improve when reduced risperidone . Saw PCP thinks it's not PD Postional vertigo with head movment.  Facial paresthesias. Nocturia unchanged.  Asks about meds for it. No mood swings.   Patient reports stable mood and denies depressed or irritable moods.  Patient denies any recent difficulty with anxiety.  Patient denies  difficulty with sleep initiation or maintenance. Denies appetite disturbance.  Patient reports that energy and motivation have been good.  Patient denies any difficulty with concentration.  Patient denies any suicidal ideation. Tremor unchanged. Plan: Check lithium  level Check B12 for paresthesias. Continue lithium  900 mg every morning and 1-1/2 of the 450 mg tablets at night Continue risperidone  1 mg nightly  08/31/21 appt .   Pain in neck interfering.  On 3rd doc now.  Pending MRI.  Aggrivatiing. PT didn't help nor chiropracter.  Seeing ortho now. Sleeping problem.  To bed 830 and awake 0200 and back to bed after 30 min. Mood is ok.  No fear nor anxiety. Some daytime drowsiness.  Checking into new CPAP 12/17/21 will retire. Plaan: DC lorazepam   Alprazolam  0.5 mg HS Reduced risperidone  to 1/2 of 1 mg HS to see if Parkinsonism is better failed but more depressed. Therefore continue risperidone  1mg  HS  12/13/21 TC:  getting new bipap machine and on 2 liters O2 at night and appt with pulm Dr. Saundra office 01/04/22 and 02/19/22  06/22/22 appt noted: FU with pulm 05/24/22 Dr. Neysa with further rec changes including O2 at night and get a new Bipap machine. Not using O2 at night bc noise of the machine.  [name] fusses at him for not using.   Psych meds: lithium  450 mg 1 and 1/2 tablets HS and 2 AM, risperidone  1 mg HS, lorazepam  0.5  mg 3 tabs HS Effort to try different sleep meds Xanax  and clonazepam  failed.  Lorazepam  lasts about 3 hours.   6-7 hours of broken sleep and thinks it is the bladder need to urinate. Mood has been ok overall except about sleep. More easily anxious than in the past. Plan: Repeat lithium  level.  He took ith this am about 545 AM and will check level about noon today. Then consider reduction.    06/22/22 lab: lihtium level is too high (1.8 but it was not a trough, but about 6 hours after dose).  Tell him to reduce lithium  to 1 in the Am and 2 at night of the 450 mg  tablets..  09/21/2022 appointment noted: Some interrupted sleep cycles but ambien  and Lunesta  didn't make much difference. Mood is pretty steady without mania.  Dorthea is not complaining.   Sees Dr. Neysa re: OSA with O2 at night.  More compliant with CPAP.  Doesn't bother him as much. Continues meds.  No changes with reduction of lithium  to 450 AM and 900 pM unless more focused.   Lost wt from 315 to 260#. Plan: continue lithium  to 450 mg AM and 2 of 450 mg tablets at night.   Repeat lithium  level before next app   03/27/23 appt noted: Dr. Lillie Rx clonazepam  0.5 mg 1-2 mg HS for sleep. Psych med: lithium  ER 450 BID, risperidone  1 mg HS. Mood has been good. Still some trouble with sleep. Helps with  mother 61 yo.  Busier than he wants to be.   No problems with meds.    No SE with less lithium .   Off O2 at night bc it was irritating and noisy.  Saw Dr. Neysa last week.  Who said ok to stop it.   Using BIPAP.  12/14/23 appt noted: Psych med: lithium  ER 450 BID, risperidone  1 mg HS.clonazepam  0.5 mg  HS for sleep. Frequent nocturia.  Some prostatic hyperplasia and pending procedure.   Clonazepam  not working anymore.  To bed 9 and awake 1 and trouble going back to sleep.  Not as much urination frequency in the day but more than I should.  No mood swings or paranoia.   M living alone at 90 and he's running around caring for her.  Her mind is pretty good.   No other med concerns except sleep.    10/7/25appt noted:  Med as above except clonazepam  0.5 failed,  taking otc Patient reports stable mood and denies depressed or irritable moods.  Patient denies any recent difficulty with anxiety.  Patient denies difficulty with sleep initiation or maintenance. Denies appetite disturbance.  Patient reports that energy and motivation have been good.  Patient denies any difficulty with concentration.  Patient denies any suicidal ideation. Stress care for elderly M.  Pretty normal. Spinal stenosis.  And  knee pain.   No known SE Hadn't gotten lithium  level yet.  Past Psychiatric Medication Trials: Risperidone  1,  Geodon sedation, haloperidol, Prolixin, Trileptal, gabapentin, sertraline, Wellbutrin,  lithium , amiloride ,  lorazepam , zolpidem  and Lunesta  NR Xanax  and clonazepam  HS ineffective  He has been under our care since December 1994.  He has had psychiatric hospitalization once.  He has been stable on lithium  and risperidone  since about 2007  Review of Systems:  Review of Systems  Constitutional:  Positive for fatigue.  HENT:  Positive for hearing loss and tinnitus.   Genitourinary:  Positive for frequency.  Musculoskeletal:  Positive for arthralgias, gait problem and neck pain.  Neurological:  Negative for dizziness, tremors and weakness.       Balance issues Facial paresthesia  Psychiatric/Behavioral:  Positive for sleep disturbance. Negative for agitation, behavioral problems, confusion, decreased concentration, dysphoric mood, hallucinations, self-injury and suicidal ideas. The patient is not nervous/anxious and is not hyperactive.     Medications: I have reviewed the patient's current medications.  Current Outpatient Medications  Medication Sig Dispense Refill   lithium  carbonate (ESKALITH ) 450 MG ER tablet TAKE 2 TABLETS (900 MG TOTAL) BY MOUTH AT BEDTIME. 120 tablet 0   aMILoride  (MIDAMOR ) 5 MG tablet Take 1 tablet (5 mg total) by mouth daily. 30 tablet 11   atorvastatin (LIPITOR) 80 MG tablet Take 1 tablet by mouth at bedtime.     clonazePAM  (KLONOPIN ) 0.5 MG tablet Take 1.5 tablets (0.75 mg total) by mouth at bedtime. 45 tablet 2   Continuous Glucose Sensor (FREESTYLE LIBRE 3 PLUS SENSOR) MISC SMARTSIG:1 Each Once a Week     felodipine (PLENDIL) 5 MG 24 hr tablet 5 mg at bedtime.  4   JARDIANCE 25 MG TABS tablet Take 25 mg by mouth daily.     levothyroxine (SYNTHROID) 100 MCG tablet Take 100 mcg by mouth at bedtime.     lisinopril (PRINIVIL,ZESTRIL) 20 MG tablet Take  20 mg by mouth at bedtime.  5   metFORMIN (GLUCOPHAGE-XR) 500 MG 24 hr tablet Take 1,500 mg by mouth daily.  5   methocarbamol (ROBAXIN) 500 MG tablet take 1 tablet at  bed time as needed     risperiDONE  (RISPERDAL ) 1 MG tablet Take 1 tablet (1 mg total) by mouth at bedtime. 90 tablet 1   Semaglutide (OZEMPIC, 1 MG/DOSE, [place]) Inject 1 mg into the skin once a week. Monday's     tamsulosin  (FLOMAX ) 0.4 MG CAPS capsule Take by mouth.     XIGDUO XR 04-999 MG TB24 Take 1 tablet by mouth every morning. (Patient not taking: Reported on 02/21/2024)     Current Facility-Administered Medications  Medication Dose Route Frequency Provider Last Rate Last Admin   acetaminophen  (TYLENOL ) tablet 500 mg  500 mg Oral Once Ratcliffe, Heather R, PA-C        Medication Side Effects: None  Allergies:  Allergies  Allergen Reactions   Erythromycin Ethylsuccinate Rash   Simvastatin Other (See Comments) and Rash    Past Medical History:  Diagnosis Date   Bipolar 1 disorder (HCC)    Depression    Dysrhythmia    Hyperlipidemia    Hypertension    Hypothyroidism    LVH (left ventricular hypertrophy)    Morbid obesity (HCC)    OSA on CPAP    Steatohepatitis    T2DM (type 2 diabetes mellitus) (HCC)     No family history on file.  Social History   Socioeconomic History   Marital status: Married    Spouse name: Not on file   Number of children: Not on file   Years of education: Not on file   Highest education level: Not on file  Occupational History   Not on file  Tobacco Use   Smoking status: Former    Current packs/day: 0.00    Average packs/day: 2.0 packs/day for 6.0 years (12.0 ttl pk-yrs)    Types: Cigarettes    Start date: 12/11/1973    Quit date: 12/12/1979    Years since quitting: 44.3    Passive exposure: Past   Smokeless tobacco: Never  Vaping Use   Vaping status: Never Used  Substance and Sexual Activity   Alcohol use: Not Currently    Alcohol/week: 4.0 standard drinks of alcohol     Types: 4 Cans of beer per week   Drug use: Never   Sexual activity: Not on file  Other Topics Concern   Not on file  Social History Narrative   Not on file   Social Drivers of Health   Financial Resource Strain: Low Risk  (02/21/2024)   Received from [facility]   Overall Financial Resource Strain (CARDIA)    Difficulty of Paying Living Expenses: Not hard at all  Food Insecurity: No Food Insecurity (02/21/2024)   Received from [facility]   Hunger Vital Sign    Within the past 12 months, you worried that your food would run out before you got the money to buy more.: Never true    Within the past 12 months, the food you bought just didn't last and you didn't have money to get more.: Never true  Transportation Needs: No Transportation Needs (02/21/2024)   Received from [facility] - Transportation    In the past 12 months, has lack of transportation kept you from medical appointments or from getting medications?: No    Lack of Transportation (Non-Medical): No  Physical Activity: Not on file  Stress: Not on file  Social Connections: Not on file  Intimate Partner Violence: Not on file    Past Medical History, Surgical history, Social history, and  Family history were reviewed and updated as appropriate.   Please see review of systems for further details on the patient's review from today.   Objective:   Physical Exam:  There were no vitals taken for this visit.  Physical Exam Constitutional:      General: He is not in acute distress.    Appearance: He is obese.  Musculoskeletal:        General: No deformity.  Neurological:     Mental Status: He is alert and oriented to person, place, and time.     Motor: Abnormal muscle tone present.     Coordination: Coordination normal.     Gait: Gait abnormal.     Comments: A little smaller step length in gait. Mild-mod increase motor tone No sig tremor noticed   Psychiatric:        Attention and Perception: Attention and perception normal.        Mood and Affect: Mood is not anxious or depressed. Affect is not labile, blunt or angry.        Speech: Speech normal. Speech is not rapid and pressured.        Behavior: Behavior normal.        Thought Content: Thought content normal. Thought content is not paranoid or delusional. Thought content does not include homicidal or suicidal ideation. Thought content does not include suicidal plan.        Cognition and Memory: Cognition normal. Cognition is not impaired. Memory is not impaired.        Judgment: Judgment normal.     Comments: Insight intact. No auditory or visual hallucinations. No delusions.  no drooling and alert     Lab Review:     Component Value Date/Time   NA 138 01/01/2024 0955   NA 138 03/31/2022 1140   K 4.6 01/01/2024 0955   CL 108 01/01/2024 0955   CO2 20 (L) 01/01/2024 0955   GLUCOSE 308 (H) 01/01/2024 0955   BUN 26 (H) 01/01/2024 0955   BUN 16 03/31/2022 1140   CREATININE 1.14 01/01/2024 0955   CALCIUM 9.2 01/01/2024 0955   PROT 7.7 01/01/2024 0955   PROT 7.4 03/31/2022 1140   ALBUMIN 4.4 01/01/2024 0955   ALBUMIN 5.0 (H) 03/31/2022 1140   AST 16 01/01/2024 0955   ALT 16 01/01/2024 0955   ALKPHOS 46 01/01/2024 0955   BILITOT 0.8 01/01/2024 0955   BILITOT 0.3 03/31/2022 1140   GFRNONAA >60 01/01/2024 0955   GFRAA 84 01/06/2020 0724       Component Value Date/Time   WBC 6.8 01/01/2024 0955   RBC 4.57 01/01/2024 0955   HGB 13.8 01/01/2024 0955   HCT 41.7 01/01/2024 0955   PLT 224 01/01/2024 0955   MCV 91.2 01/01/2024 0955   MCH 30.2 01/01/2024 0955   MCHC 33.1 01/01/2024 0955   RDW 13.5 01/01/2024 0955    Lithium  Lvl  Date Value Ref Range Status  09/14/2023 1.1 0.5 - 1.2 mmol/L Final    Comment:    A concentration of 0.5-0.8 mmol/L is advised for long-term use; concentrations of up to 1.2 mmol/L may be necessary during acute treatment.                                   Detection Limit = 0.1                           <  0.1 indicates None Detected              Component Ref Range & Units 2 mo ago (01/23/23) 6 mo ago (09/27/22) 9 mo ago (06/22/22) 1 yr ago (10/01/21) 1 yr ago (03/30/21) 3 yr ago (01/06/20) 3 yr ago (07/18/19)  Lithium  Lvl 0.5 - 1.2 mmol/L 1.0 1.4 High Panic  CM 1.8 High Panic  CM 1.2 CM 1.4 High Panic  CM 1.1 CM 1.2 R, CM    09/14/23 lithium  1.0 on 900 mg. 08/17/23 normal BMP except gluocose 159, Cr 1.2  01/23/23 Lithium  level 1.0 on 900 mg daily.  10/01/21 Lithium  1.2 on 450 mg tablet 1 and 1/2 at night  No results found for: PHENYTOIN, PHENOBARB, VALPROATE, CBMZ   BMP was January 31, 2018 and was within normal limits except for glucose 227, TSH was 4.64, and lithium  level was 0.8 which is stable.  .res Assessment: Plan:    There are no diagnoses linked to this encounter.  30 min face to face time with patient We discussed the following: Long term benefit lithium  and risperidone  and is stable with past history of psychotic features if not on antipsychotic.  We discussed the short-term risks associated with benzodiazepines including sedation and increased fall risk among others.  Discussed long-term side effect risk including dependence, potential withdrawal symptoms, and the potential eventual dose-related risk of dementia.  But recent studies from 2020 dispute this association between benzodiazepines and dementia risk. Newer studies in 2020 do not support an association with dementia.  Insomnia with broken sleep and failed alternatives.  Likely at part related to poorly treated OSA. Increase clonazepam  0.75 mg HS  continue lithium  to 450 mg BID with level 1.0 better.   Repeat lithium  level  now  Counseled patient regarding potential benefits, risks, and side effects of lithium  to include potential risk of lithium  affecting thyroid  and renal function.  Discussed need for periodic lab monitoring to determine drug  level and to assess for potential adverse effects.  Counseled patient regarding signs and symptoms of lithium  toxicity and advised that they notify office immediately or seek urgent medical attention if experiencing these signs and symptoms.  Patient advised to contact office with any questions or concerns.  Less movement problems.  Reduced risperidone  to 1/2 of 1 mg HS to see if Parkinsonism is better failed but more depressed.   Therefore continue risperidone  1mg  HS  Continue Lithium  Check level.  Extensive disc of severe sleep apnea importance of treating it for brain health.  Option amiloride  DT U frequency.  He wants to wait and see Uro first.  FU 4 mos  Lorene Macintosh, MD, DFAPA    Please see After Visit Summary for patient specific instructions.  Future Appointments  Date Time Provider Department Center  07/19/2024 11:00 AM Neysa Reggy BIRCH, MD [facility] [address]  10/02/2024 11:00 AM Francisca Redell BROCKS, MD [facility] None     No orders of the defined types were placed in this encounter.      -------------------------------

## 2024-04-17 DIAGNOSIS — F319 Bipolar disorder, unspecified: Secondary | ICD-10-CM | POA: Diagnosis not present

## 2024-04-18 LAB — LITHIUM LEVEL: Lithium Lvl: 1 mmol/L (ref 0.5–1.2)

## 2024-04-23 DIAGNOSIS — G8929 Other chronic pain: Secondary | ICD-10-CM | POA: Diagnosis not present

## 2024-04-23 DIAGNOSIS — M5442 Lumbago with sciatica, left side: Secondary | ICD-10-CM | POA: Diagnosis not present

## 2024-04-25 ENCOUNTER — Other Ambulatory Visit: Payer: Self-pay | Admitting: Orthopedic Surgery

## 2024-04-25 DIAGNOSIS — M47816 Spondylosis without myelopathy or radiculopathy, lumbar region: Secondary | ICD-10-CM

## 2024-04-29 DIAGNOSIS — D2371 Other benign neoplasm of skin of right lower limb, including hip: Secondary | ICD-10-CM | POA: Diagnosis not present

## 2024-04-29 DIAGNOSIS — D225 Melanocytic nevi of trunk: Secondary | ICD-10-CM | POA: Diagnosis not present

## 2024-04-29 DIAGNOSIS — D2262 Melanocytic nevi of left upper limb, including shoulder: Secondary | ICD-10-CM | POA: Diagnosis not present

## 2024-04-29 DIAGNOSIS — D2272 Melanocytic nevi of left lower limb, including hip: Secondary | ICD-10-CM | POA: Diagnosis not present

## 2024-04-29 DIAGNOSIS — D2261 Melanocytic nevi of right upper limb, including shoulder: Secondary | ICD-10-CM | POA: Diagnosis not present

## 2024-04-29 DIAGNOSIS — Z85828 Personal history of other malignant neoplasm of skin: Secondary | ICD-10-CM | POA: Diagnosis not present

## 2024-04-29 DIAGNOSIS — D2271 Melanocytic nevi of right lower limb, including hip: Secondary | ICD-10-CM | POA: Diagnosis not present

## 2024-04-29 DIAGNOSIS — Z08 Encounter for follow-up examination after completed treatment for malignant neoplasm: Secondary | ICD-10-CM | POA: Diagnosis not present

## 2024-04-29 DIAGNOSIS — L820 Inflamed seborrheic keratosis: Secondary | ICD-10-CM | POA: Diagnosis not present

## 2024-04-29 DIAGNOSIS — L821 Other seborrheic keratosis: Secondary | ICD-10-CM | POA: Diagnosis not present

## 2024-04-30 DIAGNOSIS — G8929 Other chronic pain: Secondary | ICD-10-CM | POA: Diagnosis not present

## 2024-04-30 DIAGNOSIS — M5442 Lumbago with sciatica, left side: Secondary | ICD-10-CM | POA: Diagnosis not present

## 2024-05-01 ENCOUNTER — Ambulatory Visit
Admission: RE | Admit: 2024-05-01 | Discharge: 2024-05-01 | Disposition: A | Discharge status: Home / Self Care | Source: Ambulatory Visit | Attending: Orthopedic Surgery | Admitting: Orthopedic Surgery

## 2024-05-01 DIAGNOSIS — M47816 Spondylosis without myelopathy or radiculopathy, lumbar region: Secondary | ICD-10-CM | POA: Diagnosis not present

## 2024-05-01 DIAGNOSIS — M4805 Spinal stenosis, thoracolumbar region: Secondary | ICD-10-CM | POA: Diagnosis not present

## 2024-05-01 DIAGNOSIS — M5136 Other intervertebral disc degeneration, lumbar region with discogenic back pain only: Secondary | ICD-10-CM | POA: Diagnosis not present

## 2024-05-01 DIAGNOSIS — M4807 Spinal stenosis, lumbosacral region: Secondary | ICD-10-CM | POA: Diagnosis not present

## 2024-05-06 ENCOUNTER — Other Ambulatory Visit: Payer: Self-pay | Admitting: Psychiatry

## 2024-05-06 DIAGNOSIS — M5442 Lumbago with sciatica, left side: Secondary | ICD-10-CM | POA: Diagnosis not present

## 2024-05-06 DIAGNOSIS — G8929 Other chronic pain: Secondary | ICD-10-CM | POA: Diagnosis not present

## 2024-05-06 DIAGNOSIS — F319 Bipolar disorder, unspecified: Secondary | ICD-10-CM

## 2024-05-15 DIAGNOSIS — M47816 Spondylosis without myelopathy or radiculopathy, lumbar region: Secondary | ICD-10-CM | POA: Diagnosis not present

## 2024-05-15 DIAGNOSIS — M25552 Pain in left hip: Secondary | ICD-10-CM | POA: Diagnosis not present

## 2024-05-15 DIAGNOSIS — M549 Dorsalgia, unspecified: Secondary | ICD-10-CM | POA: Diagnosis not present

## 2024-05-15 DIAGNOSIS — M5416 Radiculopathy, lumbar region: Secondary | ICD-10-CM | POA: Diagnosis not present

## 2024-05-17 ENCOUNTER — Other Ambulatory Visit: Payer: Self-pay | Admitting: Psychiatry

## 2024-05-17 DIAGNOSIS — F319 Bipolar disorder, unspecified: Secondary | ICD-10-CM

## 2024-05-20 DIAGNOSIS — G8929 Other chronic pain: Secondary | ICD-10-CM | POA: Diagnosis not present

## 2024-05-20 DIAGNOSIS — M5442 Lumbago with sciatica, left side: Secondary | ICD-10-CM | POA: Diagnosis not present

## 2024-05-21 DIAGNOSIS — N183 Chronic kidney disease, stage 3 unspecified: Secondary | ICD-10-CM | POA: Diagnosis not present

## 2024-05-21 DIAGNOSIS — E7800 Pure hypercholesterolemia, unspecified: Secondary | ICD-10-CM | POA: Diagnosis not present

## 2024-05-21 DIAGNOSIS — I129 Hypertensive chronic kidney disease with stage 1 through stage 4 chronic kidney disease, or unspecified chronic kidney disease: Secondary | ICD-10-CM | POA: Diagnosis not present

## 2024-05-21 DIAGNOSIS — E1122 Type 2 diabetes mellitus with diabetic chronic kidney disease: Secondary | ICD-10-CM | POA: Diagnosis not present

## 2024-05-21 DIAGNOSIS — N1831 Chronic kidney disease, stage 3a: Secondary | ICD-10-CM | POA: Diagnosis not present

## 2024-05-27 DIAGNOSIS — M5442 Lumbago with sciatica, left side: Secondary | ICD-10-CM | POA: Diagnosis not present

## 2024-05-27 DIAGNOSIS — G8929 Other chronic pain: Secondary | ICD-10-CM | POA: Diagnosis not present

## 2024-07-19 ENCOUNTER — Ambulatory Visit: Payer: Medicare HMO | Admitting: Internal Medicine

## 2024-07-22 ENCOUNTER — Ambulatory Visit: Admitting: Primary Care

## 2024-07-22 ENCOUNTER — Encounter: Payer: Self-pay | Admitting: Primary Care

## 2024-07-22 VITALS — BP 115/50 | HR 102 | Ht 68.0 in | Wt 272.6 lb

## 2024-07-22 DIAGNOSIS — G4733 Obstructive sleep apnea (adult) (pediatric): Secondary | ICD-10-CM

## 2024-07-22 DIAGNOSIS — Z87891 Personal history of nicotine dependence: Secondary | ICD-10-CM

## 2024-07-22 DIAGNOSIS — Z6841 Body Mass Index (BMI) 40.0 and over, adult: Secondary | ICD-10-CM

## 2024-07-22 DIAGNOSIS — J209 Acute bronchitis, unspecified: Secondary | ICD-10-CM | POA: Diagnosis not present

## 2024-07-22 DIAGNOSIS — E669 Obesity, unspecified: Secondary | ICD-10-CM

## 2024-07-22 DIAGNOSIS — G4734 Idiopathic sleep related nonobstructive alveolar hypoventilation: Secondary | ICD-10-CM

## 2024-07-22 MED ORDER — ALBUTEROL SULFATE HFA 108 (90 BASE) MCG/ACT IN AERS: 2.0000 | INHALATION_SPRAY | Freq: Four times a day (QID) | RESPIRATORY_TRACT | 0 refills | Status: AC | PRN

## 2024-07-22 MED ORDER — DOXYCYCLINE HYCLATE 100 MG PO TABS: 100.0000 mg | ORAL_TABLET | Freq: Two times a day (BID) | ORAL | 0 refills | Status: AC

## 2024-07-22 NOTE — Patient Instructions (Addendum)
" °  VISIT SUMMARY: During your visit, we discussed your persistent cough, sleep apnea, and weight management. You have been experiencing a persistent cough for over a month, and we have started treatment for acute bronchitis. We also reviewed your sleep apnea management and discussed potential future treatments. Additionally, we talked about your weight loss progress and potential changes to your medication to help with further weight loss.  YOUR PLAN: -ACUTE BRONCHITIS: Acute bronchitis is an inflammation of the bronchial tubes in the lungs, often causing a persistent cough. We have prescribed an antibiotic to treat the bronchitis and an albuterol  inhaler to use every six hours as needed for shortness of breath. Please monitor your symptoms for the next 5-7 days, and if there is no improvement, we may need to consider a chest x-ray.  -OBSTRUCTIVE SLEEP APNEA: Obstructive sleep apnea is a condition where the airway becomes blocked during sleep, causing breathing pauses. You are currently using a BiPAP machine with good compliance, and your apnea is well-controlled. We discussed the potential for Inspire therapy in the future if you can reduce your BMI below 40. Continue using your BiPAP machine and work on weight loss to qualify for Inspire therapy. We also provided you with a handout on Inspire therapy for your reference.  -OBESITY: Obesity is a condition characterized by excessive body weight. You have successfully lost 30 pounds with the help of Ozempic, but your weight loss has plateaued. We discussed the possibility of switching to Zepbound for better tolerance and efficacy. You expressed concern about medication side effects and prefer to continue with your current regimen. We encourage you to aim for a weight loss of 12 more pounds to achieve a BMI below 40. We provided you with a handout on Zepbound for your consideration and advised you to discuss this potential switch with your primary care  doctor.  INSTRUCTIONS: Please follow the prescribed treatment for acute bronchitis and use the albuterol  inhaler as needed. Monitor your symptoms for the next 5-7 days and contact us  if there is no improvement. Continue using your BiPAP machine for sleep apnea and work on weight loss to potentially qualify for Inspire therapy in the future. Discuss the possibility of switching from Ozempic to Zepbound with your primary care doctor for better weight management and diabetes control.  Follow-up 4 months with Beth NP or sooner if needed (goal weight 260lb)  "

## 2024-07-22 NOTE — Progress Notes (Signed)
 "  @Patient  ID: Carl Melton, male    DOB: Feb 20, 1956, 69 y.o.   MRN: [id number]  Chief Complaint  Patient presents with   Sleep Apnea    On Bipap.  Sleeping well.  Usually gets up 3-4 times per night to go to the [place].  Has seen urology and had an uplift done and that has helped some.    Referring provider: Lenon Layman ORN, MD  HPI: 69 year old. PMH significant for OSA on BIPAP, LVH, type 2 diabetes, insomnia, obesity, chronic pain syndrome.   Split Sleep Study ([facility] 05/23/17)- AHI 105.7/ hr, desaturation to 72 %, CPAP to 16, body weight 300 lbs)  07/22/2024 Discussed the use of AI scribe software for clinical note transcription with the patient, who gave verbal consent to proceed.  History of Present Illness Carl Melton is a 70 year old male with sleep apnea who presents with a persistent cough.  He has been experiencing a persistent cough for over a month, describing it as 'kind of loose up top' but non-productive. Despite using Mucinex, there has been no significant relief. The cough began approximately six weeks ago.  He has a history of severe sleep apnea, diagnosed in 2018 with an apnea score of 105. He uses a BiPAP machine, which was replaced in 2023. He reported that last night he had the best sleep he has had in months, waking up only once during the night. He has lost about 30 pounds since his initial diagnosis, currently weighing 272 pounds with a BMI of 41.  He underwent a Urolift procedure about six weeks ago to address frequent urination, which has reduced his nighttime awakenings from four times to two times per night.  He is diabetic and has been on Ozempic, which helped him lose 30 pounds, but the weight loss has plateaued over the past month. He experienced gastrointestinal side effects, including nausea, vomiting, and diarrhea, when the dose was increased to 2 mg, so he is currently on 1 mg.  Occasional shortness of breath is noted. He does not use  humidification with his BiPAP machine and admits to not cleaning it as thoroughly as recommended. He does not have an albuterol  inhaler.      Allergies[1]  Immunization History  Administered Date(s) Administered   Fluad Quad(high Dose 65+) 06/26/2024   Influenza Inj Mdck Quad Pf 04/11/2022   Influenza,inj,Quad PF,6+ Mos 03/27/2019, 04/28/2021   Influenza-Unspecified 05/09/2017, 05/03/2018, 03/27/2019, 04/08/2020   Moderna Sars-Covid-2 Vaccination 09/07/2019, 10/05/2019, 06/22/2020   PNEUMOCOCCAL CONJUGATE-20 07/19/2022    Past Medical History:  Diagnosis Date   Bipolar 1 disorder (HCC)    Depression    Dysrhythmia    Hyperlipidemia    Hypertension    Hypothyroidism    LVH (left ventricular hypertrophy)    Morbid obesity (HCC)    OSA on CPAP    Steatohepatitis    T2DM (type 2 diabetes mellitus) (HCC)     Tobacco History: Tobacco Use History[2] Counseling given: Not Answered   Outpatient Medications Prior to Visit  Medication Sig Dispense Refill   aMILoride  (MIDAMOR ) 5 MG tablet Take 1 tablet (5 mg total) by mouth daily. 30 tablet 11   atorvastatin (LIPITOR) 80 MG tablet Take 1 tablet by mouth at bedtime.     Continuous Glucose Sensor (FREESTYLE LIBRE 3 PLUS SENSOR) MISC SMARTSIG:1 Each Once a Week     felodipine (PLENDIL) 5 MG 24 hr tablet 5 mg at bedtime.  4   JARDIANCE 25  MG TABS tablet Take 25 mg by mouth daily.     LANTUS SOLOSTAR 100 UNIT/ML Solostar Pen Inject into the skin at bedtime.     levothyroxine (SYNTHROID) 100 MCG tablet Take 100 mcg by mouth at bedtime.     lisinopril (PRINIVIL,ZESTRIL) 20 MG tablet Take 20 mg by mouth at bedtime.  5   lithium  carbonate (ESKALITH ) 450 MG ER tablet TAKE 2 TABLETS (900 MG TOTAL) BY MOUTH AT BEDTIME. *NDC NOT COVD* 180 tablet 1   metFORMIN (GLUCOPHAGE-XR) 500 MG 24 hr tablet Take 1,500 mg by mouth daily.  5   methocarbamol (ROBAXIN) 500 MG tablet take 1 tablet at bed time as needed     risperiDONE  (RISPERDAL ) 1 MG tablet  TAKE 1 TABLET BY MOUTH AT BEDTIME. 90 tablet 1   Semaglutide (OZEMPIC, 1 MG/DOSE, [place]) Inject 1 mg into the skin once a week. Monday's     tamsulosin  (FLOMAX ) 0.4 MG CAPS capsule Take by mouth.     XIGDUO XR 04-999 MG TB24 Take 1 tablet by mouth every morning.     Facility-Administered Medications Prior to Visit  Medication Dose Route Frequency Provider Last Rate Last Admin   acetaminophen  (TYLENOL ) tablet 500 mg  500 mg Oral Once Ratcliffe, Powell SAUNDERS, PA-C          Review of Systems  Review of Systems  Constitutional: Negative.   HENT:  Positive for congestion.   Respiratory:  Positive for cough. Negative for shortness of breath and wheezing.   Cardiovascular: Negative.    Physical Exam  BP (!) 115/50 (BP Location: Left Arm, Patient Position: Sitting, Cuff Size: Large)   Pulse (!) 102   Ht 5' 8 (1.727 m)   Wt 272 lb 9.6 oz (123.7 kg)   SpO2 93% Comment: RA  BMI 41.45 kg/m  Physical Exam Constitutional:      General: He is not in acute distress.    Appearance: Normal appearance. He is well-developed. He is obese. He is not ill-appearing.  HENT:     Head: Normocephalic and atraumatic.     Mouth/Throat:     Mouth: Mucous membranes are moist.     Pharynx: Oropharynx is clear.  Cardiovascular:     Rate and Rhythm: Normal rate and regular rhythm.     Heart sounds: Normal heart sounds.  Pulmonary:     Effort: Pulmonary effort is normal. No respiratory distress.     Breath sounds: Normal breath sounds. No wheezing, rhonchi or rales.  Musculoskeletal:        General: Normal range of motion.     Cervical back: Normal range of motion and neck supple.  Skin:    General: Skin is warm and dry.     Findings: No erythema or rash.  Neurological:     General: No focal deficit present.     Mental Status: He is alert and oriented to person, place, and time. Mental status is at baseline.  Psychiatric:        Mood and Affect: Mood normal.        Behavior: Behavior normal.         Thought Content: Thought content normal.        Judgment: Judgment normal.      Lab Results:  CBC    Component Value Date/Time   WBC 6.8 01/01/2024 0955   RBC 4.57 01/01/2024 0955   HGB 13.8 01/01/2024 0955   HCT 41.7 01/01/2024 0955   PLT 224 01/01/2024 0955   MCV  91.2 01/01/2024 0955   MCH 30.2 01/01/2024 0955   MCHC 33.1 01/01/2024 0955   RDW 13.5 01/01/2024 0955    BMET    Component Value Date/Time   NA 138 01/01/2024 0955   NA 138 03/31/2022 1140   K 4.6 01/01/2024 0955   CL 108 01/01/2024 0955   CO2 20 (L) 01/01/2024 0955   GLUCOSE 308 (H) 01/01/2024 0955   BUN 26 (H) 01/01/2024 0955   BUN 16 03/31/2022 1140   CREATININE 1.14 01/01/2024 0955   CALCIUM 9.2 01/01/2024 0955   GFRNONAA >60 01/01/2024 0955   GFRAA 84 01/06/2020 0724    BNP No results found for: BNP  ProBNP No results found for: PROBNP  Imaging: No results found.   Assessment & Plan:    1. OSA treated with BiPAP (Primary)  2. Nocturnal hypoxemia  Assessment and Plan Assessment & Plan Acute bronchitis Cough persisting for over a month, productive but not expectorating. Occasional dyspnea. Differential includes acute bronchitis. No improvement with mucinex. - Prescribed doxycycline  for acute bronchitis/mucus burden. - Prescribed albuterol  inhaler for use every six hours as needed for dyspnea. - Advised to monitor symptoms for 5-7 days; if no improvement, will consider chest x-ray.  Obstructive sleep apnea Severe obstructive sleep apnea with an apnea score of 105. Currently using BiPAP Max IPAP 18/Min Epap 14 with good compliance and well-controlled apnea score of 5. Interested in [place] therapy but not currently a candidate due to BMI of 41. Weight loss of 30 pounds achieved with GLP medicaiton, but further weight loss needed to qualify for Inspire. Discussed potential for Inspire therapy if BMI is reduced to below 40 and apnea score is between 15 and 65. - Continue BiPAP  therapy. - Encouraged weight loss to achieve BMI below 40 to qualify for Inspire therapy. - Provided handout on Inspire therapy for future consideration. - Discussed potential switch from Ozempic to Zepbound with primary care for weight loss and diabetes management.  Obesity Current BMI of 41. Weight loss of 30 pounds achieved with Ozempic, but weight loss plateaued. Discussed potential switch to Zepbound for better tolerance and efficacy. He expressed concern about medication side effects and prefers to continue current regimen. - Encouraged weight loss of 12 pounds to achieve BMI below 40. - Provided handout on Zepbound for consideration of alternative weight loss medication. - Advised to discuss potential switch from Ozempic to Zepbound with primary care.   Almarie LELON Ferrari, NP 07/22/2024     [1]  Allergies Allergen Reactions   Erythromycin Ethylsuccinate Rash   Simvastatin Other (See Comments) and Rash  [2]  Social History Tobacco Use  Smoking Status Former   Current packs/day: 0.00   Average packs/day: 2.0 packs/day for 6.0 years (12.0 ttl pk-yrs)   Types: Cigarettes   Start date: 12/11/1973   Quit date: 12/12/1979   Years since quitting: 44.6   Passive exposure: Past  Smokeless Tobacco Never   "

## 2024-10-02 ENCOUNTER — Ambulatory Visit: Admitting: Urology

## 2024-10-15 ENCOUNTER — Ambulatory Visit: Admitting: Psychiatry

## 2024-11-19 ENCOUNTER — Ambulatory Visit: Admitting: Primary Care
